# Patient Record
Sex: Male | Born: 1945 | Race: White | Hispanic: No | Marital: Married | Smoking: Former smoker
Health system: Southern US, Community
[De-identification: ages and names within clinical notes are randomized; demographics above are authoritative.]

## PROBLEM LIST (undated history)

## (undated) DIAGNOSIS — I739 Peripheral vascular disease, unspecified: Secondary | ICD-10-CM

## (undated) DIAGNOSIS — J449 Chronic obstructive pulmonary disease, unspecified: Secondary | ICD-10-CM

## (undated) DIAGNOSIS — I2510 Atherosclerotic heart disease of native coronary artery without angina pectoris: Secondary | ICD-10-CM

## (undated) DIAGNOSIS — L409 Psoriasis, unspecified: Secondary | ICD-10-CM

## (undated) DIAGNOSIS — E785 Hyperlipidemia, unspecified: Secondary | ICD-10-CM

## (undated) DIAGNOSIS — Z87891 Personal history of nicotine dependence: Secondary | ICD-10-CM

## (undated) HISTORY — DX: Personal history of nicotine dependence: Z87.891

## (undated) HISTORY — PX: CATARACT EXTRACTION, BILATERAL: SHX1313

## (undated) HISTORY — DX: Hyperlipidemia, unspecified: E78.5

## (undated) HISTORY — DX: Chronic obstructive pulmonary disease, unspecified: J44.9

## (undated) HISTORY — DX: Peripheral vascular disease, unspecified: I73.9

## (undated) HISTORY — DX: Atherosclerotic heart disease of native coronary artery without angina pectoris: I25.10

## (undated) HISTORY — PX: SPINE SURGERY: SHX786

## (undated) HISTORY — DX: Morbid (severe) obesity due to excess calories: E66.01

---

## 2004-12-07 ENCOUNTER — Ambulatory Visit (HOSPITAL_COMMUNITY): Admission: RE | Admit: 2004-12-07 | Discharge: 2004-12-07 | Payer: Self-pay | Admitting: Neurosurgery

## 2005-01-28 ENCOUNTER — Encounter: Admission: RE | Admit: 2005-01-28 | Discharge: 2005-01-28 | Payer: Self-pay | Admitting: Neurosurgery

## 2013-05-19 ENCOUNTER — Ambulatory Visit (INDEPENDENT_AMBULATORY_CARE_PROVIDER_SITE_OTHER): Payer: Medicare HMO | Admitting: Family Medicine

## 2013-05-19 VITALS — BP 129/79 | HR 79 | Temp 99.3°F | Ht 69.0 in | Wt 204.6 lb

## 2013-05-19 DIAGNOSIS — J329 Chronic sinusitis, unspecified: Secondary | ICD-10-CM

## 2013-05-19 MED ORDER — FLUTICASONE PROPIONATE 50 MCG/ACT NA SUSP: 2.0000 | Freq: Every day | NASAL | Status: DC

## 2013-05-19 MED ORDER — AZITHROMYCIN 250 MG PO TABS: ORAL_TABLET | ORAL | Status: DC

## 2013-05-19 NOTE — Progress Notes (Signed)
Patient ID: Joseph Shields, male   DOB: 1945/11/01, 68 y.o.   MRN: [id number] SUBJECTIVE: CC: Chief Complaint  Patient presents with  . Sinusitis    been going on for a week    HPI: 1 week of head congestion and pain in the paranasal sinuses.  History reviewed. No pertinent past medical history. Past Surgical History  Procedure Laterality Date  . Spine surgery     History   Social History  . Marital Status: Married    Spouse Name: N/A    Number of Children: N/A  . Years of Education: N/A   Occupational History  . Not on file.   Social History Main Topics  . Smoking status: Former Research scientist (life sciences)  . Smokeless tobacco: Not on file  . Alcohol Use: No  . Drug Use: No  . Sexual Activity: Not on file   Other Topics Concern  . Not on file   Social History Narrative  . No narrative on file   No family history on file. No current outpatient prescriptions on file prior to visit.   No current facility-administered medications on file prior to visit.   Allergies  Allergen Reactions  . Penicillins     There is no immunization history on file for this patient. Prior to Admission medications   Medication Sig Start Date End Date Taking? Authorizing Provider  aspirin 81 MG tablet Take 81 mg by mouth daily.   Yes Historical Provider, MD     ROS: As above in the HPI. All other systems are stable or negative.  OBJECTIVE: APPEARANCE:  Patient in no acute distress.The patient appeared well nourished and normally developed. Acyanotic. Waist: VITAL SIGNS:BP 129/79  Pulse 79  Temp(Src) 99.3 F (37.4 C) (Oral)  Ht 5\' 9"  (1.753 m)  Wt 204 lb 9.6 oz (92.806 kg)  BMI 30.20 kg/m2  WM SKIN: warm and  Dry without overt rashes, tattoos and scars  HEAD and Neck: without JVD, Head and scalp: normal Eyes:No scleral icterus. Fundi normal, eye movements normal. Ears: Auricle normal, canal normal, Tympanic membranes normal, insufflation normal. Nose: nasal congestion, paranasal  tenderness. Throat: normal Neck & thyroid: normal  CHEST & LUNGS: Chest wall: normal Lungs: Clear  CVS: Reveals the PMI to be normally located. Regular rhythm, First and Second Heart sounds are normal,  absence of murmurs, rubs or gallops. Peripheral vasculature: Radial pulses: normal Dorsal pedis pulses: normal Posterior pulses: normal  ABDOMEN:  Appearance: normal Benign, no organomegaly, no masses, no Abdominal Aortic enlargement. No Guarding , no rebound. No Bruits. Bowel sounds: normal  RECTAL: N/A GU: N/A  EXTREMETIES: nonedematous.  MUSCULOSKELETAL:  Spine: normal Joints: intact  NEUROLOGIC: oriented to time,place and person; nonfocal. Strength is normal Sensory is normal Reflexes are normal Cranial Nerves are normal.  ASSESSMENT: Sinusitis nasal  PLAN:  No orders of the defined types were placed in this encounter.   Meds ordered this encounter  Medications  . aspirin 81 MG tablet    Sig: Take 81 mg by mouth daily.  [name] azithromycin (ZITHROMAX) 250 MG tablet    Sig: 2 tabs on day 1 and 1 tab on days 2 to 5    Dispense:  6 each    Refill:  0  . fluticasone (FLONASE) 50 MCG/ACT nasal spray    Sig: Place 2 sprays into both nostrils daily.    Dispense:  16 g    Refill:  1   There are no discontinued medications. Return if symptoms worsen or fail  to improve.  Jamyla Ard P. Jacelyn Grip, M.D.

## 2013-05-25 ENCOUNTER — Ambulatory Visit: Payer: Self-pay | Admitting: Physician Assistant

## 2013-06-01 ENCOUNTER — Encounter: Payer: Self-pay | Admitting: Physician Assistant

## 2013-06-01 ENCOUNTER — Ambulatory Visit (INDEPENDENT_AMBULATORY_CARE_PROVIDER_SITE_OTHER): Payer: Medicare HMO | Admitting: Physician Assistant

## 2013-06-01 VITALS — BP 155/80 | HR 65 | Temp 98.2°F | Ht 69.0 in

## 2013-06-01 DIAGNOSIS — L989 Disorder of the skin and subcutaneous tissue, unspecified: Secondary | ICD-10-CM

## 2013-06-01 NOTE — Progress Notes (Signed)
   Subjective:    Patient ID: Joseph Shields, male    DOB: [birth year], 68 y.o.   MRN: [id number]  HPI 68 y/o male presents for removal of lesion on scalp   Review of Systems Negative for rash, ttp, bleeding and irritation of lesion.     Objective:   Physical Exam Wart like hyperpigmented rough lesion on scalp resembling seborrheic keratosis. Smaller seb k's noted on face. Clothed exam.       Assessment & Plan:  1. Shave biopsy of lesion to confirm dx . R/o BCC/SCC or underlying dysplasia. Referral to dermatology if needed. Instructed patient to apply vaseline for healing. Avoid neosporin or TAO. Clean dialy with soap/water.

## 2013-06-05 LAB — PATHOLOGY

## 2013-07-24 ENCOUNTER — Telehealth: Payer: Self-pay | Admitting: Physician Assistant

## 2013-07-25 ENCOUNTER — Encounter: Payer: Self-pay | Admitting: General Practice

## 2013-07-25 ENCOUNTER — Ambulatory Visit (INDEPENDENT_AMBULATORY_CARE_PROVIDER_SITE_OTHER): Payer: Medicare HMO | Admitting: General Practice

## 2013-07-25 VITALS — BP 150/85 | HR 64 | Temp 98.0°F | Ht 69.0 in | Wt 207.0 lb

## 2013-07-25 DIAGNOSIS — L409 Psoriasis, unspecified: Secondary | ICD-10-CM

## 2013-07-25 DIAGNOSIS — L259 Unspecified contact dermatitis, unspecified cause: Secondary | ICD-10-CM

## 2013-07-25 DIAGNOSIS — L309 Dermatitis, unspecified: Secondary | ICD-10-CM

## 2013-07-25 DIAGNOSIS — L408 Other psoriasis: Secondary | ICD-10-CM

## 2013-07-25 MED ORDER — DESOXIMETASONE 0.05 % EX CREA: TOPICAL_CREAM | Freq: Two times a day (BID) | CUTANEOUS | Status: DC

## 2013-07-25 MED ORDER — DESOXIMETASONE 0.05 % EX CREA: TOPICAL_CREAM | Freq: Two times a day (BID) | CUTANEOUS | Status: AC

## 2013-07-25 NOTE — Progress Notes (Signed)
   Subjective:    Patient ID: Joseph Shields, male    DOB: 1946-02-01, 68 y.o.   MRN: [id number]  HPI Patient presents today with complaints of worsening rash. History of psoriasis. Reports psoriasis has flared up and needs referral to another dermatologist. Due to change of insurance.     Review of Systems  Constitutional: Negative for fever and chills.  Respiratory: Negative for chest tightness and shortness of breath.   Cardiovascular: Negative for chest pain and palpitations.  Skin:       psoriasis       Objective:   Physical Exam  Constitutional: He is oriented to person, place, and time. He appears well-developed and well-nourished.  Cardiovascular: Normal rate, regular rhythm and normal heart sounds.   Pulmonary/Chest: Effort normal and breath sounds normal. No respiratory distress. He exhibits no tenderness.  Neurological: He is alert and oriented to person, place, and time.  Skin: Skin is warm and dry.  Silvery, white scales with erythematous base to abdomen and back. Negative drainage.   Psychiatric: He has a normal mood and affect.          Assessment & Plan:  1. Dermatitis   2. Psoriasis  - Ambulatory referral to Dermatology - desoximetasone (TOPICORT) 0.05 % cream; Apply topically 2 (two) times daily. Do not use on face, axilla, or groin  Dispense: 30 g; Refill: 1 -patient education discussed and provided -RTO prn Patient verbalized understanding Erby Pian, FNP-C

## 2013-07-25 NOTE — Patient Instructions (Signed)
Psoriasis Psoriasis is a common, long-lasting (chronic) inflammation of the skin. It affects both men and women equally, of all ages and all races. Psoriasis cannot be passed from person to person (not contagious). Psoriasis varies from mild to very severe. When severe, it can greatly affect your quality of life. Psoriasis is an inflammatory disorder affecting the skin as well as other organs including the joints (causing an arthritis). With psoriasis, the skin sheds its top layer of cells more rapidly than it does in someone without psoriasis. CAUSES  The cause of psoriasis is largely unknown. Genetics, your immune system, and the environment seem to play a role in causing psoriasis. Factors that can make psoriasis worse include:  Damage or trauma to the skin, such as cuts, scrapes, and sunburn. This damage often causes new areas of psoriasis (lesions).  Winter dryness and lack of sunlight.  Medicines such as lithium, beta-blockers, antimalarial drugs, ACE inhibitors, nonsteroidal anti-inflammatory drugs (ibuprofen, aspirin), and terbinafine. Let your caregiver know if you are taking any of these drugs.  Alcohol. Excessive alcohol use should be avoided if you have psoriasis. Drinking large amounts of alcohol can affect:  How well your psoriasis treatment works.  How safe your psoriasis treatment is.  Smoking. If you smoke, ask your caregiver for help to quit.  Stress.  Bacterial or viral infections.  Arthritis. Arthritis associated with psoriasis (psoriatic arthritis) affects less than 10% of patients with psoriasis. The arthritic intensity does not always match the skin psoriasis intensity. It is important to let your caregiver know if your joints hurt or if they are stiff. SYMPTOMS  The most common form of psoriasis begins with little red bumps that gradually become larger. The bumps begin to form scales that flake off easily. The lower layers of scales stick together. When these scales  are scratched or removed, the underlying skin is tender and bleeds easily. These areas then grow in size and may become large. Psoriasis often creates a rash that looks the same on both sides of the body (symmetrical). It often affects the elbows, knees, groin, genitals, arms, legs, scalp, and nails. Affected nails often have pitting, loosen, thicken, crumble, and are difficult to treat.  "Inverse psoriasis"occurs in the armpits, under breasts, in skin folds, and around the groin, buttocks, and genitals.  "Guttate psoriasis" generally occurs in children and young adults following a recent sore throat (strep throat). It begins with many small, red, scaly spots on the skin. It clears spontaneously in weeks or a few months without treatment. DIAGNOSIS  Psoriasis is diagnosed by physical exam. A tissue sample (biopsy) may also be taken. TREATMENT The treatment of psoriasis depends on your age, health, and living conditions.  Steroid (cortisone) creams, lotions, and ointments may be used. These treatments are associated with thinning of the skin, blood vessels that get larger (dilated), loss of skin pigmentation, and easy bruising. It is important to use these steroids as directed by your caregiver. Only treat the affected areas and not the normal, unaffected skin. People on long-term steroid treatment should wear a medical alert bracelet. Injections may be used in areas that are difficult to treat.  Scalp treatments are available as shampoos, solutions, sprays, foams, and oils. Avoid scratching the scalp and picking at the scales.  Anthralin medicine works well on areas that are difficult to treat. However, it stains clothes and skin and may cause temporary irritation.  Synthetic vitamin D (calcipotriene)can be used on small areas. It is available by prescription. The forms   of synthetic vitamin D available in health food stores do not help with psoriasis.  Coal tarsare available in various strengths  for psoriasis that is difficult to treat. They are one of the longest used treatments for difficult to treat psoriasis. However, they are messy to use.  Light therapy (UV therapy) can be carefully and professionally monitored in a dermatologist's office. Careful sunbathing is helpful for many people as directed by your caregiver. The exposure should be just long enough to cause a mild redness (erythema) of your skin. Avoid sunburn as this may make the condition worse. Sunscreen (SPF of 30 or higher) should be used to protect against sunburn. Cataracts, wrinkles, and skin aging are some of the harmful side effects of light therapy.  If creams (topical medicines) fail, there are several other options for systemic or oral medicines your caregiver can suggest. Psoriasis can sometimes be very difficult to treat. It can come and go. It is necessary to follow up with your caregiver regularly if your psoriasis is difficult to treat. Usually, with persistence you can get a good amount of relief. Maintaining consistent care is important. Do not change caregivers just because you do not see immediate results. It may take several trials to find the right combination of treatment for you. PREVENTING FLARE-UPS  Wear gloves while you wash dishes, while cleaning, and when you are outside in the cold.  If you have radiators, place a bowl of water or damp towel on the radiator. This will help put water back in the air. You can also use a humidifier to keep the air moist. Try to keep the humidity at about 60% in your home.  Apply moisturizer while your skin is still damp from bathing or showering. This traps water in the skin.  Avoid long, hot baths or showers. Keep soap use to a minimum. Soaps dry out the skin and wash away the protective oils. Use a fragrance free, dye free soap.  Drink enough water and fluids to keep your urine clear or pale yellow. Not drinking enough water depletes your skin's water  supply.  Turn off the heat at night and keep it low during the day. Cool air is less drying. SEEK MEDICAL CARE IF:  You have increasing pain in the affected areas.  You have uncontrolled bleeding in the affected areas.  You have increasing redness or warmth in the affected areas.  You start to have pain or stiffness in your joints.  You start feeling depressed about your condition.  You have a fever. Document Released: 04/09/2000 Document Revised: 07/05/2011 Document Reviewed: 10/05/2010 [employer] Patient Information 2014 [employer].  

## 2013-07-25 NOTE — Telephone Encounter (Signed)
APPT MADE

## 2014-02-19 ENCOUNTER — Ambulatory Visit (INDEPENDENT_AMBULATORY_CARE_PROVIDER_SITE_OTHER): Payer: Medicare HMO | Admitting: Family Medicine

## 2014-02-19 ENCOUNTER — Encounter: Payer: Self-pay | Admitting: Family Medicine

## 2014-02-19 ENCOUNTER — Ambulatory Visit (INDEPENDENT_AMBULATORY_CARE_PROVIDER_SITE_OTHER): Payer: Medicare HMO

## 2014-02-19 VITALS — BP 126/74 | HR 74 | Temp 97.3°F | Ht 69.0 in | Wt 200.0 lb

## 2014-02-19 DIAGNOSIS — R05 Cough: Secondary | ICD-10-CM

## 2014-02-19 DIAGNOSIS — J209 Acute bronchitis, unspecified: Secondary | ICD-10-CM

## 2014-02-19 DIAGNOSIS — J301 Allergic rhinitis due to pollen: Secondary | ICD-10-CM

## 2014-02-19 DIAGNOSIS — R059 Cough, unspecified: Secondary | ICD-10-CM

## 2014-02-19 DIAGNOSIS — L409 Psoriasis, unspecified: Secondary | ICD-10-CM

## 2014-02-19 DIAGNOSIS — J069 Acute upper respiratory infection, unspecified: Secondary | ICD-10-CM

## 2014-02-19 MED ORDER — AZITHROMYCIN 250 MG PO TABS: ORAL_TABLET | ORAL | Status: DC

## 2014-02-19 NOTE — Progress Notes (Signed)
Subjective:    Patient ID: Joseph Shields, male    DOB: 17-Jul-1945, 68 y.o.   MRN: [id number]  HPI Patient here today for cough, congestion and runny nose that started about 1 week ago. The patient is pleasant and cooperative and has a history of psoriasis and is taking methotrexate. The patient indicates that the congestion was yellow at first and more predominant in his head. The drainage has become more clear and seems to be down in his chest more with his coughing. When he has used Flonase in the past this  causes his nose to bleed.         There are no active problems to display for this patient.  Outpatient Encounter Prescriptions as of 02/19/2014  Medication Sig  . aspirin 81 MG tablet Take 81 mg by mouth daily.  . cetirizine (ZYRTEC) 10 MG tablet Take 10 mg by mouth daily.  . folic acid (FOLVITE) 1 MG tablet Take 1 mg by mouth daily.  . methotrexate (RHEUMATREX) 2.5 MG tablet Take 7.5 mg by mouth once a week.    Review of Systems  Constitutional: Negative.  Negative for fever.  HENT: Positive for congestion and postnasal drip. Negative for sore throat.   Eyes: Negative.   Respiratory: Positive for cough.   Cardiovascular: Negative.   Gastrointestinal: Negative.   Endocrine: Negative.   Genitourinary: Negative.   Musculoskeletal: Negative.  Negative for myalgias.  Skin: Negative.   Allergic/Immunologic: Negative.   Neurological: Negative.  Negative for headaches.  Hematological: Negative.   Psychiatric/Behavioral: Negative.        Objective:   Physical Exam  Nursing note and vitals reviewed. Constitutional: He is oriented to person, place, and time. He appears well-developed and well-nourished.  HENT:  Head: Normocephalic and atraumatic.  Right Ear: External ear normal.  Left Ear: External ear normal.  Mouth/Throat: Oropharynx is clear and moist. No oropharyngeal exudate.  Minimal nasal congestion.  Eyes: Conjunctivae and EOM are normal. Pupils are equal,  round, and reactive to light. Right eye exhibits no discharge. Left eye exhibits no discharge. No scleral icterus.  Neck: Normal range of motion. Neck supple. No thyromegaly present.  No anterior cervical adenopathy or carotid bruits.  Cardiovascular: Normal rate, regular rhythm and normal heart sounds.  Exam reveals no gallop and no friction rub.   No murmur heard. Pulmonary/Chest: Effort normal. No respiratory distress. He has wheezes. He has no rales.  A few wheezes bilaterally more expiratory in nature and worse on the right. Some chest congestion on the right with coughing but no rales or rhonchi.  Abdominal: Soft. Bowel sounds are normal. He exhibits no mass. There is no tenderness. There is no rebound and no guarding.  Musculoskeletal: Normal range of motion. He exhibits no edema.  Lymphadenopathy:    He has no cervical adenopathy.  Neurological: He is alert and oriented to person, place, and time.  Skin: Skin is warm and dry. Rash noted. No erythema. No pallor.  The patient does have some small patches of psoriasis evident on his back.  Psychiatric: He has a normal mood and affect. His behavior is normal. Judgment and thought content normal.   BP 126/74  Pulse 74  Temp(Src) 97.3 F (36.3 C) (Oral)  Ht 5\' 9"  (1.753 m)  Wt 200 lb (90.719 kg)  BMI 29.52 kg/m2  [facility] reading (PRIMARY) by  Dr. Brunilda Payor x-ray--  no active disease noted, degenerative changes in spine.  Assessment & Plan:  1. Cough - DG Chest 2 View; Future - azithromycin (ZITHROMAX) 250 MG tablet; 2 pills the first day then 1 daily until completed  Dispense: 6 tablet; Refill: 0  2. URI (upper respiratory infection) - azithromycin (ZITHROMAX) 250 MG tablet; 2 pills the first day then 1 daily until completed  Dispense: 6 tablet; Refill: 0  3. Allergic rhinitis due to pollen  4. Psoriasis  5. Acute bronchitis, unspecified organism - azithromycin (ZITHROMAX) 250 MG tablet;  2 pills the first day then 1 daily until completed  Dispense: 6 tablet; Refill: 0  Patient Instructions  Drink plenty of fluids Use saline nose spray 3 or 4 times daily to each nostril Take Mucinex maximum strength, Lou and white in color, over-the-counter, 1 twice daily with a large glass of water Keep the home cooler in temperature Take antibiotic as directed   Arrie Senate MD

## 2014-02-19 NOTE — Patient Instructions (Addendum)
Drink plenty of fluids Use saline nose spray 3 or 4 times daily to each nostril Take Mucinex maximum strength, Lou and white in color, over-the-counter, 1 twice daily with a large glass of water Keep the home cooler in temperature Take antibiotic as directed  Call Roselyn Reef 1st of next week - to see how you're doing- if no better- we may start prednisone.

## 2014-02-20 ENCOUNTER — Telehealth: Payer: Self-pay

## 2014-02-20 LAB — CBC WITH DIFFERENTIAL
Basophils Absolute: 0 10*3/uL (ref 0.0–0.2)
Basos: 1 %
Eos: 4 %
Eosinophils Absolute: 0.3 10*3/uL (ref 0.0–0.4)
HCT: 41.4 % (ref 37.5–51.0)
Hemoglobin: 13.8 g/dL (ref 12.6–17.7)
Immature Grans (Abs): 0 10*3/uL (ref 0.0–0.1)
Immature Granulocytes: 0 %
Lymphocytes Absolute: 1.9 10*3/uL (ref 0.7–3.1)
Lymphs: 23 %
MCH: 31.8 pg (ref 26.6–33.0)
MCHC: 33.3 g/dL (ref 31.5–35.7)
MCV: 95 fL (ref 79–97)
Monocytes Absolute: 1.2 10*3/uL — ABNORMAL HIGH (ref 0.1–0.9)
Monocytes: 15 %
Neutrophils Absolute: 4.6 10*3/uL (ref 1.4–7.0)
Neutrophils Relative %: 57 %
Platelets: 236 10*3/uL (ref 150–379)
RBC: 4.34 x10E6/uL (ref 4.14–5.80)
RDW: 14.3 % (ref 12.3–15.4)
WBC: 8 10*3/uL (ref 3.4–10.8)

## 2014-02-20 NOTE — Telephone Encounter (Signed)
Message copied by Koren Bound on Wed Feb 20, 2014  8:14 AM ------      Message from: Chipper Herb      Created: Tue Feb 19, 2014  2:34 PM       As per radiology report ------

## 2014-02-20 NOTE — Telephone Encounter (Signed)
Pt aware of CXR results.

## 2014-04-09 ENCOUNTER — Other Ambulatory Visit: Payer: Self-pay | Admitting: Family Medicine

## 2014-04-09 NOTE — Telephone Encounter (Signed)
Last seen 02/19/14  DWM

## 2014-04-11 ENCOUNTER — Encounter: Payer: Self-pay | Admitting: Nurse Practitioner

## 2014-04-11 ENCOUNTER — Ambulatory Visit (INDEPENDENT_AMBULATORY_CARE_PROVIDER_SITE_OTHER): Payer: Medicare HMO | Admitting: Nurse Practitioner

## 2014-04-11 ENCOUNTER — Telehealth: Payer: Self-pay | Admitting: Family Medicine

## 2014-04-11 VITALS — BP 127/76 | HR 95 | Temp 100.8°F | Ht 66.0 in | Wt 197.0 lb

## 2014-04-11 DIAGNOSIS — J189 Pneumonia, unspecified organism: Secondary | ICD-10-CM

## 2014-04-11 DIAGNOSIS — J069 Acute upper respiratory infection, unspecified: Secondary | ICD-10-CM

## 2014-04-11 MED ORDER — AZITHROMYCIN 250 MG PO TABS: ORAL_TABLET | ORAL | Status: DC

## 2014-04-11 MED ORDER — HYDROCODONE-HOMATROPINE 5-1.5 MG/5ML PO SYRP: 5.0000 mL | ORAL_SOLUTION | Freq: Three times a day (TID) | ORAL | Status: DC | PRN

## 2014-04-11 NOTE — Progress Notes (Signed)
Subjective:    Patient ID: Joseph Shields, male    DOB: June 22, 1945, 68 y.o.   MRN: [id number]  HPI patient in c/o cough, chest congestion and fever that started 3 days ago. He has tried oTC meds with  No relief.    Review of Systems  Constitutional: Positive for fever and chills. Negative for appetite change.  HENT: Positive for congestion, rhinorrhea and sinus pressure. Negative for ear pain.   Respiratory: Positive for cough (productive).   Cardiovascular: Negative.   Gastrointestinal: Negative.   Genitourinary: Negative.   Neurological: Negative.   Psychiatric/Behavioral: Negative.   All other systems reviewed and are negative.      Objective:   Physical Exam  Constitutional: He is oriented to person, place, and time. He appears well-developed and well-nourished. No distress.  HENT:  Right Ear: Hearing, tympanic membrane, external ear and ear canal normal.  Left Ear: Hearing, tympanic membrane, external ear and ear canal normal.  Nose: Mucosal edema and rhinorrhea present. Right sinus exhibits no maxillary sinus tenderness and no frontal sinus tenderness. Left sinus exhibits no maxillary sinus tenderness and no frontal sinus tenderness.  Mouth/Throat: Uvula is midline, oropharynx is clear and moist and mucous membranes are normal.  Eyes: Pupils are equal, round, and reactive to light.  Neck: Normal range of motion. Neck supple.  Cardiovascular: Normal rate and normal heart sounds.   Pulmonary/Chest: Effort normal. He has rales (right lower lobe).  Diminished breath sounds right lower lobe  Abdominal: Soft. Bowel sounds are normal.  Lymphadenopathy:    He has no cervical adenopathy.  Neurological: He is alert and oriented to person, place, and time.  Skin: Skin is warm.  Psychiatric: He has a normal mood and affect. His behavior is normal. Judgment and thought content normal.    BP 127/76 mmHg  Pulse 95  Temp(Src) 100.8 F (38.2 C) (Oral)  Ht 5\' 6"  (1.676 m)  Wt 197  lb (89.359 kg)  BMI 31.81 kg/m2       Assessment & Plan:   1. Acute upper respiratory infection   2. Pneumonia, organism unspecified    Meds ordered this encounter  Medications  . azithromycin (ZITHROMAX Z-PAK) 250 MG tablet    Sig: As directed    Dispense:  6 each    Refill:  0    Order Specific Question:  Supervising Provider    Answer:  Chipper Herb [[id number]]  . HYDROcodone-homatropine (HYCODAN) 5-1.5 MG/5ML syrup    Sig: Take 5 mLs by mouth every 8 (eight) hours as needed for cough.    Dispense:  120 mL    Refill:  0    Order Specific Question:  Supervising Provider    Answer:  Chipper Herb [[id number]]   1. Take meds as prescribed 2. Use a cool mist humidifier especially during the winter months and when heat has been humid. 3. Use saline nose sprays frequently 4. Saline irrigations of the nose can be very helpful if done frequently.  * 4X daily for 1 week*  * Use of a nettie pot can be helpful with this. Follow directions with this* 5. Drink plenty of fluids 6. Keep thermostat turn down low 7.For any cough or congestion  Use plain Mucinex- regular strength or max strength is fine   * Children- consult with Pharmacist for dosing 8. For fever or aces or pains- take tylenol or ibuprofen appropriate for age and weight.  * for fevers greater than 101 orally you may alternate  ibuprofen and tylenol every  3 hours.   Mary-Margaret Hassell Done, FNP

## 2014-04-11 NOTE — Telephone Encounter (Signed)
Appointment given for tonight at 6:15 with Ronnald Collum, FNP

## 2014-04-11 NOTE — Patient Instructions (Signed)

## 2014-04-13 ENCOUNTER — Emergency Department (HOSPITAL_COMMUNITY): Payer: Medicare HMO

## 2014-04-13 ENCOUNTER — Encounter (HOSPITAL_COMMUNITY): Payer: Self-pay | Admitting: *Deleted

## 2014-04-13 ENCOUNTER — Inpatient Hospital Stay (HOSPITAL_COMMUNITY)
Admission: EM | Admit: 2014-04-13 | Discharge: 2014-04-15 | DRG: 871 | Disposition: A | Discharge status: Home / Self Care | Payer: Medicare HMO | Attending: Internal Medicine | Admitting: Internal Medicine

## 2014-04-13 DIAGNOSIS — J440 Chronic obstructive pulmonary disease with acute lower respiratory infection: Secondary | ICD-10-CM | POA: Diagnosis present

## 2014-04-13 DIAGNOSIS — J189 Pneumonia, unspecified organism: Secondary | ICD-10-CM | POA: Diagnosis present

## 2014-04-13 DIAGNOSIS — A419 Sepsis, unspecified organism: Principal | ICD-10-CM | POA: Diagnosis present

## 2014-04-13 DIAGNOSIS — Z792 Long term (current) use of antibiotics: Secondary | ICD-10-CM

## 2014-04-13 DIAGNOSIS — R059 Cough, unspecified: Secondary | ICD-10-CM

## 2014-04-13 DIAGNOSIS — J129 Viral pneumonia, unspecified: Secondary | ICD-10-CM | POA: Diagnosis present

## 2014-04-13 DIAGNOSIS — L409 Psoriasis, unspecified: Secondary | ICD-10-CM | POA: Diagnosis present

## 2014-04-13 DIAGNOSIS — R7989 Other specified abnormal findings of blood chemistry: Secondary | ICD-10-CM

## 2014-04-13 DIAGNOSIS — R0602 Shortness of breath: Secondary | ICD-10-CM

## 2014-04-13 DIAGNOSIS — I214 Non-ST elevation (NSTEMI) myocardial infarction: Secondary | ICD-10-CM | POA: Diagnosis present

## 2014-04-13 DIAGNOSIS — F17210 Nicotine dependence, cigarettes, uncomplicated: Secondary | ICD-10-CM | POA: Diagnosis present

## 2014-04-13 DIAGNOSIS — E872 Acidosis: Secondary | ICD-10-CM | POA: Diagnosis present

## 2014-04-13 DIAGNOSIS — I10 Essential (primary) hypertension: Secondary | ICD-10-CM | POA: Diagnosis present

## 2014-04-13 DIAGNOSIS — J209 Acute bronchitis, unspecified: Secondary | ICD-10-CM | POA: Diagnosis present

## 2014-04-13 DIAGNOSIS — R0902 Hypoxemia: Secondary | ICD-10-CM | POA: Diagnosis present

## 2014-04-13 DIAGNOSIS — R6510 Systemic inflammatory response syndrome (SIRS) of non-infectious origin without acute organ dysfunction: Secondary | ICD-10-CM | POA: Insufficient documentation

## 2014-04-13 DIAGNOSIS — J9601 Acute respiratory failure with hypoxia: Secondary | ICD-10-CM | POA: Diagnosis present

## 2014-04-13 DIAGNOSIS — Z7982 Long term (current) use of aspirin: Secondary | ICD-10-CM

## 2014-04-13 DIAGNOSIS — R05 Cough: Secondary | ICD-10-CM | POA: Insufficient documentation

## 2014-04-13 DIAGNOSIS — R778 Other specified abnormalities of plasma proteins: Secondary | ICD-10-CM | POA: Diagnosis present

## 2014-04-13 DIAGNOSIS — Z880 Allergy status to penicillin: Secondary | ICD-10-CM

## 2014-04-13 DIAGNOSIS — J441 Chronic obstructive pulmonary disease with (acute) exacerbation: Secondary | ICD-10-CM | POA: Diagnosis present

## 2014-04-13 HISTORY — DX: Psoriasis, unspecified: L40.9

## 2014-04-13 LAB — CK TOTAL AND CKMB (NOT AT ARMC)
CK TOTAL: 428 U/L — AB (ref 7–232)
CK, MB: 29 ng/mL — AB (ref 0.3–4.0)
Relative Index: 6.8 — ABNORMAL HIGH (ref 0.0–2.5)

## 2014-04-13 LAB — CBC WITH DIFFERENTIAL/PLATELET
Basophils Absolute: 0 10*3/uL (ref 0.0–0.1)
Basophils Relative: 0 % (ref 0–1)
Eosinophils Absolute: 0.1 10*3/uL (ref 0.0–0.7)
Eosinophils Relative: 1 % (ref 0–5)
HCT: 39.6 % (ref 39.0–52.0)
Hemoglobin: 13.1 g/dL (ref 13.0–17.0)
Lymphocytes Relative: 6 % — ABNORMAL LOW (ref 12–46)
Lymphs Abs: 1.2 10*3/uL (ref 0.7–4.0)
MCH: 31.3 pg (ref 26.0–34.0)
MCHC: 33.1 g/dL (ref 30.0–36.0)
MCV: 94.7 fL (ref 78.0–100.0)
Monocytes Absolute: 4.1 10*3/uL — ABNORMAL HIGH (ref 0.1–1.0)
Monocytes Relative: 20 % — ABNORMAL HIGH (ref 3–12)
Neutro Abs: 14.9 10*3/uL — ABNORMAL HIGH (ref 1.7–7.7)
Neutrophils Relative %: 73 % (ref 43–77)
Platelets: 254 10*3/uL (ref 150–400)
RBC: 4.18 MIL/uL — ABNORMAL LOW (ref 4.22–5.81)
RDW: 13.4 % (ref 11.5–15.5)
WBC: 20.3 10*3/uL — ABNORMAL HIGH (ref 4.0–10.5)

## 2014-04-13 LAB — COMPREHENSIVE METABOLIC PANEL
ALT: 23 U/L (ref 0–53)
AST: 28 U/L (ref 0–37)
Albumin: 3.3 g/dL — ABNORMAL LOW (ref 3.5–5.2)
Alkaline Phosphatase: 90 U/L (ref 39–117)
Anion gap: 14 (ref 5–15)
BUN: 17 mg/dL (ref 6–23)
CO2: 25 mEq/L (ref 19–32)
Calcium: 9.4 mg/dL (ref 8.4–10.5)
Chloride: 99 mEq/L (ref 96–112)
Creatinine, Ser: 0.87 mg/dL (ref 0.50–1.35)
GFR calc Af Amer: >90 mL/min (ref >90)
GFR calc non Af Amer: 87 mL/min — ABNORMAL LOW (ref >90)
Glucose, Bld: 113 mg/dL — ABNORMAL HIGH (ref 70–99)
Potassium: 4.2 mEq/L (ref 3.7–5.3)
Sodium: 138 mEq/L (ref 137–147)
Total Bilirubin: 0.5 mg/dL (ref 0.3–1.2)
Total Protein: 7.2 g/dL (ref 6.0–8.3)

## 2014-04-13 LAB — TROPONIN I
Troponin I: 3.79 ng/mL (ref <0.30)
Troponin I: 6.93 ng/mL (ref <0.30)

## 2014-04-13 MED ORDER — HEPARIN BOLUS VIA INFUSION
4000.0000 [IU] | Freq: Once | INTRAVENOUS | Status: AC
  Administered 2014-04-13: 4000 [IU] via INTRAVENOUS
  Filled 2014-04-13: qty 4000

## 2014-04-13 MED ORDER — ASPIRIN 325 MG PO TABS
325.0000 mg | ORAL_TABLET | ORAL | Status: AC
  Administered 2014-04-13: 325 mg via ORAL
  Filled 2014-04-13: qty 1

## 2014-04-13 MED ORDER — DEXTROSE 5 % IV SOLN
1.0000 g | Freq: Once | INTRAVENOUS | Status: AC
  Administered 2014-04-13: 1 g via INTRAVENOUS
  Filled 2014-04-13: qty 10

## 2014-04-13 MED ORDER — DEXTROSE 5 % IV SOLN
500.0000 mg | Freq: Once | INTRAVENOUS | Status: AC
  Administered 2014-04-13: 500 mg via INTRAVENOUS
  Filled 2014-04-13: qty 500

## 2014-04-13 MED ORDER — ALBUTEROL SULFATE (2.5 MG/3ML) 0.083% IN NEBU
5.0000 mg | INHALATION_SOLUTION | Freq: Once | RESPIRATORY_TRACT | Status: AC
  Administered 2014-04-13: 5 mg via RESPIRATORY_TRACT
  Filled 2014-04-13: qty 6

## 2014-04-13 MED ORDER — IOHEXOL 350 MG/ML SOLN
100.0000 mL | Freq: Once | INTRAVENOUS | Status: AC | PRN
  Administered 2014-04-13: 100 mL via INTRAVENOUS

## 2014-04-13 MED ORDER — HEPARIN (PORCINE) IN NACL 100-0.45 UNIT/ML-% IJ SOLN
1400.0000 [IU]/h | INTRAMUSCULAR | Status: DC
  Administered 2014-04-13: 1000 [IU]/h via INTRAVENOUS
  Administered 2014-04-14: 1400 [IU]/h via INTRAVENOUS
  Filled 2014-04-13 (×2): qty 250

## 2014-04-13 NOTE — ED Provider Notes (Signed)
CSN: [id number]     Arrival date & time 04/13/14  1709 History   First MD Initiated Contact with Patient 04/13/14 1716     Chief Complaint  Patient presents with  . Shortness of Breath  . Cough     (Consider location/radiation/quality/duration/timing/severity/associated sxs/prior Treatment) Patient is a 68 y.o. male presenting with shortness of breath and cough. The history is provided by the patient.  Shortness of Breath Severity:  Mild Onset quality:  Gradual Duration:  1 week Timing:  Constant Progression:  Worsening Chronicity:  New Context: URI   Relieved by:  Nothing Worsened by:  Nothing tried Ineffective treatments:  None tried Associated symptoms: cough   Associated symptoms: no abdominal pain, no chest pain, no fever, no headaches, no neck pain and no vomiting   Cough:    Cough characteristics:  Productive   Sputum characteristics:  Yellow   Severity:  Moderate   Onset quality:  Gradual   Duration:  1 week   Timing:  Constant   Progression:  Worsening   Chronicity:  New Cough Associated symptoms: shortness of breath   Associated symptoms: no chest pain, no fever, no headaches and no rhinorrhea     History reviewed. No pertinent past medical history. Past Surgical History  Procedure Laterality Date  . Spine surgery     History reviewed. No pertinent family history. History  Substance Use Topics  . Smoking status: Former Research scientist (life sciences)  . Smokeless tobacco: Not on file  . Alcohol Use: No    Review of Systems  Constitutional: Negative for fever.  HENT: Negative for drooling and rhinorrhea.   Eyes: Negative for pain.  Respiratory: Positive for cough and shortness of breath.   Cardiovascular: Negative for chest pain and leg swelling.  Gastrointestinal: Negative for nausea, vomiting, abdominal pain and diarrhea.  Genitourinary: Negative for dysuria and hematuria.  Musculoskeletal: Negative for gait problem and neck pain.  Skin: Negative for color change.   Neurological: Negative for numbness and headaches.  Hematological: Negative for adenopathy.  Psychiatric/Behavioral: Negative for behavioral problems.  All other systems reviewed and are negative.     Allergies  Flonase and Penicillins  Home Medications   Prior to Admission medications   Medication Sig Start Date End Date Taking? Authorizing Provider  aspirin 81 MG tablet Take 81 mg by mouth daily.    Historical Provider, MD  azithromycin (ZITHROMAX Z-PAK) 250 MG tablet As directed 04/11/14   Mary-Margaret Hassell Done, FNP  azithromycin (ZITHROMAX) 250 MG tablet 2 pills the first day then 1 daily until completed Patient not taking: Reported on 04/11/2014 02/19/14   Chipper Herb, MD  cetirizine (ZYRTEC) 10 MG tablet Take 10 mg by mouth daily.    Historical Provider, MD  folic acid (FOLVITE) 1 MG tablet Take 1 mg by mouth daily.    Historical Provider, MD  HYDROcodone-homatropine (HYCODAN) 5-1.5 MG/5ML syrup Take 5 mLs by mouth every 8 (eight) hours as needed for cough. 04/11/14   Mary-Margaret Hassell Done, FNP  methotrexate (RHEUMATREX) 2.5 MG tablet Take 7.5 mg by mouth once a week.    Historical Provider, MD  methotrexate (RHEUMATREX) 2.5 MG tablet  04/01/14   Historical Provider, MD   BP 131/69 mmHg  Pulse 103  Temp(Src) 98.2 F (36.8 C) (Oral)  Resp 22  SpO2 94% Physical Exam  Constitutional: He is oriented to person, place, and time. He appears well-developed and well-nourished.  HENT:  Head: Normocephalic and atraumatic.  Right Ear: External ear normal.  Left Ear: External  ear normal.  Nose: Nose normal.  Mouth/Throat: Oropharynx is clear and moist. No oropharyngeal exudate.  Eyes: Conjunctivae and EOM are normal. Pupils are equal, round, and reactive to light.  Neck: Normal range of motion. Neck supple.  Cardiovascular: Normal rate, regular rhythm, normal heart sounds and intact distal pulses.  Exam reveals no gallop and no friction rub.   No murmur heard. Pulmonary/Chest:  He has no wheezes.  Mild tachypnea. Diminished breath sounds bilaterally. No obvious wheezing heard.  Abdominal: Soft. Bowel sounds are normal. He exhibits no distension. There is no tenderness. There is no rebound and no guarding.  Musculoskeletal: Normal range of motion. He exhibits no edema or tenderness.  Neurological: He is alert and oriented to person, place, and time.  Skin: Skin is warm and dry.  Psychiatric: He has a normal mood and affect. His behavior is normal.  Nursing note and vitals reviewed.   ED Course  Procedures (including critical care time) Labs Review Labs Reviewed  CBC WITH DIFFERENTIAL - Abnormal; Notable for the following:    WBC 20.3 (*)    RBC 4.18 (*)    Neutro Abs 14.9 (*)    Lymphocytes Relative 6 (*)    Monocytes Relative 20 (*)    Monocytes Absolute 4.1 (*)    All other components within normal limits  COMPREHENSIVE METABOLIC PANEL - Abnormal; Notable for the following:    Glucose, Bld 113 (*)    Albumin 3.3 (*)    GFR calc non Af Amer 87 (*)    All other components within normal limits  TROPONIN I - Abnormal; Notable for the following:    Troponin I 3.79 (*)    All other components within normal limits  HEPARIN LEVEL (UNFRACTIONATED) - Abnormal; Notable for the following:    Heparin Unfractionated <0.10 (*)    All other components within normal limits  CBC - Abnormal; Notable for the following:    WBC 18.2 (*)    RBC 4.04 (*)    All other components within normal limits  TROPONIN I - Abnormal; Notable for the following:    Troponin I 6.93 (*)    All other components within normal limits  CK TOTAL AND CKMB - Abnormal; Notable for the following:    Total CK 428 (*)    CK, MB 29.0 (*)    Relative Index 6.8 (*)    All other components within normal limits  PRO B NATRIURETIC PEPTIDE - Abnormal; Notable for the following:    Pro B Natriuretic peptide (BNP) 1376.0 (*)    All other components within normal limits  TROPONIN I - Abnormal; Notable  for the following:    Troponin I 6.16 (*)    All other components within normal limits  TROPONIN I - Abnormal; Notable for the following:    Troponin I 6.38 (*)    All other components within normal limits  BLOOD GAS, ARTERIAL - Abnormal; Notable for the following:    pH, Arterial 7.346 (*)    pCO2 arterial 46.9 (*)    Bicarbonate 25.0 (*)    All other components within normal limits  COMPREHENSIVE METABOLIC PANEL - Abnormal; Notable for the following:    Glucose, Bld 146 (*)    Albumin 2.9 (*)    AST 39 (*)    All other components within normal limits  MRSA PCR SCREENING  CULTURE, EXPECTORATED SPUTUM-ASSESSMENT  CULTURE, EXPECTORATED SPUTUM-ASSESSMENT  CULTURE, BLOOD (ROUTINE X 2)  CULTURE, BLOOD (ROUTINE X 2)  RESPIRATORY  VIRUS PANEL  RAPID STREP SCREEN  CULTURE, RESPIRATORY (NON-EXPECTORATED)  STREP PNEUMONIAE URINARY ANTIGEN  INFLUENZA PANEL BY PCR (TYPE A & B, H1N1)  HIV ANTIBODY (ROUTINE TESTING)  LEGIONELLA ANTIGEN, URINE  TROPONIN I  HEPARIN LEVEL (UNFRACTIONATED)    Imaging Review Dg Chest 2 View  04/13/2014   CLINICAL DATA:  Cough for 1 week.  Low-grade fever  EXAM: CHEST  2 VIEW  COMPARISON:  02/19/2014  FINDINGS: Normal mediastinum and cardiac silhouette. Some linear opacity at the left right lung base. No pleural fluid or focal consolidation. Lungs are hyperinflated. Anterior cervical fusion noted.  IMPRESSION: Increased linear densities at lung bases suggest bronchitis.   Electronically Signed   By: Suzy Bouchard M.D.   On: 04/13/2014 19:13   Ct Angio Chest Pe W/cm &/or Wo Cm  04/13/2014   CLINICAL DATA:  Short of breath 1 week. Cough. Concern for pulmonary embolism  EXAM: CT ANGIOGRAPHY CHEST WITH CONTRAST  TECHNIQUE: Multidetector CT imaging of the chest was performed using the standard protocol during bolus administration of intravenous contrast. Multiplanar CT image reconstructions and MIPs were obtained to evaluate the vascular anatomy.  CONTRAST:  119mL  OMNIPAQUE IOHEXOL 350 MG/ML SOLN  COMPARISON:  Radiograph 04/13/2014  FINDINGS: There are no filling defects within the pulmonary arteries to suggest acute pulmonary embolism. No acute findings of the aorta great vessels. No pericardial fluid. Coronary calcifications are noted. Esophagus is normal.  Review of the lung parenchyma demonstrates mild linear nodular branching in the upper lobes. There is more defined branching nodular pattern in the inferior right lower lobe (image 69) and lingula. Airways are normal.  No axillary or supraclavicular lymphadenopathy. No mediastinal hilar lymphadenopathy.  Limited upper abdomen demonstrates normal adrenal glands. Limited view of the skeleton demonstrates no aggressive osseous lesion.  Review of the MIP images confirms the above findings.  IMPRESSION: 1. No evidence acute pulmonary embolism. 2. Branching nodular pattern in the inferior lingula and lateral right lower lobe and to less extent upper lobes consistent with inflammatory or infectious process. 3. Coronary calcifications.   Electronically Signed   By: Suzy Bouchard M.D.   On: 04/13/2014 20:43     EKG Interpretation   Date/Time:  Saturday April 13 2014 17:13:59 EST Ventricular Rate:  101 PR Interval:  134 QRS Duration: 90 QT Interval:  306 QTC Calculation: 396 R Axis:   23 Text Interpretation:  Sinus tachycardia Nonspecific ST abnormality  Abnormal ECG Confirmed by Maximo Spratling  MD, Dayzee Trower ([id number]) on 04/13/2014  5:36:09 PM     CRITICAL CARE Performed by: Pamella Pert, S Total critical care time: 30 min Critical care time was exclusive of separately billable procedures and treating other patients. Critical care was necessary to treat or prevent imminent or life-threatening deterioration. Critical care was time spent personally by me on the following activities: development of treatment plan with patient and/or surrogate as well as nursing, discussions with consultants, evaluation of  patient's response to treatment, examination of patient, obtaining history from patient or surrogate, ordering and performing treatments and interventions, ordering and review of laboratory studies, ordering and review of radiographic studies, pulse oximetry and re-evaluation of patient's condition.  MDM   Final diagnoses:  SOB (shortness of breath)  Elevated troponin  Hypoxia  Cough  NSTEMI (non-ST elevated myocardial infarction)    6:20 PM 68 y.o. male smoker who presents with cough productive of yellow sputum and shortness of breath for one week. He has had low-grade fevers at home slightly above 100.0.  He denies any chest pain. He was seen by his PCP 2 days ago who prescribed him cough syrup and a Z-Pak. He notes no significant relief and presents now for evaluation. He appears to have an oxygen saturation of 89% on room air. He was placed on 2 L via nasal cannula. Vital signs are otherwise unremarkable. We'll get screening labs and imaging. Diminished BS, no obvious wheezing initially. Pt is a smoker, will give alb neb and re-evaluate.   7:44 PM found to have elevated troponin. The patient continues to deny any chest pain. Will get CTA to rule out PE given hypoxia.  Cardiology consulted. Dr. Radford Pax to have tech come in and perform echocardiogram. Plan for admission to hospitalist. I did cover the pt for CAP w/ rocephin/azithro. Pt was also heparinized and given ASA.   Pamella Pert, MD 04/14/14 [phone]

## 2014-04-13 NOTE — ED Notes (Addendum)
Pt's sats 87% on RA, and only 89% on 2L. Pt placed on 3L of O2, and is satting 92%.

## 2014-04-13 NOTE — ED Notes (Signed)
Critical lab reported to Dr. Aline Brochure

## 2014-04-13 NOTE — ED Notes (Signed)
Reports having sob and productive cough for several days, reports coughing up yellow sputum and has low grade fever. Airway intact at triage, ekg done.

## 2014-04-13 NOTE — Progress Notes (Signed)
ANTICOAGULATION CONSULT NOTE - Initial Consult  Pharmacy Consult for heparin Indication: chest pain/ACS  Allergies  Allergen Reactions  . Flonase [Fluticasone Propionate]     Nose bleeds.  . Penicillins     Patient Measurements: Height: 5\' 6"  (167.6 cm) Weight: 197 lb 1.5 oz (89.4 kg) IBW/kg (Calculated) : 63.8 Heparin Dosing Weight: 85kg  Vital Signs: Temp: 98.2 F (36.8 C) (12/19 1716) Temp Source: Oral (12/19 1716) BP: 112/82 mmHg (12/19 1915) Pulse Rate: 102 (12/19 1915)  Labs:  Recent Labs  04/13/14 1823  HGB 13.1  HCT 39.6  PLT 254  CREATININE 0.87  TROPONINI 3.79*    Estimated Creatinine Clearance: 86.2 mL/min (by C-G formula based on Cr of 0.87).   Medical History: History reviewed. No pertinent past medical history.  Assessment: 68 year old male presents to [facility] with shortness of breath and cough. Patient now with positive troponin of 3.79. New orders to start IV heparin, patient does not appear to be on any anticoagulants prior to admission. Baseline cbc is within normal limits.   Goal of Therapy:  Heparin level 0.3-0.7 units/ml Monitor platelets by anticoagulation protocol: Yes   Plan:  Give 4000 units bolus x 1 Start heparin infusion at 1000 units/hr Check anti-Xa level in 6 hours and daily while on heparin Continue to monitor H&H and platelets  Erin Hearing PharmD., BCPS Clinical Pharmacist Pager [phone] 04/13/2014 7:50 PM

## 2014-04-13 NOTE — Consult Note (Addendum)
Admit date: 04/13/2014 Referring Physician  Dr. Aline Brochure Primary Physician  Dr. Jacelyn Grip Primary Cardiologist  NONE Reason for Consultation  Elevated troponin  HPI: This is a 68yo WM with a history of psoriasis on methotrexate who was recently seen by his PCP on 12/17 with complaints of cough, chest congestion and fever that started about 5 days ago.  On exam at that time he had rales at the RLL along with diminished breath sounds along with mucosal edema and rhinorrhea.  He was diagnosed with a URI and started on Azithromycin and Hycodan.  He did not get any better and he presented to the ER for further evaluation.  He is afebrile but WBC elevated at 20K.  He was hypoxic in the ER with O2 sats as low as 87% and a chest CT was done which showed no evidence acute pulmonary embolism but did show a branching nodular pattern in the inferior lingula and lateral right lower lobe and to less extent upper lobes consistent with inflammatory or infectious process as well as coronary calcifications.  A troponin was checked which was elevated at 3.79 and repeat increased to 6.93.  He denies any chest pain or pressure but complains of SOB.  Cardiology is now asked to consult for further evaluation.  AST and ALT are normal.  EKG showed sinus tachyardia with nonspecific ST abnormality.     PMH:   Past Medical History  Diagnosis Date  . Psoriasis      PSH:   Past Surgical History  Procedure Laterality Date  . Spine surgery      Allergies:  Flonase and Penicillins Prior to Admit Meds:   (Not in a hospital admission) Fam HX:   History reviewed. No pertinent family history. Social HX:    History   Social History  . Marital Status: Married    Spouse Name: N/A    Number of Children: N/A  . Years of Education: N/A   Occupational History  . Not on file.   Social History Main Topics  . Smoking status: Former Research scientist (life sciences)  . Smokeless tobacco: Not on file  . Alcohol Use: No  . Drug Use: No  . Sexual  Activity: Not on file   Other Topics Concern  . Not on file   Social History Narrative     ROS:  All 11 ROS were addressed and are negative except what is stated in the HPI  Physical Exam: Blood pressure 116/69, pulse 86, temperature 98.2 F (36.8 C), temperature source Oral, resp. rate 26, height 5\' 6"  (1.676 m), weight 197 lb 1.5 oz (89.4 kg), SpO2 97 %.    General: Well developed, well nourished, in no acute distress Head: Eyes PERRLA, No xanthomas.   Normal cephalic and atramatic  Lungs:   Clear bilaterally to auscultation and percussion. Heart:   HRRR S1 S2 Pulses are 2+ & equal.            No carotid bruit. No JVD.  No abdominal bruits. No femoral bruits. Abdomen: Bowel sounds are positive, abdomen soft and non-tender without masses Extremities:   No clubbing, cyanosis or edema.  DP +1 Neuro: Alert and oriented X 3. Psych:  Good affect, responds appropriately    Labs:   Lab Results  Component Value Date   WBC 20.3* 04/13/2014   HGB 13.1 04/13/2014   HCT 39.6 04/13/2014   MCV 94.7 04/13/2014   PLT 254 04/13/2014     Recent Labs Lab 04/13/14 1823  NA  138  K 4.2  CL 99  CO2 25  BUN 17  CREATININE 0.87  CALCIUM 9.4  PROT 7.2  BILITOT 0.5  ALKPHOS 90  ALT 23  AST 28  GLUCOSE 113*   No results found for: PTT No results found for: INR, PROTIME Lab Results  Component Value Date   TROPONINI 6.93* 04/13/2014    No results found for: CHOL No results found for: HDL No results found for: LDLCALC No results found for: TRIG No results found for: CHOLHDL No results found for: LDLDIRECT    Radiology:  Dg Chest 2 View  04/13/2014   CLINICAL DATA:  Cough for 1 week.  Low-grade fever  EXAM: CHEST  2 VIEW  COMPARISON:  02/19/2014  FINDINGS: Normal mediastinum and cardiac silhouette. Some linear opacity at the left right lung base. No pleural fluid or focal consolidation. Lungs are hyperinflated. Anterior cervical fusion noted.  IMPRESSION: Increased linear  densities at lung bases suggest bronchitis.   Electronically Signed   By: Suzy Bouchard M.D.   On: 04/13/2014 19:13   Ct Angio Chest Pe W/cm &/or Wo Cm  04/13/2014   CLINICAL DATA:  Short of breath 1 week. Cough. Concern for pulmonary embolism  EXAM: CT ANGIOGRAPHY CHEST WITH CONTRAST  TECHNIQUE: Multidetector CT imaging of the chest was performed using the standard protocol during bolus administration of intravenous contrast. Multiplanar CT image reconstructions and MIPs were obtained to evaluate the vascular anatomy.  CONTRAST:  137mL OMNIPAQUE IOHEXOL 350 MG/ML SOLN  COMPARISON:  Radiograph 04/13/2014  FINDINGS: There are no filling defects within the pulmonary arteries to suggest acute pulmonary embolism. No acute findings of the aorta great vessels. No pericardial fluid. Coronary calcifications are noted. Esophagus is normal.  Review of the lung parenchyma demonstrates mild linear nodular branching in the upper lobes. There is more defined branching nodular pattern in the inferior right lower lobe (image 69) and lingula. Airways are normal.  No axillary or supraclavicular lymphadenopathy. No mediastinal hilar lymphadenopathy.  Limited upper abdomen demonstrates normal adrenal glands. Limited view of the skeleton demonstrates no aggressive osseous lesion.  Review of the MIP images confirms the above findings.  IMPRESSION: 1. No evidence acute pulmonary embolism. 2. Branching nodular pattern in the inferior lingula and lateral right lower lobe and to less extent upper lobes consistent with inflammatory or infectious process. 3. Coronary calcifications.   Electronically Signed   By: Suzy Bouchard M.D.   On: 04/13/2014 20:43    EKG:  Sinus tachycardia with nonspecific ST abnormality  ASSESSMENT:  1.  Elevated troponin  Of ? Etiology.  He has not had any chest pain but has had some SOB.  He is hypoxic with a chest CT consistent with infectious or inflammatory process.  Chest CT was negative for PE.   Elevated Troponin may be from demand ischemia from underlying URI syndrome ? Viral PNA/hypoxia.  He could also have a viral myocarditis associated with his URI.  His AST is normal which is usually elevated in acute MI.  I do not think this is an acute coronary syndrome at this time. 2.  Acute viral syndrome with abnormal chest CT showing branching nodular pattern c/w inflammatory or infectious process.  He is on methotrexate which is an immunosuppressant.  WBC is elevated at 20K.  He did not get the influenza vaccine this year. 3.  H/O psoriasis on methotrexate  PLAN:   1.  Continue to cycle cardiac enzymes 2.  I will get an echo  tonight to assess LVF and RWMA's 3.  Check stat CPK and MB 4.  Treatment of viral syndrome per Hospitalist.  Check influenza Ab 5.  Continue IV Heparin gtt for now unless echo shows pericardial effusion 6.  Check BNP  [name], MD  04/13/2014  10:49 PM

## 2014-04-13 NOTE — ED Notes (Signed)
Aline Brochure, MD notified of pt's critical troponin level.

## 2014-04-13 NOTE — ED Notes (Addendum)
Radford Pax, MD at bedside.

## 2014-04-13 NOTE — ED Notes (Signed)
Pt coughed up yellow sputum which I was able to see

## 2014-04-14 ENCOUNTER — Encounter (HOSPITAL_COMMUNITY): Payer: Self-pay | Admitting: *Deleted

## 2014-04-14 DIAGNOSIS — R6510 Systemic inflammatory response syndrome (SIRS) of non-infectious origin without acute organ dysfunction: Secondary | ICD-10-CM | POA: Insufficient documentation

## 2014-04-14 DIAGNOSIS — A419 Sepsis, unspecified organism: Principal | ICD-10-CM

## 2014-04-14 DIAGNOSIS — R059 Cough, unspecified: Secondary | ICD-10-CM | POA: Insufficient documentation

## 2014-04-14 DIAGNOSIS — I214 Non-ST elevation (NSTEMI) myocardial infarction: Secondary | ICD-10-CM

## 2014-04-14 DIAGNOSIS — Z720 Tobacco use: Secondary | ICD-10-CM

## 2014-04-14 DIAGNOSIS — R0602 Shortness of breath: Secondary | ICD-10-CM

## 2014-04-14 DIAGNOSIS — R05 Cough: Secondary | ICD-10-CM | POA: Insufficient documentation

## 2014-04-14 DIAGNOSIS — J209 Acute bronchitis, unspecified: Secondary | ICD-10-CM | POA: Diagnosis present

## 2014-04-14 DIAGNOSIS — R0902 Hypoxemia: Secondary | ICD-10-CM

## 2014-04-14 DIAGNOSIS — L409 Psoriasis, unspecified: Secondary | ICD-10-CM

## 2014-04-14 DIAGNOSIS — R778 Other specified abnormalities of plasma proteins: Secondary | ICD-10-CM | POA: Diagnosis present

## 2014-04-14 DIAGNOSIS — R7989 Other specified abnormal findings of blood chemistry: Secondary | ICD-10-CM

## 2014-04-14 LAB — CBC
HEMATOCRIT: 39.4 % (ref 39.0–52.0)
Hemoglobin: 13 g/dL (ref 13.0–17.0)
MCH: 32.2 pg (ref 26.0–34.0)
MCHC: 33 g/dL (ref 30.0–36.0)
MCV: 97.5 fL (ref 78.0–100.0)
PLATELETS: 247 10*3/uL (ref 150–400)
RBC: 4.04 MIL/uL — ABNORMAL LOW (ref 4.22–5.81)
RDW: 13.5 % (ref 11.5–15.5)
WBC: 18.2 10*3/uL — ABNORMAL HIGH (ref 4.0–10.5)

## 2014-04-14 LAB — EXPECTORATED SPUTUM ASSESSMENT W REFEX TO RESP CULTURE

## 2014-04-14 LAB — BLOOD GAS, ARTERIAL
Acid-base deficit: 0 mmol/L (ref 0.0–2.0)
Bicarbonate: 25 mEq/L — ABNORMAL HIGH (ref 20.0–24.0)
DRAWN BY: 24513
O2 Content: 4 L/min
O2 SAT: 96.7 %
Patient temperature: 98.6
TCO2: 26.4 mmol/L (ref 0–100)
pCO2 arterial: 46.9 mmHg — ABNORMAL HIGH (ref 35.0–45.0)
pH, Arterial: 7.346 — ABNORMAL LOW (ref 7.350–7.450)
pO2, Arterial: 95.8 mmHg (ref 80.0–100.0)

## 2014-04-14 LAB — COMPREHENSIVE METABOLIC PANEL
ALT: 28 U/L (ref 0–53)
AST: 39 U/L — AB (ref 0–37)
Albumin: 2.9 g/dL — ABNORMAL LOW (ref 3.5–5.2)
Alkaline Phosphatase: 88 U/L (ref 39–117)
Anion gap: 12 (ref 5–15)
BUN: 15 mg/dL (ref 6–23)
CO2: 27 mEq/L (ref 19–32)
CREATININE: 0.8 mg/dL (ref 0.50–1.35)
Calcium: 9.2 mg/dL (ref 8.4–10.5)
Chloride: 99 mEq/L (ref 96–112)
GFR calc Af Amer: >90 mL/min (ref >90)
GFR calc non Af Amer: >90 mL/min (ref >90)
Glucose, Bld: 146 mg/dL — ABNORMAL HIGH (ref 70–99)
POTASSIUM: 4.7 meq/L (ref 3.7–5.3)
Sodium: 138 mEq/L (ref 137–147)
TOTAL PROTEIN: 7.1 g/dL (ref 6.0–8.3)
Total Bilirubin: 0.4 mg/dL (ref 0.3–1.2)

## 2014-04-14 LAB — HEPARIN LEVEL (UNFRACTIONATED)
HEPARIN UNFRACTIONATED: 0.26 [IU]/mL — AB (ref 0.30–0.70)
HEPARIN UNFRACTIONATED: 0.34 [IU]/mL (ref 0.30–0.70)
Heparin Unfractionated: <0.1 IU/mL — ABNORMAL LOW (ref 0.30–0.70)

## 2014-04-14 LAB — TROPONIN I
TROPONIN I: 6.38 ng/mL — AB (ref <0.30)
Troponin I: 6.16 ng/mL (ref <0.30)
Troponin I: 4.21 ng/mL (ref <0.30)

## 2014-04-14 LAB — INFLUENZA PANEL BY PCR (TYPE A & B)
H1N1FLUPCR: NOT DETECTED
INFLBPCR: NEGATIVE
Influenza A By PCR: NEGATIVE

## 2014-04-14 LAB — EXPECTORATED SPUTUM ASSESSMENT W GRAM STAIN, RFLX TO RESP C

## 2014-04-14 LAB — HIV ANTIBODY (ROUTINE TESTING W REFLEX): HIV 1&2 Ab, 4th Generation: NONREACTIVE

## 2014-04-14 LAB — PRO B NATRIURETIC PEPTIDE: Pro B Natriuretic peptide (BNP): 1376 pg/mL — ABNORMAL HIGH (ref 0–125)

## 2014-04-14 LAB — MRSA PCR SCREENING: MRSA BY PCR: NEGATIVE

## 2014-04-14 LAB — STREP PNEUMONIAE URINARY ANTIGEN: Strep Pneumo Urinary Antigen: NEGATIVE

## 2014-04-14 MED ORDER — LEVOFLOXACIN IN D5W 750 MG/150ML IV SOLN
750.0000 mg | INTRAVENOUS | Status: DC
Start: 2014-04-14 — End: 2014-04-15
  Administered 2014-04-14 – 2014-04-15 (×2): 750 mg via INTRAVENOUS
  Filled 2014-04-14 (×2): qty 150

## 2014-04-14 MED ORDER — IPRATROPIUM-ALBUTEROL 0.5-2.5 (3) MG/3ML IN SOLN
3.0000 mL | Freq: Three times a day (TID) | RESPIRATORY_TRACT | Status: DC
  Administered 2014-04-14 – 2014-04-15 (×3): 3 mL via RESPIRATORY_TRACT
  Filled 2014-04-14 (×4): qty 3

## 2014-04-14 MED ORDER — METHYLPREDNISOLONE SODIUM SUCC 125 MG IJ SOLR
60.0000 mg | Freq: Four times a day (QID) | INTRAMUSCULAR | Status: DC
  Administered 2014-04-14 – 2014-04-15 (×6): 60 mg via INTRAVENOUS
  Filled 2014-04-14 (×4): qty 0.96
  Filled 2014-04-14: qty 2
  Filled 2014-04-14 (×3): qty 0.96
  Filled 2014-04-14: qty 2
  Filled 2014-04-14: qty 0.96

## 2014-04-14 MED ORDER — FOLIC ACID 1 MG PO TABS
1.0000 mg | ORAL_TABLET | Freq: Every day | ORAL | Status: DC
  Administered 2014-04-14 – 2014-04-15 (×2): 1 mg via ORAL
  Filled 2014-04-14 (×2): qty 1

## 2014-04-14 MED ORDER — IPRATROPIUM-ALBUTEROL 0.5-2.5 (3) MG/3ML IN SOLN
3.0000 mL | RESPIRATORY_TRACT | Status: DC
  Administered 2014-04-14 (×2): 3 mL via RESPIRATORY_TRACT
  Filled 2014-04-14 (×2): qty 3

## 2014-04-14 MED ORDER — ASPIRIN 81 MG PO CHEW
81.0000 mg | CHEWABLE_TABLET | Freq: Every day | ORAL | Status: DC
Start: 2014-04-14 — End: 2014-04-14
  Administered 2014-04-14: 81 mg via ORAL
  Filled 2014-04-14: qty 1

## 2014-04-14 MED ORDER — HEPARIN BOLUS VIA INFUSION
2000.0000 [IU] | Freq: Once | INTRAVENOUS | Status: AC
  Administered 2014-04-14: 2000 [IU] via INTRAVENOUS
  Filled 2014-04-14: qty 2000

## 2014-04-14 MED ORDER — CETYLPYRIDINIUM CHLORIDE 0.05 % MT LIQD
7.0000 mL | Freq: Two times a day (BID) | OROMUCOSAL | Status: DC
  Administered 2014-04-14 (×3): 7 mL via OROMUCOSAL

## 2014-04-14 MED ORDER — VANCOMYCIN HCL IN DEXTROSE 1-5 GM/200ML-% IV SOLN
1000.0000 mg | Freq: Three times a day (TID) | INTRAVENOUS | Status: DC
  Administered 2014-04-14 – 2014-04-15 (×5): 1000 mg via INTRAVENOUS
  Filled 2014-04-14 (×6): qty 200

## 2014-04-14 MED ORDER — GUAIFENESIN ER 600 MG PO TB12
600.0000 mg | ORAL_TABLET | Freq: Two times a day (BID) | ORAL | Status: DC
  Administered 2014-04-14 – 2014-04-15 (×4): 600 mg via ORAL
  Filled 2014-04-14 (×5): qty 1

## 2014-04-14 MED ORDER — ASPIRIN 81 MG PO CHEW
81.0000 mg | CHEWABLE_TABLET | Freq: Every day | ORAL | Status: DC
  Administered 2014-04-15: 81 mg via ORAL
  Filled 2014-04-14: qty 1

## 2014-04-14 MED ORDER — HEPARIN (PORCINE) IN NACL 100-0.45 UNIT/ML-% IJ SOLN
1900.0000 [IU]/h | INTRAMUSCULAR | Status: DC
  Administered 2014-04-15: 1600 [IU]/h via INTRAVENOUS
  Filled 2014-04-14 (×2): qty 250

## 2014-04-14 NOTE — Progress Notes (Addendum)
ANTICOAGULATION CONSULT NOTE - Initial Consult  Pharmacy Consult for heparin Indication: chest pain/ACS  Allergies  Allergen Reactions  . Flonase [Fluticasone Propionate]     Nose bleeds.  . Penicillins     Patient Measurements: Height: 5\' 6"  (167.6 cm) Weight: 195 lb 15.8 oz (88.9 kg) IBW/kg (Calculated) : 63.8 Heparin Dosing Weight: 85kg  Vital Signs: Temp: 100.2 F (37.9 C) (12/20 1137) Temp Source: Oral (12/20 1137) BP: 133/66 mmHg (12/20 1300) Pulse Rate: 82 (12/20 1300)  Labs:  Recent Labs  04/13/14 1823 04/13/14 2115  04/14/14 0104 04/14/14 0252 04/14/14 0735 04/14/14 1240  HGB 13.1  --   --   --  13.0  --   --   HCT 39.6  --   --   --  39.4  --   --   PLT 254  --   --   --  247  --   --   HEPARINUNFRC  --   --   --   --  <0.10*  --  0.26*  CREATININE 0.87  --   --   --   --  0.80  --   CKTOTAL  --  428*  --   --   --   --   --   CKMB  --  29.0*  --   --   --   --   --   TROPONINI 3.79*  --   < > 6.16*  --  6.38* 4.21*  < > = values in this interval not displayed.  Estimated Creatinine Clearance: 93.5 mL/min (by C-G formula based on Cr of 0.8).   Medical History: Past Medical History  Diagnosis Date  . Psoriasis     Assessment: 68 year old male presents to [facility] with shortness of breath and cough. Patient now with peak trop of 6.38 started on IV heparin. HL remains SUBtherapeutic but has trended up at 0.26 after rate increase this am. No interruptions in gtt per nurse. CBC stable and wnl with no reported s/s bleeding.   Goal of Therapy:  Heparin level 0.3-0.7 units/ml Monitor platelets by anticoagulation protocol: Yes   Plan:  - Increase heparin gtt to 1600 u/hr (~2 u/kg/hr increase) - HL in 6 hours - Daily HL/CBC - Monitor for any s/s bleeding - F/u plans for cath  Mor[place] K. Velva Harman, PharmD Clinical Pharmacist - Resident Pager: [phone] Pharmacy: [phone] 04/14/2014 1:35 PM   Addendum: Follow-up heparin level (0.34) is now  therapeutic after rate increase - will continue current heparin rate and follow-up AM labs.  Janina Mayo, PharmD Clinical Pharmacist [phone] 04/14/2014, 9:09 PM

## 2014-04-14 NOTE — Progress Notes (Signed)
2D echo showed low normal LVF visually at 50-55%.  Tissue doppler with strain is normal and calculated EF of 56% and average LV global strain of -18%.  There are no obvious wall motion abnormalities.  At this time I do not think there is any indication to go to the cath lab emergently.  He has no chest pain and EKG shows no acute ST elevation.  There are no RWMA's on echo.  He does have coronary artery calcifications on chest CT and most likely has CAD but no indication at present to go emergently to cath.  This was discussed with Dr. [place].  Will hold on beta blockers at present due to active wheezing.

## 2014-04-14 NOTE — Progress Notes (Signed)
Echocardiogram 2D Echocardiogram has been performed.  Joseph Shields 04/14/2014, 12:25 AM

## 2014-04-14 NOTE — Progress Notes (Signed)
DAILY PROGRESS NOTE  Subjective:  No chest pain - ever. On IV heparin. Positive troponin consistent with NSTEMI - EF 50-55% on echo. He is refusing catheterization. Wants to go home today.  Objective:  Temp:  [97.1 F (36.2 C)-98.4 F (36.9 C)] 97.1 F (36.2 C) (12/20 0809) Pulse Rate:  [68-103] 73 (12/20 0900) Resp:  [15-26] 19 (12/20 0900) BP: (112-151)/(54-123) 132/60 mmHg (12/20 0900) SpO2:  [84 %-97 %] 96 % (12/20 0900) Weight:  [195 lb 15.8 oz (88.9 kg)-197 lb 1.5 oz (89.4 kg)] 195 lb 15.8 oz (88.9 kg) (12/20 0032) Weight change:   Intake/Output from previous day: 12/19 0701 - 12/20 0700 In: 325.9 [P.O.:60; I.V.:65.9; IV Piggyback:200] Out: -   Intake/Output from this shift: Total I/O In: 42 [I.V.:42] Out: -   Medications: Current Facility-Administered Medications  Medication Dose Route Frequency Provider Last Rate Last Dose  . antiseptic oral rinse (CPC / CETYLPYRIDINIUM CHLORIDE 0.05%) solution 7 mL  7 mL Mouth Rinse BID Berle Mull, MD   7 mL at 04/14/14 1022  . [START ON 04/15/2014] aspirin chewable tablet 81 mg  81 mg Oral Daily Allie Bossier, MD      . folic acid (FOLVITE) tablet 1 mg  1 mg Oral Daily Berle Mull, MD   1 mg at 04/14/14 1021  . guaiFENesin (MUCINEX) 12 hr tablet 600 mg  600 mg Oral BID Berle Mull, MD   600 mg at 04/14/14 1021  . heparin ADULT infusion 100 units/mL (25000 units/250 mL)  1,400 Units/hr Intravenous Continuous Rogue Bussing, RPH 14 mL/hr at 04/14/14 1025 1,400 Units/hr at 04/14/14 1025  . ipratropium-albuterol (DUONEB) 0.5-2.5 (3) MG/3ML nebulizer solution 3 mL  3 mL Nebulization TID Allie Bossier, MD      . levofloxacin (LEVAQUIN) IVPB 750 mg  750 mg Intravenous Q24H Berle Mull, MD   750 mg at 04/14/14 0136  . methylPREDNISolone sodium succinate (SOLU-MEDROL) 125 mg/2 mL injection 60 mg  60 mg Intravenous Q6H Berle Mull, MD   60 mg at 04/14/14 0800  . vancomycin (VANCOCIN) IVPB 1000 mg/200 mL premix  1,000 mg  Intravenous Q8H Rogue Bussing, RPH   1,000 mg at 04/14/14 1021    Physical Exam: General appearance: alert and no distress Lungs: diminished breath sounds bilaterally and rhonchi bilaterally Heart: regular rate and rhythm Extremities: extremities normal, atraumatic, no cyanosis or edema  Lab Results: Results for orders placed or performed during the hospital encounter of 04/13/14 (from the past 48 hour(s))  CBC with Differential     Status: Abnormal   Collection Time: 04/13/14  6:23 PM  Result Value Ref Range   WBC 20.3 (H) 4.0 - 10.5 K/uL   RBC 4.18 (L) 4.22 - 5.81 MIL/uL   Hemoglobin 13.1 13.0 - 17.0 g/dL   HCT 39.6 39.0 - 52.0 %   MCV 94.7 78.0 - 100.0 fL   MCH 31.3 26.0 - 34.0 pg   MCHC 33.1 30.0 - 36.0 g/dL   RDW 13.4 11.5 - 15.5 %   Platelets 254 150 - 400 K/uL   Neutrophils Relative % 73 43 - 77 %   Neutro Abs 14.9 (H) 1.7 - 7.7 K/uL   Lymphocytes Relative 6 (L) 12 - 46 %   Lymphs Abs 1.2 0.7 - 4.0 K/uL   Monocytes Relative 20 (H) 3 - 12 %   Monocytes Absolute 4.1 (H) 0.1 - 1.0 K/uL   Eosinophils Relative 1 0 - 5 %   Eosinophils Absolute  0.1 0.0 - 0.7 K/uL   Basophils Relative 0 0 - 1 %   Basophils Absolute 0.0 0.0 - 0.1 K/uL  Comprehensive metabolic panel     Status: Abnormal   Collection Time: 04/13/14  6:23 PM  Result Value Ref Range   Sodium 138 137 - 147 mEq/L   Potassium 4.2 3.7 - 5.3 mEq/L   Chloride 99 96 - 112 mEq/L   CO2 25 19 - 32 mEq/L   Glucose, Bld 113 (H) 70 - 99 mg/dL   BUN 17 6 - 23 mg/dL   Creatinine, Ser 0.87 0.50 - 1.35 mg/dL   Calcium 9.4 8.4 - 10.5 mg/dL   Total Protein 7.2 6.0 - 8.3 g/dL   Albumin 3.3 (L) 3.5 - 5.2 g/dL   AST 28 0 - 37 U/L   ALT 23 0 - 53 U/L   Alkaline Phosphatase 90 39 - 117 U/L   Total Bilirubin 0.5 0.3 - 1.2 mg/dL   GFR calc non Af Amer 87 (L) >90 mL/min   GFR calc Af Amer >90 >90 mL/min    Comment: (NOTE) The eGFR has been calculated using the CKD EPI equation. This calculation has not been validated in  all clinical situations. eGFR's persistently <90 mL/min signify possible Chronic Kidney Disease.    Anion gap 14 5 - 15  Troponin I     Status: Abnormal   Collection Time: 04/13/14  6:23 PM  Result Value Ref Range   Troponin I 3.79 (HH) <0.30 ng/mL    Comment:        Due to the release kinetics of cTnI, a negative result within the first hours of the onset of symptoms does not rule out myocardial infarction with certainty. If myocardial infarction is still suspected, repeat the test at appropriate intervals. CRITICAL RESULT CALLED TO, READ BACK BY AND VERIFIED WITH: A.RULIS,RN 1930 04/13/14 M.CAMPBELL    CK total and CKMB (cardiac)     Status: Abnormal   Collection Time: 04/13/14  9:15 PM  Result Value Ref Range   Total CK 428 (H) 7 - 232 U/L   CK, MB 29.0 (HH) 0.3 - 4.0 ng/mL    Comment: CRITICAL RESULT CALLED TO, READ BACK BY AND VERIFIED WITH: WATTS,J RN 04/13/2014 2312 JORSDANS    Relative Index 6.8 (H) 0.0 - 2.5  Troponin I     Status: Abnormal   Collection Time: 04/13/14  9:16 PM  Result Value Ref Range   Troponin I 6.93 (HH) <0.30 ng/mL    Comment:        Due to the release kinetics of cTnI, a negative result within the first hours of the onset of symptoms does not rule out myocardial infarction with certainty. If myocardial infarction is still suspected, repeat the test at appropriate intervals. CRITICAL VALUE NOTED.  VALUE IS CONSISTENT WITH PREVIOUSLY REPORTED AND CALLED VALUE.   Pro b natriuretic peptide (BNP)     Status: Abnormal   Collection Time: 04/13/14  9:16 PM  Result Value Ref Range   Pro B Natriuretic peptide (BNP) 1376.0 (H) 0 - 125 pg/mL  MRSA PCR Screening     Status: None   Collection Time: 04/14/14 12:28 AM  Result Value Ref Range   MRSA by PCR NEGATIVE NEGATIVE    Comment:        The GeneXpert MRSA Assay (FDA approved for NASAL specimens only), is one component of a comprehensive MRSA colonization surveillance program. It is  not intended to diagnose MRSA infection  nor to guide or monitor treatment for MRSA infections.   Troponin I (q 6hr x 3)     Status: Abnormal   Collection Time: 04/14/14  1:04 AM  Result Value Ref Range   Troponin I 6.16 (HH) <0.30 ng/mL    Comment:        Due to the release kinetics of cTnI, a negative result within the first hours of the onset of symptoms does not rule out myocardial infarction with certainty. If myocardial infarction is still suspected, repeat the test at appropriate intervals. CRITICAL VALUE NOTED.  VALUE IS CONSISTENT WITH PREVIOUSLY REPORTED AND CALLED VALUE.   Strep pneumoniae urinary antigen     Status: None   Collection Time: 04/14/14  1:26 AM  Result Value Ref Range   Strep Pneumo Urinary Antigen NEGATIVE NEGATIVE    Comment:        Infection due to S. pneumoniae cannot be absolutely ruled out since the antigen present may be below the detection limit of the test.   Blood gas, arterial     Status: Abnormal   Collection Time: 04/14/14  1:48 AM  Result Value Ref Range   O2 Content 4.0 L/min   Delivery systems NASAL CANNULA    Mode NASAL CANNULA    pH, Arterial 7.346 (L) 7.350 - 7.450   pCO2 arterial 46.9 (H) 35.0 - 45.0 mmHg   pO2, Arterial 95.8 80.0 - 100.0 mmHg   Bicarbonate 25.0 (H) 20.0 - 24.0 mEq/L   TCO2 26.4 0 - 100 mmol/L   Acid-base deficit 0.0 0.0 - 2.0 mmol/L   O2 Saturation 96.7 %   Patient temperature 98.6    Collection site RIGHT RADIAL    Drawn by [phone]    Sample type ARTERIAL    Allens test (pass/fail) PASS PASS  Culture, sputum-assessment     Status: None   Collection Time: 04/14/14  2:08 AM  Result Value Ref Range   Specimen Description SPUTUM    Special Requests NONE    Sputum evaluation      MICROSCOPIC FINDINGS SUGGEST THAT THIS SPECIMEN IS NOT REPRESENTATIVE OF LOWER RESPIRATORY SECRETIONS. PLEASE RECOLLECT. CALLED TO CORO,J RN AT 0355 04/14/14 BY MITCHELL,L    Report Status 04/14/2014 FINAL   Heparin level  (unfractionated)     Status: Abnormal   Collection Time: 04/14/14  2:52 AM  Result Value Ref Range   Heparin Unfractionated <0.10 (L) 0.30 - 0.70 IU/mL    Comment:        IF HEPARIN RESULTS ARE BELOW EXPECTED VALUES, AND PATIENT DOSAGE HAS BEEN CONFIRMED, SUGGEST FOLLOW UP TESTING OF ANTITHROMBIN III LEVELS.   CBC     Status: Abnormal   Collection Time: 04/14/14  2:52 AM  Result Value Ref Range   WBC 18.2 (H) 4.0 - 10.5 K/uL   RBC 4.04 (L) 4.22 - 5.81 MIL/uL   Hemoglobin 13.0 13.0 - 17.0 g/dL   HCT 39.4 39.0 - 52.0 %   MCV 97.5 78.0 - 100.0 fL   MCH 32.2 26.0 - 34.0 pg   MCHC 33.0 30.0 - 36.0 g/dL   RDW 13.5 11.5 - 15.5 %   Platelets 247 150 - 400 K/uL  Culture, expectorated sputum-assessment     Status: None   Collection Time: 04/14/14  4:50 AM  Result Value Ref Range   Specimen Description SPUTUM    Special Requests NONE    Sputum evaluation      THIS SPECIMEN IS ACCEPTABLE. RESPIRATORY CULTURE REPORT TO FOLLOW.  Report Status 04/14/2014 FINAL   Troponin I (q 6hr x 3)     Status: Abnormal   Collection Time: 04/14/14  7:35 AM  Result Value Ref Range   Troponin I 6.38 (HH) <0.30 ng/mL    Comment:        Due to the release kinetics of cTnI, a negative result within the first hours of the onset of symptoms does not rule out myocardial infarction with certainty. If myocardial infarction is still suspected, repeat the test at appropriate intervals. CRITICAL VALUE NOTED.  VALUE IS CONSISTENT WITH PREVIOUSLY REPORTED AND CALLED VALUE.   Comprehensive metabolic panel     Status: Abnormal   Collection Time: 04/14/14  7:35 AM  Result Value Ref Range   Sodium 138 137 - 147 mEq/L   Potassium 4.7 3.7 - 5.3 mEq/L   Chloride 99 96 - 112 mEq/L   CO2 27 19 - 32 mEq/L   Glucose, Bld 146 (H) 70 - 99 mg/dL   BUN 15 6 - 23 mg/dL   Creatinine, Ser 0.80 0.50 - 1.35 mg/dL   Calcium 9.2 8.4 - 10.5 mg/dL   Total Protein 7.1 6.0 - 8.3 g/dL   Albumin 2.9 (L) 3.5 - 5.2 g/dL   AST 39 (H)  0 - 37 U/L   ALT 28 0 - 53 U/L   Alkaline Phosphatase 88 39 - 117 U/L   Total Bilirubin 0.4 0.3 - 1.2 mg/dL   GFR calc non Af Amer >90 >90 mL/min   GFR calc Af Amer >90 >90 mL/min    Comment: (NOTE) The eGFR has been calculated using the CKD EPI equation. This calculation has not been validated in all clinical situations. eGFR's persistently <90 mL/min signify possible Chronic Kidney Disease.    Anion gap 12 5 - 15    Imaging: Dg Chest 2 View  04/13/2014   CLINICAL DATA:  Cough for 1 week.  Low-grade fever  EXAM: CHEST  2 VIEW  COMPARISON:  02/19/2014  FINDINGS: Normal mediastinum and cardiac silhouette. Some linear opacity at the left right lung base. No pleural fluid or focal consolidation. Lungs are hyperinflated. Anterior cervical fusion noted.  IMPRESSION: Increased linear densities at lung bases suggest bronchitis.   Electronically Signed   By: Suzy Bouchard M.D.   On: 04/13/2014 19:13   Ct Angio Chest Pe W/cm &/or Wo Cm  04/13/2014   CLINICAL DATA:  Short of breath 1 week. Cough. Concern for pulmonary embolism  EXAM: CT ANGIOGRAPHY CHEST WITH CONTRAST  TECHNIQUE: Multidetector CT imaging of the chest was performed using the standard protocol during bolus administration of intravenous contrast. Multiplanar CT image reconstructions and MIPs were obtained to evaluate the vascular anatomy.  CONTRAST:  184m OMNIPAQUE IOHEXOL 350 MG/ML SOLN  COMPARISON:  Radiograph 04/13/2014  FINDINGS: There are no filling defects within the pulmonary arteries to suggest acute pulmonary embolism. No acute findings of the aorta great vessels. No pericardial fluid. Coronary calcifications are noted. Esophagus is normal.  Review of the lung parenchyma demonstrates mild linear nodular branching in the upper lobes. There is more defined branching nodular pattern in the inferior right lower lobe (image 69) and lingula. Airways are normal.  No axillary or supraclavicular lymphadenopathy. No mediastinal hilar  lymphadenopathy.  Limited upper abdomen demonstrates normal adrenal glands. Limited view of the skeleton demonstrates no aggressive osseous lesion.  Review of the MIP images confirms the above findings.  IMPRESSION: 1. No evidence acute pulmonary embolism. 2. Branching nodular pattern in the inferior  lingula and lateral right lower lobe and to less extent upper lobes consistent with inflammatory or infectious process. 3. Coronary calcifications.   Electronically Signed   By: Suzy Bouchard M.D.   On: 04/13/2014 20:43    Assessment:  1. Principal Problem: 2.   Sepsis 3. Active Problems: 4.   Psoriasis 5.   Hypoxia 6.   Elevated troponin 7.   Acute bronchitis 8.   Smoker 9.   Plan:  1. NSTEMi in the setting of pneumonia. Suspect CAD - now on IV heparin. Discussed LHC with him and he is not interested. Says he "has to get home today to take care of his wife".  I explained the risk of recurrent MI and sudden death and he understands that. He is still adamant about leaving. I do not feel that he is medically well to be discharged - it would be against my medical advice to do that.  For now continue current therapies. Keep NPO for possible cath if he changes his mind tomorrow.  Time Spent Directly with Patient:  15 minutes  Length of Stay:  LOS: 1 day   Pixie Casino, MD, [facility] Attending Cardiologist [facility]  HILTY,Kenneth C 04/14/2014, 11:15 AM

## 2014-04-14 NOTE — Progress Notes (Signed)
ANTICOAGULATION CONSULT NOTE - Follow Up Consult  Pharmacy Consult for heparin Indication: chest pain/ACS  Labs:  Recent Labs  04/13/14 1823 04/13/14 2115 04/13/14 2116 04/14/14 0104 04/14/14 0252  HGB 13.1  --   --   --  13.0  HCT 39.6  --   --   --  39.4  PLT 254  --   --   --  247  HEPARINUNFRC  --   --   --   --  <0.10*  CREATININE 0.87  --   --   --   --   CKTOTAL  --  428*  --   --   --   CKMB  --  29.0*  --   --   --   TROPONINI 3.79*  --  6.93* 6.16*  --     Assessment: 68yo male undetectable on heparin with initial dosing for elevated troponin of unknown etiology, most likely CAD.  Goal of Therapy:  Heparin level 0.3-0.7 units/ml   Plan:  Will rebolus with heparin 2000 units and increase gtt by ~4 units/kg/hr to 1400 units/hr and check level in 6hr.  Wynona Neat, PharmD, BCPS  04/14/2014,4:34 AM

## 2014-04-14 NOTE — Progress Notes (Signed)
[place] TEAM 1 - Stepdown/ICU TEAM Progress Note  Joseph Shields WPV:[id number] DOB: 10-04-1945 DOA: 04/13/2014 PCP: Chevis Pretty, FNP  Admit HPI / Brief Narrative: Joseph Shields is a 68 y.o. WM PMHx  psoriasis on methotrexate, tobacco abuse,. The patient presents with complaints of cough and fever. He mentions his symptoms started 5 days ago initially he had some cough and congestion. He saw his PCP a few days later and he was started on azithromycin as well as opioid for cough. After taking azithromycin for 3 days his symptoms were not getting better therefore he decided to come to the hospital. He denies any complaint of chest pain chest heaviness chest tightness. He denies any nausea or vomiting denies any episode of choking denies any diarrhea or constipation, denies any burning urination or leg swelling. Denies any recent travel or recent surgery of recent procedure. Denies any recent changes in his medication. He has been on methotrexate but he has not taken his dose last Wednesday due to ongoing infection. He is an active smoker and quit smoking several days ago. Denies any alcohol abuse or drug abuse. Denies any sick contacts.  The patient is coming from home. And at his baseline independent for most of his ADL.   HPI/Subjective: 12/20 states HE WILL NOT HAVE CARDIAC CATHETERIZATION, negative CP, positive SOB, negative N/V   Assessment/Plan: SIRS/bronchitis/COPD exacerbation  -Patient barely meets criteria for SIRS -Continue antibiotics -Continue Solu-Medrol 60 mg daily -Continue DuoNeb TID -Continue Mucinex -Start flutter valve Q4hr -Titrate O2 to maintain SPO2 89% -93%  -if patient refuses cardiac catheterization again in the a.m. transition to by mouth medication and discharge   Acute hypoxia. -ABG; slightly acidotic. -BiPAP as needed. -See SIRS   NSTEMI -Cardiology plans on performing cardiac catheterization on 12/21 -PATIENT STATES REFUSES  CARDIAC CATHETERIZATION -Continue heparin drip -Lipid panel -A1c  Elevated troponin. -See NSTEMI  Psoriasis. -Hold methotrexate.  Nicotine abuse -Counseled on sequela of continuing to smoke to include death   Code Status: FULL Family Communication: Daughter present at time of exam Disposition Plan: Patient refusing cardiac catheterization as recommended by cardiology discharge in a.m.    Consultants: Dr. Lyman Bishop (cardiology)    Procedure/Significant Events: 12/19 CT angiogram PE protocol;-No evidence acute pulmonary embolism. -. Branching nodular pattern in the inferior lingula and lateral RLL>upper lobes consistent with inflammatory or infectious process.    Culture 12/20 MRSA by PCR negative 12/20 blood 2 pending 12/20 sputum pending    Antibiotics: Azithromycin 12/19>> Levofloxacin 12/20>> Vancomycin 12/20>>   DVT prophylaxis: Heparin infusion   Devices    LINES / TUBES:      Continuous Infusions: . heparin 1,600 Units/hr (04/14/14 1948)    Objective: VITAL SIGNS: Temp: 98.5 F (36.9 C) (12/20 1948) Temp Source: Oral (12/20 1948) BP: 128/64 mmHg (12/20 1900) Pulse Rate: 90 (12/20 1948) SPO2; FIO2:   Intake/Output Summary (Last 24 hours) at 04/14/14 2042 Last data filed at 04/14/14 2000  Gross per 24 hour  Intake    974 ml  Output    550 ml  Net    424 ml     Exam: General: A/O 4, NAD, No acute respiratory distress Lungs: Poor movement in all lung fields, negative wheezes or crackles Cardiovascular: Regular rate and rhythm without murmur gallop or rub normal S1 and S2 Abdomen: Nontender, nondistended, soft, bowel sounds positive, no rebound, no ascites, no appreciable mass Extremities: No significant cyanosis, clubbing, or edema bilateral lower extremities  Data Reviewed: Basic Metabolic  Panel:  Recent Labs Lab 04/13/14 1823 04/14/14 0735  NA 138 138  K 4.2 4.7  CL 99 99  CO2 25 27  GLUCOSE 113* 146*  BUN 17  15  CREATININE 0.87 0.80  CALCIUM 9.4 9.2   Liver Function Tests:  Recent Labs Lab 04/13/14 1823 04/14/14 0735  AST 28 39*  ALT 23 28  ALKPHOS 90 88  BILITOT 0.5 0.4  PROT 7.2 7.1  ALBUMIN 3.3* 2.9*   No results for input(s): LIPASE, AMYLASE in the last 168 hours. No results for input(s): AMMONIA in the last 168 hours. CBC:  Recent Labs Lab 04/13/14 1823 04/14/14 0252  WBC 20.3* 18.2*  NEUTROABS 14.9*  --   HGB 13.1 13.0  HCT 39.6 39.4  MCV 94.7 97.5  PLT 254 247   Cardiac Enzymes:  Recent Labs Lab 04/13/14 1823 04/13/14 2115 04/13/14 2116 04/14/14 0104 04/14/14 0735 04/14/14 1240  CKTOTAL  --  428*  --   --   --   --   CKMB  --  29.0*  --   --   --   --   TROPONINI 3.79*  --  6.93* 6.16* 6.38* 4.21*   BNP (last 3 results)  Recent Labs  04/13/14 2116  PROBNP 1376.0*   CBG: No results for input(s): GLUCAP in the last 168 hours.  Recent Results (from the past 240 hour(s))  MRSA PCR Screening     Status: None   Collection Time: 04/14/14 12:28 AM  Result Value Ref Range Status   MRSA by PCR NEGATIVE NEGATIVE Final    Comment:        The GeneXpert MRSA Assay (FDA approved for NASAL specimens only), is one component of a comprehensive MRSA colonization surveillance program. It is not intended to diagnose MRSA infection nor to guide or monitor treatment for MRSA infections.   Culture, sputum-assessment     Status: None   Collection Time: 04/14/14  2:08 AM  Result Value Ref Range Status   Specimen Description SPUTUM  Final   Special Requests NONE  Final   Sputum evaluation   Final    MICROSCOPIC FINDINGS SUGGEST THAT THIS SPECIMEN IS NOT REPRESENTATIVE OF LOWER RESPIRATORY SECRETIONS. PLEASE RECOLLECT. CALLED TO CORO,J RN AT [phone] 04/14/14 BY MITCHELL,L    Report Status 04/14/2014 FINAL  Final  Culture, expectorated sputum-assessment     Status: None   Collection Time: 04/14/14  4:50 AM  Result Value Ref Range Status   Specimen Description  SPUTUM  Final   Special Requests NONE  Final   Sputum evaluation   Final    THIS SPECIMEN IS ACCEPTABLE. RESPIRATORY CULTURE REPORT TO FOLLOW.   Report Status 04/14/2014 FINAL  Final     Studies:  Recent x-ray studies have been reviewed in detail by the Attending Physician  Scheduled Meds:  Scheduled Meds: . antiseptic oral rinse  7 mL Mouth Rinse BID  . [START ON 04/15/2014] aspirin  81 mg Oral Daily  . folic acid  1 mg Oral Daily  . guaiFENesin  600 mg Oral BID  . ipratropium-albuterol  3 mL Nebulization TID  . levofloxacin (LEVAQUIN) IV  750 mg Intravenous Q24H  . methylPREDNISolone (SOLU-MEDROL) injection  60 mg Intravenous Q6H  . vancomycin  1,000 mg Intravenous Q8H    Time spent on care of this patient: 40 mins   Allie Bossier , MD   [facility] Office  [phone] Pager [phone]  On-Call/Text Page:  [email or web address]      password [email or web address]  If 7PM-7AM, please contact night-coverage [email or web address] Password [email or web address] 04/14/2014, 8:42 PM   LOS: 1 day

## 2014-04-14 NOTE — H&P (Signed)
[facility] History and Physical  Patient: Joseph Shields  LKG:[id number]  DOB: 1945-10-18  DOS: the patient was seen and examined on 04/14/2014 PCP: Chevis Pretty, FNP  Chief Complaint: Cough  HPI: Joseph Shields is a 68 y.o. male with Past medical history of psoriasis on methotrexate. The patient presents with complaints of cough and fever. He mentions his symptoms started 5 days ago initially he had some cough and congestion. He saw his PCP a few days later and he was started on azithromycin as well as opioid for cough. After taking azithromycin for 3 days his symptoms were not getting better therefore he decided to come to the hospital. He denies any complaint of chest pain chest heaviness chest tightness. He denies any nausea or vomiting denies any episode of choking denies any diarrhea or constipation, denies any burning urination or leg swelling. Denies any recent travel or recent surgery of recent procedure. Denies any recent changes in his medication. He has been on methotrexate but he has not taken his dose last Wednesday due to ongoing infection. He is an active smoker and quit smoking several days ago. Denies any alcohol abuse or drug abuse. Denies any sick contacts.  The patient is coming from home. And at his baseline independent for most of his ADL.  Review of Systems: as mentioned in the history of present illness.  A Comprehensive review of the other systems is negative.  Past Medical History  Diagnosis Date  . Psoriasis    Past Surgical History  Procedure Laterality Date  . Spine surgery     Social History:  reports that he quit smoking 7 days ago. His smoking use included Cigarettes. He smoked 0.00 packs per day. He does not have any smokeless tobacco history on file. He reports that he does not drink alcohol or use illicit drugs.  Allergies  Allergen Reactions  . Flonase [Fluticasone Propionate]     Nose bleeds.  . Penicillins     History  reviewed. No pertinent family history.  Prior to Admission medications   Medication Sig Start Date End Date Taking? Authorizing Provider  acetaminophen (TYLENOL) 650 MG CR tablet Take 650 mg by mouth every 8 (eight) hours as needed for pain.   Yes Historical Provider, MD  aspirin 81 MG tablet Take 81 mg by mouth daily.   Yes Historical Provider, MD  azithromycin (ZITHROMAX Z-PAK) 250 MG tablet As directed Patient taking differently: Take 250-500 mg by mouth See admin instructions. Take two tablets on day 1, and then one tablet on days 2-5 04/11/14  Yes Mary-Margaret Hassell Done, FNP  folic acid (FOLVITE) 1 MG tablet Take 1 mg by mouth daily.   Yes Historical Provider, MD  methotrexate (RHEUMATREX) 2.5 MG tablet Take 7.5 mg by mouth once a week.   Yes Historical Provider, MD  azithromycin (ZITHROMAX) 250 MG tablet 2 pills the first day then 1 daily until completed Patient not taking: Reported on 04/13/2014 02/19/14   Chipper Herb, MD  cetirizine (ZYRTEC) 10 MG tablet Take 10 mg by mouth daily.    Historical Provider, MD  HYDROcodone-homatropine (HYCODAN) 5-1.5 MG/5ML syrup Take 5 mLs by mouth every 8 (eight) hours as needed for cough. Patient not taking: Reported on 04/13/2014 04/11/14   Mary-Margaret Hassell Done, FNP    Physical Exam: Filed Vitals:   04/14/14 0200 04/14/14 0300 04/14/14 0400 04/14/14 0500  BP: 129/60 116/65 114/71 120/73  Pulse: 84 92 71 77  Temp:      TempSrc:  Resp: 23 24 22 24   Height:      Weight:      SpO2: 93% 90% 94% 90%    General: Alert, Awake and Oriented to Time, Place and Person. Appear in mild distress Eyes: PERRL ENT: Oral Mucosa clear moist. Neck: no JVD Cardiovascular: S1 and S2 Present, no Murmur, Peripheral Pulses Present Respiratory: Bilateral Air entry equal and Decreased, no Crackles, bilateral wheezes Abdomen: Bowel Sound present, Soft and no tender Skin: no Rash Extremities: no Pedal edema, no calf tenderness Neurologic: Grossly no focal  neuro deficit.  Labs on Admission:  CBC:  Recent Labs Lab 04/13/14 1823 04/14/14 0252  WBC 20.3* 18.2*  NEUTROABS 14.9*  --   HGB 13.1 13.0  HCT 39.6 39.4  MCV 94.7 97.5  PLT 254 247    CMP     Component Value Date/Time   NA 138 04/13/2014 1823   K 4.2 04/13/2014 1823   CL 99 04/13/2014 1823   CO2 25 04/13/2014 1823   GLUCOSE 113* 04/13/2014 1823   BUN 17 04/13/2014 1823   CREATININE 0.87 04/13/2014 1823   CALCIUM 9.4 04/13/2014 1823   PROT 7.2 04/13/2014 1823   ALBUMIN 3.3* 04/13/2014 1823   AST 28 04/13/2014 1823   ALT 23 04/13/2014 1823   ALKPHOS 90 04/13/2014 1823   BILITOT 0.5 04/13/2014 1823   GFRNONAA 87* 04/13/2014 1823   GFRAA >90 04/13/2014 1823    No results for input(s): LIPASE, AMYLASE in the last 168 hours. No results for input(s): AMMONIA in the last 168 hours.   Recent Labs Lab 04/13/14 1823 04/13/14 2115 04/13/14 2116 04/14/14 0104  CKTOTAL  --  428*  --   --   CKMB  --  29.0*  --   --   TROPONINI 3.79*  --  6.93* 6.16*   BNP (last 3 results)  Recent Labs  04/13/14 2116  PROBNP 1376.0*    Radiological Exams on Admission: Dg Chest 2 View  04/13/2014   CLINICAL DATA:  Cough for 1 week.  Low-grade fever  EXAM: CHEST  2 VIEW  COMPARISON:  02/19/2014  FINDINGS: Normal mediastinum and cardiac silhouette. Some linear opacity at the left right lung base. No pleural fluid or focal consolidation. Lungs are hyperinflated. Anterior cervical fusion noted.  IMPRESSION: Increased linear densities at lung bases suggest bronchitis.   Electronically Signed   By: Suzy Bouchard M.D.   On: 04/13/2014 19:13   Ct Angio Chest Pe W/cm &/or Wo Cm  04/13/2014   CLINICAL DATA:  Short of breath 1 week. Cough. Concern for pulmonary embolism  EXAM: CT ANGIOGRAPHY CHEST WITH CONTRAST  TECHNIQUE: Multidetector CT imaging of the chest was performed using the standard protocol during bolus administration of intravenous contrast. Multiplanar CT image reconstructions  and MIPs were obtained to evaluate the vascular anatomy.  CONTRAST:  125mL OMNIPAQUE IOHEXOL 350 MG/ML SOLN  COMPARISON:  Radiograph 04/13/2014  FINDINGS: There are no filling defects within the pulmonary arteries to suggest acute pulmonary embolism. No acute findings of the aorta great vessels. No pericardial fluid. Coronary calcifications are noted. Esophagus is normal.  Review of the lung parenchyma demonstrates mild linear nodular branching in the upper lobes. There is more defined branching nodular pattern in the inferior right lower lobe (image 69) and lingula. Airways are normal.  No axillary or supraclavicular lymphadenopathy. No mediastinal hilar lymphadenopathy.  Limited upper abdomen demonstrates normal adrenal glands. Limited view of the skeleton demonstrates no aggressive osseous lesion.  Review of the  MIP images confirms the above findings.  IMPRESSION: 1. No evidence acute pulmonary embolism. 2. Branching nodular pattern in the inferior lingula and lateral right lower lobe and to less extent upper lobes consistent with inflammatory or infectious process. 3. Coronary calcifications.   Electronically Signed   By: Suzy Bouchard M.D.   On: 04/13/2014 20:43   EKG: Independently reviewed. nonspecific ST and T waves changes, sinus tachycardia.  Assessment/Plan Principal Problem:   Sepsis Active Problems:   Psoriasis   Hypoxia   Elevated troponin   Acute bronchitis   Smoker   1. Sepsis The patient is presenting with complaints of shortness of breath and cough. He has significant leukocytosis. He was hypoxic initially and therefore after obtaining a chest x-ray which was unremarkable patient underwent a CT chest PE protocol which was also not showing any blood clot. ED obtained a troponin which was significantly elevated and repeat troponin was also significantly elevated and going in upward direction and therefore cardiology was consulted. Cardiology evaluated the patient and does not  think that the patient has an acute coronary syndrome and things that his troponin is elevated secondary to ongoing infection as process. An echocardiogram does not show any regional wall motion abnormality. With this the patient is currently being admitted in the step down unit. I would continue treating him with broad-spectrum antibiotics vancomycin as well as Levaquin. I would get influenza PCR and respiratory pathogenesis sputum culture and blood culture as well as urine antigens and urine culture. Oxygen as needed.  2. Elevated troponin. Cardiology will be following the continue to follow their recommendation Continue with IV heparin and aspirin.  3. Acute hypoxia. Check ABG. BiPAP as needed. I would treat him with DuoNeb's as well as episodic Medrol.  4. Psoriasis. Hold methotrexate.  Advance goals of care discussion: Full code   Consults: Cardiology, appreciate input  DVT Prophylaxis: Therapeutic anticoagulation Nutrition: Nothing by mouth  Family Communication: Daughter was present at bedside, opportunity was given to ask question and all questions were answered satisfactorily at the time of interview. Disposition: Admitted to inpatient in step-down unit.  Author: Berle Mull, MD [facility] Pager: [phone] 04/14/2014, 5:51 AM    If 7PM-7AM, please contact night-coverage [email or web address] Password [email or web address]

## 2014-04-14 NOTE — Progress Notes (Signed)
Utilization Review Completed.Joseph Shields T12/20/2015  

## 2014-04-14 NOTE — Progress Notes (Signed)
ANTIBIOTIC CONSULT NOTE - INITIAL  Pharmacy Consult for vancomycin Indication: rule out pneumonia  Allergies  Allergen Reactions  . Flonase [Fluticasone Propionate]     Nose bleeds.  . Penicillins     Patient Measurements: Height: 5\' 6"  (167.6 cm) Weight: 195 lb 15.8 oz (88.9 kg) IBW/kg (Calculated) : 63.8  Vital Signs: Temp: 98.2 F (36.8 C) (12/19 1716) Temp Source: Oral (12/19 1716) BP: 142/71 mmHg (12/20 0045) Pulse Rate: 91 (12/20 0045)  Labs:  Recent Labs  04/13/14 1823  WBC 20.3*  HGB 13.1  PLT 254  CREATININE 0.87   Estimated Creatinine Clearance: 86 mL/min (by C-G formula based on Cr of 0.87).  Medical History: Past Medical History  Diagnosis Date  . Psoriasis     Medications:  Prescriptions prior to admission  Medication Sig Dispense Refill Last Dose  . acetaminophen (TYLENOL) 650 MG CR tablet Take 650 mg by mouth every 8 (eight) hours as needed for pain.   04/13/2014 at Unknown time  . aspirin 81 MG tablet Take 81 mg by mouth daily.   Past Week at Unknown time  . azithromycin (ZITHROMAX Z-PAK) 250 MG tablet As directed (Patient taking differently: Take 250-500 mg by mouth See admin instructions. Take two tablets on day 1, and then one tablet on days 2-5) 6 each 0 04/13/2014 at Unknown time  . folic acid (FOLVITE) 1 MG tablet Take 1 mg by mouth daily.   04/13/2014 at Unknown time  . methotrexate (RHEUMATREX) 2.5 MG tablet Take 7.5 mg by mouth once a week.   04/03/14 at Unknown time  . azithromycin (ZITHROMAX) 250 MG tablet 2 pills the first day then 1 daily until completed (Patient not taking: Reported on 04/13/2014) 6 tablet 0 Not Taking  . cetirizine (ZYRTEC) 10 MG tablet Take 10 mg by mouth daily.   unknown  . HYDROcodone-homatropine (HYCODAN) 5-1.5 MG/5ML syrup Take 5 mLs by mouth every 8 (eight) hours as needed for cough. (Patient not taking: Reported on 04/13/2014) 120 mL 0    Scheduled:  . antiseptic oral rinse  7 mL Mouth Rinse BID  . aspirin   81 mg Oral Daily  . folic acid  1 mg Oral Daily  . guaiFENesin  600 mg Oral BID  . ipratropium-albuterol  3 mL Nebulization Q4H  . levofloxacin (LEVAQUIN) IV  750 mg Intravenous Q24H  . methylPREDNISolone (SOLU-MEDROL) injection  60 mg Intravenous Q6H   Infusions:  . heparin 1,000 Units/hr (04/14/14 0100)    Assessment: 68yo male c/o SOB and productive cough over the past few days w/ yellow sputum and low-grade fever, CT c/w inflammatory vs infectious process, actively wheezing, to begin IV ABX.  Goal of Therapy:  Vancomycin trough level 15-20 mcg/ml  Plan:  Rec'd Rocephin 1g in ED; will begin vancomycin 1000mg  IV Q8H and monitor CBC, Cx, levels prn.  Wynona Neat, PharmD, BCPS  04/14/2014,1:06 AM

## 2014-04-15 LAB — COMPREHENSIVE METABOLIC PANEL
ALT: 30 U/L (ref 0–53)
ANION GAP: 14 (ref 5–15)
AST: 32 U/L (ref 0–37)
Albumin: 2.6 g/dL — ABNORMAL LOW (ref 3.5–5.2)
Alkaline Phosphatase: 95 U/L (ref 39–117)
BILIRUBIN TOTAL: 0.2 mg/dL — AB (ref 0.3–1.2)
BUN: 21 mg/dL (ref 6–23)
CHLORIDE: 96 meq/L (ref 96–112)
CO2: 23 meq/L (ref 19–32)
CREATININE: 0.85 mg/dL (ref 0.50–1.35)
Calcium: 9.6 mg/dL (ref 8.4–10.5)
GFR, EST NON AFRICAN AMERICAN: 88 mL/min — AB (ref >90)
GLUCOSE: 156 mg/dL — AB (ref 70–99)
Potassium: 4.3 mEq/L (ref 3.7–5.3)
Sodium: 133 mEq/L — ABNORMAL LOW (ref 137–147)
Total Protein: 6.8 g/dL (ref 6.0–8.3)

## 2014-04-15 LAB — CBC
HEMATOCRIT: 36.3 % — AB (ref 39.0–52.0)
HEMOGLOBIN: 12.1 g/dL — AB (ref 13.0–17.0)
MCH: 31.3 pg (ref 26.0–34.0)
MCHC: 33.3 g/dL (ref 30.0–36.0)
MCV: 94 fL (ref 78.0–100.0)
Platelets: 261 10*3/uL (ref 150–400)
RBC: 3.86 MIL/uL — ABNORMAL LOW (ref 4.22–5.81)
RDW: 13.3 % (ref 11.5–15.5)
WBC: 18.9 10*3/uL — ABNORMAL HIGH (ref 4.0–10.5)

## 2014-04-15 LAB — LIPID PANEL
Cholesterol: 140 mg/dL (ref 0–200)
HDL: 28 mg/dL — ABNORMAL LOW (ref >39)
LDL Cholesterol: 96 mg/dL (ref 0–99)
TRIGLYCERIDES: 82 mg/dL (ref <150)
Total CHOL/HDL Ratio: 5 RATIO
VLDL: 16 mg/dL (ref 0–40)

## 2014-04-15 LAB — HEPARIN LEVEL (UNFRACTIONATED): HEPARIN UNFRACTIONATED: 0.13 [IU]/mL — AB (ref 0.30–0.70)

## 2014-04-15 LAB — HEMOGLOBIN A1C
Hgb A1c MFr Bld: 6 % — ABNORMAL HIGH (ref <5.7)
Mean Plasma Glucose: 126 mg/dL — ABNORMAL HIGH (ref <117)

## 2014-04-15 LAB — MAGNESIUM: MAGNESIUM: 2.3 mg/dL (ref 1.5–2.5)

## 2014-04-15 LAB — APTT: aPTT: 46 seconds — ABNORMAL HIGH (ref 24–37)

## 2014-04-15 MED ORDER — LEVOFLOXACIN 750 MG PO TABS: 750.0000 mg | ORAL_TABLET | Freq: Every day | ORAL | Status: DC

## 2014-04-15 MED ORDER — GUAIFENESIN ER 600 MG PO TB12: 600.0000 mg | ORAL_TABLET | Freq: Two times a day (BID) | ORAL | Status: DC

## 2014-04-15 MED ORDER — CARVEDILOL 3.125 MG PO TABS
3.1250 mg | ORAL_TABLET | Freq: Two times a day (BID) | ORAL | Status: DC
  Filled 2014-04-15 (×2): qty 1

## 2014-04-15 MED ORDER — ATORVASTATIN CALCIUM 10 MG PO TABS
10.0000 mg | ORAL_TABLET | Freq: Every day | ORAL | Status: DC
Start: 2014-04-15 — End: 2014-04-15
  Administered 2014-04-15: 10 mg via ORAL
  Filled 2014-04-15: qty 1

## 2014-04-15 MED ORDER — CARVEDILOL 3.125 MG PO TABS: 3.1250 mg | ORAL_TABLET | Freq: Two times a day (BID) | ORAL | Status: DC

## 2014-04-15 MED ORDER — ATORVASTATIN CALCIUM 10 MG PO TABS: 10.0000 mg | ORAL_TABLET | Freq: Every day | ORAL | Status: DC

## 2014-04-15 MED ORDER — HEPARIN BOLUS VIA INFUSION
2000.0000 [IU] | Freq: Once | INTRAVENOUS | Status: AC
  Administered 2014-04-15: 2000 [IU] via INTRAVENOUS
  Filled 2014-04-15: qty 2000

## 2014-04-15 MED ORDER — LEVOFLOXACIN 750 MG PO TABS
750.0000 mg | ORAL_TABLET | Freq: Every day | ORAL | Status: DC
  Administered 2014-04-15: 750 mg via ORAL
  Filled 2014-04-15: qty 1

## 2014-04-15 MED ORDER — CARVEDILOL 3.125 MG PO TABS
3.1250 mg | ORAL_TABLET | Freq: Two times a day (BID) | ORAL | Status: DC
  Administered 2014-04-15: 3.125 mg via ORAL
  Filled 2014-04-15 (×3): qty 1

## 2014-04-15 NOTE — Progress Notes (Addendum)
SATURATION QUALIFICATIONS: (This note is used to comply with regulatory documentation for home oxygen)  Patient Saturations on Room Air at Rest = 88-95%  Patient Saturations on Room Air while Ambulating = 83-88%  Patient Saturations on 2 Liters of oxygen while Ambulating = 90-94%  Please briefly explain why patient needs home oxygen: at rest in room sats 88-95%, ambulated RA sats dropped as low as 83%, tried O2 at 1L/[place] with sat only up to 88-89%, with 2L/[place] on sats came up to 90-94%. Tolerated walk, tired and legs weak pt stated, denied SOB, denied CP. Steady gait.

## 2014-04-15 NOTE — Progress Notes (Signed)
D/c instructions reviewed with pt and friend at bedside. Copy of instructions and scripts given to pt. Pt home O2 tank delivered and pt d/c'd via wheelchair with O2 2L/[place] on, d/c'd with his belongings, escorted by unit NT.

## 2014-04-15 NOTE — Progress Notes (Signed)
Patient still refuses cath. Plan for d/c home today. Will arrange for cardiology follow-up. Sign-off, call with questions.  Pixie Casino, MD, [facility] Attending Cardiologist [place]

## 2014-04-15 NOTE — Care Management Note (Signed)
    Page 1 of 1   04/15/2014     11:44:12 AM CARE MANAGEMENT NOTE 04/15/2014  Patient:  JAIDIN, RICHISON   Account Number:  [id number]  Date Initiated:  04/15/2014  Documentation initiated by:  Elissa Hefty  Subjective/Objective Assessment:   adm w resp failure and mi     Action/Plan:   lives w fam, pcp dr Rockne Coons   Anticipated DC Date:  04/15/2014   Anticipated DC Plan:  [place]  CM consult      PAC Choice  DURABLE MEDICAL EQUIPMENT   Choice offered to / List presented to:  C-1 Patient   DME arranged  [facility]      DME agency  [place]        Status of service:   Medicare Important Message given?  NA - LOS <3 / Initial given by admissions (If response is "NO", the following Medicare IM given date fields will be blank) Date Medicare IM given:   Medicare IM given by:   Date Additional Medicare IM given:   Additional Medicare IM given by:    Discharge Disposition:  HOME/SELF CARE  Per UR Regulation:  Reviewed for med. necessity/level of care/duration of stay  If discussed at [place] of Stay Meetings, dates discussed:    Comments:  12/21 1142 a debbie Lekita Kerekes rn,bsn spoke w pt and no pref to dme. faxed order for home o2 to [facility] as they have [employer]. port o2 to be del to room.

## 2014-04-15 NOTE — Progress Notes (Signed)
sats on RA at rest with good wave form---- 88-95%

## 2014-04-15 NOTE — Progress Notes (Signed)
ANTICOAGULATION CONSULT NOTE - Follow Up Consult  Pharmacy Consult for heparin Indication: NSTEMI   Labs:  Recent Labs  04/13/14 1823 04/13/14 2115  04/14/14 0104  04/14/14 0252 04/14/14 0735 04/14/14 1240 04/14/14 1951 04/15/14 0252  HGB 13.1  --   --   --   --  13.0  --   --   --  12.1*  HCT 39.6  --   --   --   --  39.4  --   --   --  36.3*  PLT 254  --   --   --   --  247  --   --   --  261  HEPARINUNFRC  --   --   --   --   < > <0.10*  --  0.26* 0.34 0.13*  CREATININE 0.87  --   --   --   --   --  0.80  --   --   --   CKTOTAL  --  428*  --   --   --   --   --   --   --   --   CKMB  --  29.0*  --   --   --   --   --   --   --   --   TROPONINI 3.79*  --   < > 6.16*  --   --  6.38* 4.21*  --   --   < > = values in this interval not displayed.   Assessment: 68yo male now subtherapeutic on heparin after one level at low end of goal, no issue w/ gtt overnight.  Goal of Therapy:  Heparin level 0.3-0.7 units/ml   Plan:  Will give heparin 2000 units IV bolus x1 and increase gtt by 3 units/kg/hr to 1900 units/hr and check level in 6hr.  Wynona Neat, PharmD, BCPS  04/15/2014,4:20 AM

## 2014-04-15 NOTE — Discharge Summary (Signed)
Physician Discharge Summary  Joseph Shields OIZ:[id number] DOB: 06/28/1945 DOA: 04/13/2014  PCP: Chevis Pretty, FNP  Admit date: 04/13/2014 Discharge date: 04/15/2014  Time spent: >30 minutes  Recommendations for Outpatient Follow-up:  1. Please follow up with cardiology to discuss elective cardiac catheterization - was evaluated by Dr. Fransico Him this admission. Spoke with the card master Trish and states office will give patient a call regarding follow-up appointment. 2. RA oxygen sats to 83% so will dc on home oxygen - reassess once acute bronchitis cleared - suspect will only be a temporary need  3. Schedule follow up appointment with your PCP within 1 week after discharge  Discharge Diagnoses:    SIRS   Acute respiratory failure with hypoxia due to acute bronchitis/failed OP therapy   NSTEMI (non-ST elevated myocardial infarction)/refused IP cath   Psoriasis   Smoker   Cough due to acute bronchitis   Discharge Condition: stable  Diet recommendation: Heart Healthy  Filed Weights   04/13/14 1942 04/14/14 0032 04/15/14 0111  Weight: 197 lb 1.5 oz (89.4 kg) 195 lb 15.8 oz (88.9 kg) 196 lb 6.9 oz (89.1 kg)    History of present illness:  68 year old male patient with history of psoriasis on methotrexate. Presented with complaints of cough and fever. Had initially been evaluated by his primary care physician and was started on azithromycin and cough suppressant therapy but after 3 days of treatment symptoms were worsening with dyspnea on exertion and extreme fatigue.  Evaluation in the ER revealed a mildly hypertensive patient with tachycardia.  He was afebrile with room air sats dipping to 83-84%. He had a white count of 20,300. Chest x-ray was suggestive of bronchitis. He also had elevated troponin at 3.79, total CK of 428, and a CKMB of 29. Patient had a prolonged stay in the ER and a second troponin was elevated at 6.16. EKG was nonischemic and only demonstrated  sinus tachycardia with nonspecific ST abnormality. Cardiology was consulted. A stat echo was completed in the ER which revealed normal LVEF without any obvious wall motion abnormalities. CT of the chest had also been accomplished and demonstrated coronary artery calcifications. No indications for emergent catheterization per Cardiology. Because of active wheezing at time of presentation beta blockers were not initiated.   Hospital Course:   NSTEMI Evaluated by cardiology this admission. Initially no beta blockers due to active wheezing at presentation. Heparin drip was initiated. Lipid panel revealed suboptimal HDL at 28. Statin therapy initiated. Cardiology offered cardiac catheterization to the patient but he refused. He is amenable to proceeding with outpatient cardiac catheterization. Patient was ambulated on room air to determine if oxygen therapy would be indicated at discharge. He did not have any chest pain or shortness of breath with ambulation even when O2 sat 83%. Cardiac enzymes trended downward last troponin was 4.21 on 12/20. Our recommendation is that the patient follow-up with cardiology as an outpatient to pursue cardiac catheterization once current respiratory symptoms have resolved. Since no wheezing on date of discharge we have ordered a selective beta blocker carvedilol to begin at 3.125 mg twice a day.  Acute hypoxemic respiratory failure secondary to bronchitis Primary reason for presentation. Was hypoxemic with sats in the mid 80s in the ER. Had previously been treated with 3 days of Zithromax without improvement in symptoms. Started on Levaquin this admission which we'll continue for total of 7 days after discharge. Sputum culture pending at discharge. Gram stain revealed abundant gram negative rods and moderate gram  positive cocci in pairs/clusters. No further wheezing since steroids have been tapered and weaned. No indication at this time to continue steroid taper using  prednisone. On room air patient had an ambulatory sats down to 83% so home oxygen has been ordered. No wheezing with ambulation. We'll continue guaifenesin and cough suppression therapy as prior to admission.  Sats w/ ambulation will need to be reassessed once the pt has recovered to his baseline, as it is anticipated that he will NOT need long term O2 support.    Psoriasis on methotrexate Patient had previously held last dosage of methotrexate because he felt himself getting sick and will not resume this medication until current symptoms have resolved.  Nicotine abuse Patient counseled on sequelae of continuing to smoke including death by Dr. Sherral Hammers, as well as Dr. Thereasa Solo  Procedures:  2-D echocardiogram:   - Left ventricle: The cavity size was normal. Wall thickness was normal. Systolic function was normal. The estimated ejection fraction was in the range of 55% to 60%. GLPSS is mildly reduced at -18%, there is focal inferoseptal strain abnormality. Left ventricular diastolic function parameters were normal. - Aortic valve: Sclerosis without stenosis. There was no significant regurgitation. - Mitral valve: Calcified annulus. There was trivial regurgitation. - Left atrium: The atrium was normal in size. - Right atrium: The atrium was mildly dilated.  Consultations:  Cardiology/Dr. Radford Pax  Discharge Exam: Filed Vitals:   04/15/14 1119  BP: 129/65  Pulse: 70  Temp: 97.9 F (36.6 C)  Resp: 18   General: No acute respiratory distress Lungs: Slightly decreased air movement especially in the bases, negative wheezes or crackles-2 L oxygen; room air sats at rest anywhere between 88 and 93%-ambulatory sats on room air with decreases to 83-84% with fatigue endorsed-sats improved to 92-94% once 2 L oxygen reapplied Cardiovascular: Regular rate and rhythm without murmur gallop or rub normal S1 and S2 Abdomen: Nontender, nondistended, soft, bowel sounds positive, no rebound, no ascites, no  appreciable mass Extremities: No significant cyanosis, clubbing, or edema bilateral lower extremities   Current Discharge Medication List    START taking these medications   Details  atorvastatin (LIPITOR) 10 MG tablet Take 1 tablet (10 mg total) by mouth daily at 6 PM. Qty: 30 tablet, Refills: 0    carvedilol (COREG) 3.125 MG tablet Take 1 tablet (3.125 mg total) by mouth 2 (two) times daily with a meal. Qty: 60 tablet, Refills: 0    guaiFENesin (MUCINEX) 600 MG 12 hr tablet Take 1 tablet (600 mg total) by mouth 2 (two) times daily. Qty: 60 tablet, Refills: 0    levofloxacin (LEVAQUIN) 750 MG tablet Take 1 tablet (750 mg total) by mouth daily. Qty: 7 tablet, Refills: 0      CONTINUE these medications which have NOT CHANGED   Details  acetaminophen (TYLENOL) 650 MG CR tablet Take 650 mg by mouth every 8 (eight) hours as needed for pain.    aspirin 81 MG tablet Take 81 mg by mouth daily.    folic acid (FOLVITE) 1 MG tablet Take 1 mg by mouth daily.    methotrexate (RHEUMATREX) 2.5 MG tablet Take 7.5 mg by mouth once a week.    cetirizine (ZYRTEC) 10 MG tablet Take 10 mg by mouth daily.    HYDROcodone-homatropine (HYCODAN) 5-1.5 MG/5ML syrup Take 5 mLs by mouth every 8 (eight) hours as needed for cough. Qty: 120 mL, Refills: 0   Associated Diagnoses: Acute upper respiratory infection      STOP taking  these medications     azithromycin (ZITHROMAX Z-PAK) 250 MG tablet      azithromycin (ZITHROMAX) 250 MG tablet        Allergies  Allergen Reactions  . Flonase [Fluticasone Propionate]     Nose bleeds.  . Penicillins     Significant Diagnostic Studies: Dg Chest 2 View  04/13/2014   CLINICAL DATA:  Cough for 1 week.  Low-grade fever  EXAM: CHEST  2 VIEW  COMPARISON:  02/19/2014  FINDINGS: Normal mediastinum and cardiac silhouette. Some linear opacity at the left right lung base. No pleural fluid or focal consolidation. Lungs are hyperinflated. Anterior cervical fusion  noted.  IMPRESSION: Increased linear densities at lung bases suggest bronchitis.   Electronically Signed   By: Suzy Bouchard M.D.   On: 04/13/2014 19:13   Ct Angio Chest Pe W/cm &/or Wo Cm  04/13/2014   CLINICAL DATA:  Short of breath 1 week. Cough. Concern for pulmonary embolism  EXAM: CT ANGIOGRAPHY CHEST WITH CONTRAST  TECHNIQUE: Multidetector CT imaging of the chest was performed using the standard protocol during bolus administration of intravenous contrast. Multiplanar CT image reconstructions and MIPs were obtained to evaluate the vascular anatomy.  CONTRAST:  146mL OMNIPAQUE IOHEXOL 350 MG/ML SOLN  COMPARISON:  Radiograph 04/13/2014  FINDINGS: There are no filling defects within the pulmonary arteries to suggest acute pulmonary embolism. No acute findings of the aorta great vessels. No pericardial fluid. Coronary calcifications are noted. Esophagus is normal.  Review of the lung parenchyma demonstrates mild linear nodular branching in the upper lobes. There is more defined branching nodular pattern in the inferior right lower lobe (image 69) and lingula. Airways are normal.  No axillary or supraclavicular lymphadenopathy. No mediastinal hilar lymphadenopathy.  Limited upper abdomen demonstrates normal adrenal glands. Limited view of the skeleton demonstrates no aggressive osseous lesion.  Review of the MIP images confirms the above findings.  IMPRESSION: 1. No evidence acute pulmonary embolism. 2. Branching nodular pattern in the inferior lingula and lateral right lower lobe and to less extent upper lobes consistent with inflammatory or infectious process. 3. Coronary calcifications.   Electronically Signed   By: Suzy Bouchard M.D.   On: 04/13/2014 20:43    Microbiology: Recent Results (from the past 240 hour(s))  MRSA PCR Screening     Status: None   Collection Time: 04/14/14 12:28 AM  Result Value Ref Range Status   MRSA by PCR NEGATIVE NEGATIVE Final    Comment:        The GeneXpert  MRSA Assay (FDA approved for NASAL specimens only), is one component of a comprehensive MRSA colonization surveillance program. It is not intended to diagnose MRSA infection nor to guide or monitor treatment for MRSA infections.   Culture, blood (routine x 2) Call MD if unable to obtain prior to antibiotics being given     Status: None (Preliminary result)   Collection Time: 04/14/14  1:36 AM  Result Value Ref Range Status   Specimen Description BLOOD RIGHT HAND  Final   Special Requests BOTTLES DRAWN AEROBIC ONLY 2.5CC  Final   Culture  Setup Time   Final    04/14/2014 04:13 Performed at [facility]    Culture   Final           BLOOD CULTURE RECEIVED NO GROWTH TO DATE CULTURE WILL BE HELD FOR 5 DAYS BEFORE ISSUING A FINAL NEGATIVE REPORT Performed at [facility]    Report Status PENDING  Incomplete  Culture,  blood (routine x 2) Call MD if unable to obtain prior to antibiotics being given     Status: None (Preliminary result)   Collection Time: 04/14/14  1:37 AM  Result Value Ref Range Status   Specimen Description BLOOD RIGHT HAND  Final   Special Requests BOTTLES DRAWN AEROBIC ONLY 2.5CC  Final   Culture  Setup Time   Final    04/14/2014 04:13 Performed at [facility]    Culture   Final           BLOOD CULTURE RECEIVED NO GROWTH TO DATE CULTURE WILL BE HELD FOR 5 DAYS BEFORE ISSUING A FINAL NEGATIVE REPORT Performed at [facility]    Report Status PENDING  Incomplete  Culture, sputum-assessment     Status: None   Collection Time: 04/14/14  2:08 AM  Result Value Ref Range Status   Specimen Description SPUTUM  Final   Special Requests NONE  Final   Sputum evaluation   Final    MICROSCOPIC FINDINGS SUGGEST THAT THIS SPECIMEN IS NOT REPRESENTATIVE OF LOWER RESPIRATORY SECRETIONS. PLEASE RECOLLECT. CALLED TO CORO,J RN AT [phone] 04/14/14 BY MITCHELL,L    Report Status 04/14/2014 FINAL  Final  Culture, expectorated sputum-assessment      Status: None   Collection Time: 04/14/14  4:50 AM  Result Value Ref Range Status   Specimen Description SPUTUM  Final   Special Requests NONE  Final   Sputum evaluation   Final    THIS SPECIMEN IS ACCEPTABLE. RESPIRATORY CULTURE REPORT TO FOLLOW.   Report Status 04/14/2014 FINAL  Final  Culture, respiratory (NON-Expectorated)     Status: None (Preliminary result)   Collection Time: 04/14/14  4:50 AM  Result Value Ref Range Status   Specimen Description SPUTUM  Final   Special Requests ADDED [phone]  Final   Gram Stain   Final    MODERATE WBC PRESENT, PREDOMINANTLY PMN FEW SQUAMOUS EPITHELIAL CELLS PRESENT ABUNDANT GRAM NEGATIVE RODS MODERATE GRAM POSITIVE COCCI IN PAIRS IN CLUSTERS    Culture   Final    Culture reincubated for better growth Performed at [facility]    Report Status PENDING  Incomplete     Labs: Basic Metabolic Panel:  Recent Labs Lab 04/13/14 1823 04/14/14 0735 04/15/14 0252  NA 138 138 133*  K 4.2 4.7 4.3  CL 99 99 96  CO2 25 27 23   GLUCOSE 113* 146* 156*  BUN 17 15 21   CREATININE 0.87 0.80 0.85  CALCIUM 9.4 9.2 9.6  MG  --   --  2.3   Liver Function Tests:  Recent Labs Lab 04/13/14 1823 04/14/14 0735 04/15/14 0252  AST 28 39* 32  ALT 23 28 30   ALKPHOS 90 88 95  BILITOT 0.5 0.4 0.2*  PROT 7.2 7.1 6.8  ALBUMIN 3.3* 2.9* 2.6*   CBC:  Recent Labs Lab 04/13/14 1823 04/14/14 0252 04/15/14 0252  WBC 20.3* 18.2* 18.9*  NEUTROABS 14.9*  --   --   HGB 13.1 13.0 12.1*  HCT 39.6 39.4 36.3*  MCV 94.7 97.5 94.0  PLT 254 247 261   Cardiac Enzymes:  Recent Labs Lab 04/13/14 1823 04/13/14 2115 04/13/14 2116 04/14/14 0104 04/14/14 0735 04/14/14 1240  CKTOTAL  --  428*  --   --   --   --   CKMB  --  29.0*  --   --   --   --   TROPONINI 3.79*  --  6.93* 6.16* 6.38* 4.21*  BNP: BNP (last 3 results)  Recent Labs  04/13/14 2116  PROBNP 1376.0*   Signed:  ELLIS,ALLISON L. ANP [facility] 04/15/2014, 11:32  AM  I have personally examined this patient and reviewed the entire database. I have reviewed the above note, made any necessary editorial changes, and agree with its content.  Cherene Altes, MD [facility]

## 2014-04-15 NOTE — Discharge Instructions (Signed)
Acute Coronary Syndrome Acute coronary syndrome (ACS) is an urgent problem in which the blood and oxygen supply to the heart is critically deficient. ACS requires hospitalization because one or more coronary arteries may be blocked. ACS represents a range of conditions including:  Previous angina that is now unstable, lasts longer, happens at rest, or is more intense.  A heart attack, with heart muscle cell injury and death. There are three vital coronary arteries that supply the heart muscle with blood and oxygen so that it can pump blood effectively. If blockages to these arteries develop, blood flow to the heart muscle is reduced. If the heart does not get enough blood, angina may occur as the first warning sign. SYMPTOMS   The most common signs of angina include:  Tightness or squeezing in the chest.  Feeling of heaviness on the chest.  Discomfort in the arms, neck, back, or jaw.  Shortness of breath and nausea.  Cold, wet skin.  Angina is usually brought on by physical effort or excitement which increase the oxygen needs of the heart. These states increase the blood flow needs of the heart beyond what can be delivered.  Other symptoms that are not as common include:  Fatigue  Unexplained feelings of nervousness or anxiety  Weakness  Diarrhea  Sometimes, you may not have noticed any symptoms at all but still suffered a cardiac injury. TREATMENT   Medicines to help discomfort may include nitroglycerin (nitro) in the form of tablets or a spray for rapid relief, or longer-acting forms such as cream, patches, or capsules. (Be aware that there are many side effects and possible interactions with other drugs).  Other medicines may be used to help the heart pump better.  Procedures to open blocked arteries including angioplasty or stent placement to keep the arteries open.  Open heart surgery may be needed when there are many blockages or they are in critical locations that  are best treated with surgery. HOME CARE INSTRUCTIONS   Do not use any tobacco products including cigarettes, chewing tobacco, or electronic cigarettes.  Take one baby or adult aspirin daily, if your health care provider advises. This helps reduce the risk of a heart attack.  It is very important that you follow the angina treatment prescribed by your health care provider. Make arrangements for proper follow-up care.  Eat a heart healthy diet with salt and fat restrictions as advised.  Regular exercise is good for you as long as it does not cause discomfort. Do not begin any new type of exercise until you check with your health care provider.  If you are overweight, you should lose weight.  Try to maintain normal blood lipid levels.  Keep your blood pressure under control as recommended by your health care provider.  You should tell your health care provider right away about any increase in the severity or frequency of your chest discomfort or angina attacks. When you have angina, you should stop what you are doing and sit down. This may bring relief in 3 to 5 minutes. If your health care provider has prescribed nitro, take it as directed.  If your health care provider has given you a follow-up appointment, it is very important to keep that appointment. Not keeping the appointment could result in a chronic or permanent injury, pain, and disability. If there is any problem keeping the appointment, you must call back to this facility for assistance. SEEK IMMEDIATE MEDICAL CARE IF:   You develop nausea, vomiting, or shortness  of breath.  You feel faint, lightheaded, or pass out.  Your chest discomfort gets worse.  You are sweating or experience sudden profound fatigue.  You do not get relief of your chest pain after 3 doses of nitro.  Your discomfort lasts longer than 15 minutes. MAKE SURE YOU:   Understand these instructions.  Will watch your condition.  Will get help right  away if you are not doing well or get worse.  Take all medicines as directed by your health care provider. Document Released: 04/12/2005 Document Revised: 04/17/2013 Document Reviewed: 08/14/2013 [facility] Patient Information 2015 [place], [place]. This information is not intended to replace advice given to you by your health care provider. Make sure you discuss any questions you have with your health care provider.  Acute Respiratory Failure Respiratory failure is when your lungs are not working well and your breathing (respiratory) system fails. When respiratory failure occurs, it is difficult for your lungs to get enough oxygen, get rid of carbon dioxide, or both. Respiratory failure can be life threatening.  Respiratory failure can be acute or chronic. Acute respiratory failure is sudden, severe, and requires emergency medical treatment. Chronic respiratory failure is less severe, happens over time, and requires ongoing treatment.  WHAT ARE THE CAUSES OF ACUTE RESPIRATORY FAILURE?  Any problem affecting the heart or lungs can cause acute respiratory failure. Some of these causes include the following:  Chronic bronchitis and emphysema (COPD).   Blood clot going to a lung (pulmonary embolism).   Having water in the lungs caused by heart failure, lung injury, or infection (pulmonary edema).   Collapsed lung (pneumothorax).   Pneumonia.   Pulmonary fibrosis.   Obesity.   Asthma.   Heart failure.   Any type of trauma to the chest that can make breathing difficult.   Nerve or muscle diseases making chest movements difficult. WHAT SYMPTOMS SHOULD YOU WATCH FOR?  If you have any of these signs or symptoms, you should seek immediate medical care:   You have shortness of breath (dyspnea) with or without activity.   You have rapid, fast breathing (tachypnea).   You are wheezing.  You are unable to say more than a few words without having to catch your breath.  You find  it very difficult to function normally.  You have a fast heart rate.   You have a bluish color to your finger or toe nail beds.   You have confusion or drowsiness or both.  HOW WILL MY ACUTE RESPIRATORY FAILURE BE TREATED?  Treatment of acute respiratory failure depends on the cause of the respiratory failure. Usually, you will stay in the intensive care unit so your breathing can be watched closely. Treatment can include the following:  Oxygen. Oxygen can be delivered through the following:  Nasal cannula. This is small tubing that goes in your nose to give you oxygen.  Face mask. A face mask covers your nose and mouth to give you oxygen.  Medicine. Different medicines can be given to help with breathing. These can include:  Nebulizers. Nebulizers deliver medicines to open the air passages (bronchodilators). These medicines help to open or relax the airways in the lungs so you can breathe better. They can also help loosen mucus from your lungs.  Diuretics. Diuretic medicines can help you breathe better by getting rid of extra water in your body.  Steroids. Steroid medicines can help decrease swelling (inflammation) in your lungs.  Antibiotics.  Chest tube. If you have a collapsed lung (pneumothorax),  a chest tube is placed to help reinflate the lung.  Non-invasive positive pressure ventilation (NPPV). This is a tight-fitting mask that goes over your nose and mouth. The mask has tubing that is attached to a machine. The machine blows air into the tubing, which helps to keep the tiny air sacs (alveoli) in your lungs open. This machine allows you to breathe on your own.  Ventilator. A ventilator is a breathing machine. When on a ventilator, a breathing tube is put into the lungs. A ventilator is used when you can no longer breathe well enough on your own. You may have low oxygen levels or high carbon dioxide (CO2) levels in your blood. When you are on a ventilator, sedation and pain  medicines are given to make you sleep so your lungs can heal. Document Released: 04/17/2013 Document Revised: 08/27/2013 Document Reviewed: 04/17/2013 [facility] Patient Information 2015 [place], [place]. This information is not intended to replace advice given to you by your health care provider. Make sure you discuss any questions you have with your health care provider. Coronary Angiogram A coronary angiogram, also called coronary angiography, is an X-ray procedure used to look at the arteries in the heart. In this procedure, a dye (contrast dye) is injected through a long, hollow tube (catheter). The catheter is about the size of a piece of cooked spaghetti and is inserted through your groin, wrist, or arm. The dye is injected into each artery, and X-rays are then taken to show if there is a blockage in the arteries of your heart. LET [facility] CARE PROVIDER KNOW ABOUT:  Any allergies you have, including allergies to shellfish or contrast dye.   All medicines you are taking, including vitamins, herbs, eye drops, creams, and over-the-counter medicines.   Previous problems you or members of your family have had with the use of anesthetics.   Any blood disorders you have.   Previous surgeries you have had.  History of kidney problems or failure.   Other medical conditions you have. RISKS AND COMPLICATIONS  Generally, a coronary angiogram is a safe procedure. However, problems can occur and include:  Allergic reaction to the dye.  Bleeding from the access site or other locations.  Kidney injury, especially in people with impaired kidney function.  Stroke (rare).  Heart attack (rare). BEFORE THE PROCEDURE   Do not eat or drink anything after midnight the night before the procedure or as directed by your health care provider.   Ask your health care provider about changing or stopping your regular medicines. This is especially important if you are taking diabetes medicines or  blood thinners. PROCEDURE  You may be given a medicine to help you relax (sedative) before the procedure. This medicine is given through an intravenous (IV) access tube that is inserted into one of your veins.   The area where the catheter will be inserted will be washed and shaved. This is usually done in the groin but may be done in the fold of your arm (near your elbow) or in the wrist.   A medicine will be given to numb the area where the catheter will be inserted (local anesthetic).   The health care provider will insert the catheter into an artery. The catheter will be guided by using a special type of X-ray (fluoroscopy) of the blood vessel being examined.   A special dye will then be injected into the catheter, and X-rays will be taken. The dye will help to show where any narrowing  or blockages are located in the heart arteries.  AFTER THE PROCEDURE   If the procedure is done through the leg, you will be kept in bed lying flat for several hours. You will be instructed to not bend or cross your legs.  The insertion site will be checked frequently.   The pulse in your feet or wrist will be checked frequently.   Additional blood tests, X-rays, and an electrocardiogram may be done.  Document Released: 10/17/2002 Document Revised: 08/27/2013 Document Reviewed: 09/04/2012 [facility] Patient Information 2015 [place], [place]. This information is not intended to replace advice given to you by your health care provider. Make sure you discuss any questions you have with your health care provider. Acute Bronchitis Bronchitis is inflammation of the airways that extend from the windpipe into the lungs (bronchi). The inflammation often causes mucus to develop. This leads to a cough, which is the most common symptom of bronchitis.  In acute bronchitis, the condition usually develops suddenly and goes away over time, usually in a couple weeks. Smoking, allergies, and asthma can make bronchitis  worse. Repeated episodes of bronchitis may cause further lung problems.  CAUSES Acute bronchitis is most often caused by the same virus that causes a cold. The virus can spread from person to person (contagious) through coughing, sneezing, and touching contaminated objects. SIGNS AND SYMPTOMS   Cough.   Fever.   Coughing up mucus.   Body aches.   Chest congestion.   Chills.   Shortness of breath.   Sore throat.  DIAGNOSIS  Acute bronchitis is usually diagnosed through a physical exam. Your health care provider will also ask you questions about your medical history. Tests, such as chest X-rays, are sometimes done to rule out other conditions.  TREATMENT  Acute bronchitis usually goes away in a couple weeks. Oftentimes, no medical treatment is necessary. Medicines are sometimes given for relief of fever or cough. Antibiotic medicines are usually not needed but may be prescribed in certain situations. In some cases, an inhaler may be recommended to help reduce shortness of breath and control the cough. A cool mist vaporizer may also be used to help thin bronchial secretions and make it easier to clear the chest.  HOME CARE INSTRUCTIONS  Get plenty of rest.   Drink enough fluids to keep your urine clear or pale yellow (unless you have a medical condition that requires fluid restriction). Increasing fluids may help thin your respiratory secretions (sputum) and reduce chest congestion, and it will prevent dehydration.   Take medicines only as directed by your health care provider.  If you were prescribed an antibiotic medicine, finish it all even if you start to feel better.  Avoid smoking and secondhand smoke. Exposure to cigarette smoke or irritating chemicals will make bronchitis worse. If you are a smoker, consider using nicotine gum or skin patches to help control withdrawal symptoms. Quitting smoking will help your lungs heal faster.   Reduce the chances of another bout  of acute bronchitis by washing your hands frequently, avoiding people with cold symptoms, and trying not to touch your hands to your mouth, nose, or eyes.   Keep all follow-up visits as directed by your health care provider.  SEEK MEDICAL CARE IF: Your symptoms do not improve after 1 week of treatment.  SEEK IMMEDIATE MEDICAL CARE IF:  You develop an increased fever or chills.   You have chest pain.   You have severe shortness of breath.  You have bloody sputum.  You develop dehydration.  You faint or repeatedly feel like you are going to pass out.  You develop repeated vomiting.  You develop a severe headache. MAKE SURE YOU:   Understand these instructions.  Will watch your condition.  Will get help right away if you are not doing well or get worse. Document Released: 05/20/2004 Document Revised: 08/27/2013 Document Reviewed: 10/03/2012 [facility] Patient Information 2015 [place], [place]. This information is not intended to replace advice given to you by your health care provider. Make sure you discuss any questions you have with your health care provider. Carvedilol tablets What is this medicine? CARVEDILOL (KAR ve dil ol) is a beta-blocker. Beta-blockers reduce the workload on the heart and help it to beat more regularly. This medicine is used to treat high blood pressure and heart failure. This medicine may be used for other purposes; ask your health care provider or pharmacist if you have questions. COMMON BRAND NAME(S): Coreg What should I tell my health care provider before I take this medicine? They need to know if you have any of these conditions: -circulation problems -diabetes -history of heart attack or heart disease -liver disease -lung or breathing disease, like asthma or emphysema -pheochromocytoma -slow or irregular heartbeat -thyroid disease -an unusual or allergic reaction to carvedilol, other beta-blockers, medicines, foods, dyes, or  preservatives -pregnant or trying to get pregnant -breast-feeding How should I use this medicine? Take this medicine by mouth with a glass of water. Follow the directions on the prescription label. It is best to take the tablets with food. Take your doses at regular intervals. Do not take your medicine more often than directed. Do not stop taking except on the advice of your doctor or health care professional. Talk to your pediatrician regarding the use of this medicine in children. Special care may be needed. Overdosage: If you think you have taken too much of this medicine contact a poison control center or emergency room at once. NOTE: This medicine is only for you. Do not share this medicine with others. What if I miss a dose? If you miss a dose, take it as soon as you can. If it is almost time for your next dose, take only that dose. Do not take double or extra doses. What may interact with this medicine? This medicine may interact with the following medications: -certain medicines for blood pressure, heart disease, irregular heart beat -certain medicines for depression, like fluoxetine or paroxetine -certain medicines for diabetes, like glipizide or glyburide -cimetidine -clonidine -cyclosporine -digoxin -MAOIs like Carbex, Eldepryl, Marplan, Nardil, and Parnate -reserpine -rifampin This list may not describe all possible interactions. Give your health care provider a list of all the medicines, herbs, non-prescription drugs, or dietary supplements you use. Also tell them if you smoke, drink alcohol, or use illegal drugs. Some items may interact with your medicine. What should I watch for while using this medicine? Check your heart rate and blood pressure regularly while you are taking this medicine. Ask your doctor or health care professional what your heart rate and blood pressure should be, and when you should contact him or her. Do not stop taking this medicine suddenly. This could  lead to serious heart-related effects. Contact your doctor or health care professional if you have difficulty breathing while taking this drug. Check your weight daily. Ask your doctor or health care professional when you should notify him/her of any weight gain. You may get drowsy or dizzy. Do not drive, use machinery, or do anything  that requires mental alertness until you know how this medicine affects you. To reduce the risk of dizzy or fainting spells, do not sit or stand up quickly. Alcohol can make you more drowsy, and increase flushing and rapid heartbeats. Avoid alcoholic drinks. If you have diabetes, check your blood sugar as directed. Tell your doctor if you have changes in your blood sugar while you are taking this medicine. If you are going to have surgery, tell your doctor or health care professional that you are taking this medicine. What side effects may I notice from receiving this medicine? Side effects that you should report to your doctor or health care professional as soon as possible: -allergic reactions like skin rash, itching or hives, swelling of the face, lips, or tongue -breathing problems -dark urine -irregular heartbeat -swollen legs or ankles -vomiting -yellowing of the eyes or skin Side effects that usually do not require medical attention (report to your doctor or health care professional if they continue or are bothersome): -change in sex drive or performance -diarrhea -dry eyes (especially if wearing contact lenses) -dry, itching skin -headache -nausea -unusually tired This list may not describe all possible side effects. Call your doctor for medical advice about side effects. You may report side effects to FDA at 1-800-FDA-1088. Where should I keep my medicine? Keep out of the reach of children. Store at room temperature below 30 degrees C (86 degrees F). Protect from moisture. Keep container tightly closed. Throw away any unused medicine after the  expiration date. NOTE: This sheet is a summary. It may not cover all possible information. If you have questions about this medicine, talk to your doctor, pharmacist, or health care provider.  2015, [employer]. (2012-12-17 14:12:02) Dyslipidemia Dyslipidemia is an imbalance of the lipids in your blood. Lipids are waxy, fat-like proteins that your body needs in small amounts. Dyslipidemia often involves the lipids cholesterol or triglycerides. Common forms of dyslipidemia are:  High levels of bad cholesterol (LDL cholesterol). LDL cholesterol is the type of cholesterol that causes heart disease.  Low levels of good cholesterol (HDL cholesterol). HDL cholesterol is the type of cholesterol that helps protect against heart disease.  High levels of triglycerides. Triglycerides are a fatty substance in the blood linked to a buildup of plaque on your arteries. RISK FACTORS  Increased age.  Having a family history of high cholesterol.  Certain medicines, including birth control pills, diuretics, beta-blockers, and some medicines for depression.  Smoking.  Eating a high-fat diet.  Being overweight.  Medical conditions such as diabetes, polycystic ovary syndrome, pregnancy, kidney disease, and hypothyroidism.  Lack of regular exercise. SIGNS AND SYMPTOMS There are no signs or symptoms with dyslipidemia.  DIAGNOSIS  A simple blood test called a fasting blood test can be done to determine your level of:  Total cholesterol. This is the combined number of LDL cholesterol and HDL cholesterol. A healthy number is lower than 200.  LDL cholesterol. The goal number for LDL cholesterol is different for each person depending on risk factors. Ask your health care provider what your LDL cholesterol number should be.  HDL cholesterol. A healthy level of HDL cholesterol is 60 or higher. A number lower than 40 for men or 50 for women is a danger sign.  Triglycerides. A healthy triglyceride  number is less than 150. TREATMENT  Dyslipidemia is a treatable condition. Your health care provider will advise you on what type of treatment is best based on your age, your test results, and  current guidelines. Treatment may include:   Dietary changes. A dietitian can help you create a meal plan. You may need to:  Eat more foods that contain omega-3s, such as salmon and other fish.  Replace saturated fats and trans fats in your diet with healthy fats such as nuts, seeds, avocados, olive oil, and canola oil.  Regular exercise. This can help lower your LDL cholesterol, raise your HDL cholesterol, and help with weight management. Check with your health care provider before beginning an exercise program. Most people should participate in 30 minutes of brisk exercise 5 days a week.  Quitting smoking.  Medicines to lower LDL cholesterol and triglycerides. Your health care provider will monitor your lipid levels with regular blood tests. HOME CARE INSTRUCTIONS  Eat a healthy diet. Follow any diet instructions if they were given to you by your health care provider.  Maintain a healthy weight.  Exercise regularly based on the recommendations of your health care provider.  Do not use any tobacco products, including cigarettes, chewing tobacco, or electronic cigarettes.  Take medicines only as directed by your health care provider.  Keep all follow-up visits as directed by your health care provider. SEEK MEDICAL CARE IF: You are having possible side effects from your medicines. Document Released: 04/17/2013 Document Revised: 08/27/2013 Document Reviewed: 04/17/2013 [facility] Patient Information 2015 [place], [place]. This information is not intended to replace advice given to you by your health care provider. Make sure you discuss any questions you have with your health care provider. Atorvastatin tablets What is this medicine? ATORVASTATIN (a TORE va sta tin) is known as a HMG-CoA reductase  inhibitor or 'statin'. It lowers the level of cholesterol and triglycerides in the blood. This drug may also reduce the risk of heart attack, stroke, or other health problems in patients with risk factors for heart disease. Diet and lifestyle changes are often used with this drug. This medicine may be used for other purposes; ask your health care provider or pharmacist if you have questions. COMMON BRAND NAME(S): Lipitor What should I tell my health care provider before I take this medicine? They need to know if you have any of these conditions: -frequently drink alcoholic beverages -history of stroke, TIA -kidney disease -liver disease -muscle aches or weakness -other medical condition -an unusual or allergic reaction to atorvastatin, other medicines, foods, dyes, or preservatives -pregnant or trying to get pregnant -breast-feeding How should I use this medicine? Take this medicine by mouth with a glass of water. Follow the directions on the prescription label. You can take this medicine with or without food. Take your doses at regular intervals. Do not take your medicine more often than directed. Talk to your pediatrician regarding the use of this medicine in children. While this drug may be prescribed for children as young as 80 years old for selected conditions, precautions do apply. Overdosage: If you think you have taken too much of this medicine contact a poison control center or emergency room at once. NOTE: This medicine is only for you. Do not share this medicine with others. What if I miss a dose? If you miss a dose, take it as soon as you can. If it is almost time for your next dose, take only that dose. Do not take double or extra doses. What may interact with this medicine? Do not take this medicine with any of the following medications: -red yeast rice -telaprevir -telithromycin -voriconazole This medicine may also interact with the following  medications: -alcohol -antiviral  medicines for HIV or AIDS -boceprevir -certain antibiotics like clarithromycin, erythromycin, troleandomycin -certain medicines for cholesterol like fenofibrate or gemfibrozil -cimetidine -clarithromycin -colchicine -cyclosporine -digoxin -male hormones, like estrogens or progestins and birth control pills -grapefruit juice -medicines for fungal infections like fluconazole, itraconazole, ketoconazole -niacin -rifampin -spironolactone This list may not describe all possible interactions. Give your health care provider a list of all the medicines, herbs, non-prescription drugs, or dietary supplements you use. Also tell them if you smoke, drink alcohol, or use illegal drugs. Some items may interact with your medicine. What should I watch for while using this medicine? Visit your doctor or health care professional for regular check-ups. You may need regular tests to make sure your liver is working properly. Tell your doctor or health care professional right away if you get any unexplained muscle pain, tenderness, or weakness, especially if you also have a fever and tiredness. Your doctor or health care professional may tell you to stop taking this medicine if you develop muscle problems. If your muscle problems do not go away after stopping this medicine, contact your health care professional. This drug is only part of a total heart-health program. Your doctor or a dietician can suggest a low-cholesterol and low-fat diet to help. Avoid alcohol and smoking, and keep a proper exercise schedule. Do not use this drug if you are pregnant or breast-feeding. Serious side effects to an unborn child or to an infant are possible. Talk to your doctor or pharmacist for more information. This medicine may affect blood sugar levels. If you have diabetes, check with your doctor or health care professional before you change your diet or the dose of your diabetic medicine. If  you are going to have surgery tell your health care professional that you are taking this drug. What side effects may I notice from receiving this medicine? Side effects that you should report to your doctor or health care professional as soon as possible: -allergic reactions like skin rash, itching or hives, swelling of the face, lips, or tongue -dark urine -fever -joint pain -muscle cramps, pain -redness, blistering, peeling or loosening of the skin, including inside the mouth -trouble passing urine or change in the amount of urine -unusually weak or tired -yellowing of eyes or skin Side effects that usually do not require medical attention (report to your doctor or health care professional if they continue or are bothersome): -constipation -heartburn -stomach gas, pain, upset This list may not describe all possible side effects. Call your doctor for medical advice about side effects. You may report side effects to FDA at 1-800-FDA-1088. Where should I keep my medicine? Keep out of the reach of children. Store at room temperature between 20 to 25 degrees C (68 to 77 degrees F). Throw away any unused medicine after the expiration date. NOTE: This sheet is a summary. It may not cover all possible information. If you have questions about this medicine, talk to your doctor, pharmacist, or health care provider.  2015, [employer] Standard. (2011-03-02 85:27:78) Levofloxacin tablets What is this medicine? LEVOFLOXACIN (lee voe FLOX a sin) is a quinolone antibiotic. It is used to treat certain kinds of bacterial infections. It will not work for colds, flu, or other viral infections. This medicine may be used for other purposes; ask your health care provider or pharmacist if you have questions. COMMON BRAND NAME(S): Levaquin, Levaquin Leva-Pak What should I tell my health care provider before I take this medicine? They need to know if you have any of  these conditions: -cerebral  disease -irregular heartbeat -kidney disease -seizure disorder -an unusual or allergic reaction to levofloxacin, other antibiotics or medicines, foods, dyes, or preservatives -pregnant or trying to get pregnant -breast-feeding How should I use this medicine? Take this medicine by mouth with a full glass of water. Follow the directions on the prescription label. This medicine can be taken with or without food. Take your medicine at regular intervals. Do not take your medicine more often than directed. Do not skip doses or stop your medicine early even if you feel better. Do not stop taking except on your doctor's advice. A special MedGuide will be given to you by the pharmacist with each prescription and refill. Be sure to read this information carefully each time. Talk to your pediatrician regarding the use of this medicine in children. While this drug may be prescribed for children as young as 6 months for selected conditions, precautions do apply. Overdosage: If you think you have taken too much of this medicine contact a poison control center or emergency room at once. NOTE: This medicine is only for you. Do not share this medicine with others. What if I miss a dose? If you miss a dose, take it as soon as you remember. If it is almost time for your next dose, take only that dose. Do not take double or extra doses. What may interact with this medicine? Do not take this medicine with any of the following medications: -arsenic trioxide -chloroquine -droperidol -medicines for irregular heart rhythm like amiodarone, disopyramide, dofetilide, flecainide, quinidine, procainamide, sotalol -some medicines for depression or mental problems like phenothiazines, pimozide, and ziprasidone This medicine may also interact with the following medications: -amoxapine -antacids -birth control pills -cisapride -dairy products -didanosine (ddI) buffered tablets or  powder -haloperidol -multivitamins -NSAIDS, medicines for pain and inflammation, like ibuprofen or naproxen -retinoid products like tretinoin or isotretinoin -risperidone -some other antibiotics like clarithromycin or erythromycin -sucralfate -theophylline -warfarin This list may not describe all possible interactions. Give your health care provider a list of all the medicines, herbs, non-prescription drugs, or dietary supplements you use. Also tell them if you smoke, drink alcohol, or use illegal drugs. Some items may interact with your medicine. What should I watch for while using this medicine? Tell your doctor or health care professional if your symptoms do not improve or if they get worse. Drink several glasses of water a day and cut down on drinks that contain caffeine. You must not get dehydrated while taking this medicine. You may get drowsy or dizzy. Do not drive, use machinery, or do anything that needs mental alertness until you know how this medicine affects you. Do not sit or stand up quickly, especially if you are an older patient. This reduces the risk of dizzy or fainting spells. This medicine can make you more sensitive to the sun. Keep out of the sun. If you cannot avoid being in the sun, wear protective clothing and use a sunscreen. Do not use sun lamps or tanning beds/booths. Contact your doctor if you get a sunburn. If you are a diabetic monitor your blood glucose carefully. If you get an unusual reading stop taking this medicine and call your doctor right away. Do not treat diarrhea with over-the-counter products. Contact your doctor if you have diarrhea that lasts more than 2 days or if the diarrhea is severe and watery. Avoid antacids, calcium, iron, and zinc products for 2 hours before and 2 hours after taking a dose of this medicine.  What side effects may I notice from receiving this medicine? Side effects that you should report to your doctor or health care professional  as soon as possible: -allergic reactions like skin rash or hives, swelling of the face, lips, or tongue -changes in vision -confusion, nightmares or hallucinations -difficulty breathing -irregular heartbeat, chest pain -joint, muscle or tendon pain -pain or difficulty passing urine -persistent headache with or without blurred vision -redness, blistering, peeling or loosening of the skin, including inside the mouth -seizures -unusual pain, numbness, tingling, or weakness -vaginal irritation, discharge Side effects that usually do not require medical attention (report to your doctor or health care professional if they continue or are bothersome): -diarrhea -dry mouth -headache -stomach upset, nausea -trouble sleeping This list may not describe all possible side effects. Call your doctor for medical advice about side effects. You may report side effects to FDA at 1-800-FDA-1088. Where should I keep my medicine? Keep out of the reach of children. Store at room temperature between 15 and 30 degrees C (59 and 86 degrees F). Keep in a tightly closed container. Throw away any unused medicine after the expiration date. NOTE: This sheet is a summary. It may not cover all possible information. If you have questions about this medicine, talk to your doctor, pharmacist, or health care provider.  2015, [employer]vier/Gold Standard. (2012-11-17 07:45:07) Oxygen Use at Home Oxygen can be prescribed for home use. The prescription will show the flow rate. This is how much oxygen is to be used per minute. This will be listed in liters per minute (LPM or L/M). A liter is a metric measurement of volume. You will use oxygen therapy as directed. It can be used while exercising, sleeping, or at rest. You may need oxygen continuously. Your health care provider may order a blood oxygen test (arterial blood gas or pulse oximetry test) that will show what your oxygen level is. Your health care provider will use these  measurements to learn about your needs and follow your progress. Home oxygen therapy is commonly used on patients with various lung (pulmonary) related conditions. Some of these conditions include:  Asthma.  Lung cancer.  Pneumonia.  Emphysema.  Chronic bronchitis.  Cystic fibrosis.  Other lung diseases.  Pulmonary fibrosis.  Occupational lung disease.  Heart failure.  Chronic obstructive pulmonary disease (COPD). 3 COMMON WAYS OF PROVIDING OXYGEN THERAPY  Gas: The gas form of oxygen is put into variously sized cylinders or tanks. The cylinders or oxygen tanks contain compressed oxygen. The cylinder is equipped with a regulator that controls the flow rate. Because the flow of oxygen out of the cylinder is constant, an oxygen conserving device may be attached to the system to avoid waste. This device releases the gas only when you inhale and cuts it off when you exhale. Oxygen can be provided in a small cylinder that can be carried with you. Large tanks are heavy and are only for stationary use. After use, empty tanks must be exchanged for full tanks.  Liquid: The liquid form of oxygen is put into a container similar to a thermos. When released, the liquid converts to a gas and you breathe it in just like the compressed gas. This storage method takes up less space than the compressed gas cylinder, and you can transfer the liquid to a small, portable vessel at home. Liquid oxygen is more expensive than the compressed gas, and the vessel vents when not in use. An oxygen conserving device may be built into the vessel  to conserve the oxygen. Liquid oxygen is very cold, around 297 below zero.  Oxygen concentrator: This medical device filters oxygen from room air and gives almost 100% oxygen to the patient. Oxygen concentrators are powered by electricity. Benefits of this system are:  It does not need to be resupplied.  It is not as costly as liquid oxygen.  Extra tubing permits the  user to move around easier. There are several types of small, portable oxygen systems available which can help you remain active and mobile. You must have a cylinder of oxygen as a backup in the event of a power failure. Advise your electric power company that you are on oxygen therapy in order to get priority service when there is a power failure. OXYGEN DELIVERY DEVICES There are 3 common ways to deliver oxygen to your body.  Nasal cannula. This is a 2-pronged device inserted in the nostrils that is connected to tubing carrying the oxygen. The tubing can rest on the ears or be attached to the frame of eyeglasses.  Mask. People who need a high flow of oxygen generally use a mask.  Transtracheal catheter. Transtracheal oxygen therapy requires the insertion of a small, flexible tube (catheter) in the windpipe (trachea). This catheter is held in place by a necklace. Since transtracheal oxygen bypasses the mouth, nose, and throat, a humidifier is absolutely required at flow rates of 1 LPM or greater. OXYGEN USE SAFETY TIPS  Never smoke while using oxygen. Oxygen does not burn or explode, but flammable materials will burn faster in the presence of oxygen.  Keep a Data processing manager close by. Let your fire department know that you have oxygen in your home.  Warn visitors not to smoke near you when you are using oxygen. Put up "no smoking" signs in your home where you most often use the oxygen.  When you go to a restaurant with your portable oxygen source, ask to be seated in the nonsmoking section.  Stay at least 5 feet away from gas stoves, candles, lighted fireplaces, or other heat sources.  Do not use materials that burn easily (flammable) while using your oxygen.  If you use an oxygen cylinder, make sure it is secured to some fixed object or in a stand. If you use liquid oxygen, make sure the vessel is kept upright to keep the oxygen from pouring out. Liquid oxygen is so cold it can hurt your  skin.  If you use an oxygen concentrator, call your electric company so you will be given priority service if your power goes out. Avoid using extension cords, if possible.  Regularly test your smoke detectors at home to make sure they work. If you receive care in your home from a nurse or other health care provider, he or she may also check to make sure your smoke detectors work. GUIDELINES FOR CLEANING YOUR EQUIPMENT  Wash the nasal prongs with a liquid soap. Thoroughly rinse them once or twice a week.  Replace the prongs every 2 to 4 weeks. If you have an infection (cold, pneumonia) change them when you are well.  Your health care provider will give you instructions on how to clean your transtracheal catheter.  The humidifier bottle should be washed with soap and warm water and rinsed thoroughly between each refill. Air-dry the bottle before filling it with sterile or distilled water. The bottle and its top should be disinfected after they are cleaned.  If you use an oxygen concentrator, unplug the unit. Then wipe down  the cabinet with a damp cloth and dry it daily. The air filter should be cleaned at least twice a week.  Follow your home medical equipment and service company's directions for cleaning the compressor filter. HOME CARE INSTRUCTIONS   Do not change the flow of oxygen unless directed by your health care provider.  Do not use alcohol or other sedating drugs unless instructed. They slow your breathing rate.  Do not use materials that burn easily (flammable) while using your oxygen.  Always keep a spare tank of oxygen. Plan ahead for holidays when you may not be able to get a prescription filled.  Use water-based lubricants on your lips or nostrils. Do not use an oil-based product like petroleum jelly.  To prevent your cheeks or the skin behind your ears from becoming irritated, tuck some gauze under the tubing.  If you have persistent redness under your nose, call your  health care provider.  When you no longer need oxygen, your doctor will have the oxygen discontinued. Oxygen is not addicting or habit forming.  Use the oxygen as instructed. Too much oxygen can be harmful and too little will not give you the benefit you need.  Shortness of breath is not always from a lack of oxygen. If your oxygen level is not the cause of your shortness of breath, taking oxygen will not help. SEEK MEDICAL CARE IF:   You have frequent headaches.  You have shortness of breath or a lasting cough.  You have anxiety.  You are confused.  You are drowsy or sleepy all the time.  You develop an illness which aggravates your breathing.  You cannot exercise.  You are restless.  You have blue lips or fingernails.  You have difficult or irregular breathing and it is getting worse.  You have a fever. Document Released: 07/03/2003 Document Revised: 08/27/2013 Document Reviewed: 11/22/2012 [facility] Patient Information 2015 [place], [place]. This information is not intended to replace advice given to you by your health care provider. Make sure you discuss any questions you have with your health care provider. Aspirin and Your Heart Aspirin affects the way your blood clots and helps "thin" the blood. Aspirin has many uses in heart disease. It may be used as a primary prevention to help reduce the risk of heart related events. It also can be used as a secondary measure to prevent more heart attacks or to prevent additional damage from blood clots.  ASPIRIN MAY HELP IF YOU:  Have had a heart attack or chest pain.  Have undergone open heart surgery such as CABG (Coronary Artery Bypass Surgery).  Have had coronary angioplasty with or without stents.  Have experienced a stroke or TIA (transient ischemic attack).  Have peripheral vascular disease (PAD).  Have chronic heart rhythm problems such as atrial fibrillation.  Are at risk for heart disease. BEFORE STARTING  ASPIRIN Before you start taking aspirin, your caregiver will need to review your medical history. Many things will need to be taken into consideration, such as:  Smoking status.  Blood pressure.  Diabetes.  Gender.  Weight.  Cholesterol level. ASPIRIN DOSES  Aspirin should only be taken on the advice of your caregiver. Talk to your caregiver about how much aspirin you should take. Aspirin comes in different doses such as:  81 mg.  162 mg.  325 mg.  The aspirin dose you take may be affected by many factors, some of which include:  Your current medications, especially if your are taking blood-thinners or  anti-platelet medicine.  Liver function.  Heart disease risk.  Age.  Aspirin comes in two forms:  Non-enteric-coated. This type of aspirin does not have a coating and is absorbed faster. Non-enteric coated aspirin is recommended for patients experiencing chest pain symptoms. This type of aspirin also comes in a chewable form.  Enteric-coated. This means the aspirin has a special coating that releases the medicine very slowly. Enteric-coated aspirin causes less stomach upset. This type of aspirin should not be chewed or crushed. ASPIRIN SIDE EFFECTS Daily use of aspirin can increase your risk of serious side effects. Some of these include:  Increased bleeding. This can range from a cut that does not stop bleeding to more serious problems such as stomach bleeding or bleeding into the brain (Intracerebral bleeding).  Increased bruising.  Stomach upset.  An allergic reaction such as red, itchy skin.  Increased risk of bleeding when combined with non-steroidal anti-inflammatory medicine (NSAIDS).  Alcohol should be drank in moderation when taking aspirin. Alcohol can increase the risk of stomach bleeding when taken with aspirin.  Aspirin should not be given to children less than 57 years of age due to the association of Reye syndrome. Reye syndrome is a serious illness  that can affect the brain and liver. Studies have linked Reye syndrome with aspirin use in children.  People that have nasal polyps have an increased risk of developing an aspirin allergy. SEEK MEDICAL CARE IF:   You develop an allergic reaction such as:  Hives.  Itchy skin.  Swelling of the lips, tongue or face.  You develop stomach pain.  You have unusual bleeding or bruising.  You have ringing in your ears. SEEK IMMEDIATE MEDICAL CARE IF:   You have severe chest pain, especially if the pain is crushing or pressure-like and spreads to the arms, back, neck, or jaw. THIS IS AN EMERGENCY. Do not wait to see if the pain will go away. Get medical help at once. Call your local emergency services (911 in the [place] DO NOT drive yourself to the hospital.  You have stroke-like symptoms such as:  Loss of vision.  Difficulty talking.  Numbness or weakness on one side of your body.  Numbness or weakness in your arm or leg.  Not thinking clearly or feeling confused.  Your bowel movements are bloody, dark red or black in color.  You vomit or cough up blood.  You have blood in your urine.  You have shortness of breath, coughing or wheezing. MAKE SURE YOU:   Understand these instructions.  Will monitor your condition.  Seek immediate medical care if necessary. Document Released: 03/25/2008 Document Revised: 08/07/2012 Document Reviewed: 07/18/2013 [facility] Patient Information 2015 [place], [place]. This information is not intended to replace advice given to you by your health care provider. Make sure you discuss any questions you have with your health care provider.  Smoking Cessation Quitting smoking is important to your health and has many advantages. However, it is not always easy to quit since nicotine is a very addictive drug. Oftentimes, people try 3 times or more before being able to quit. This document explains the best ways for you to prepare to quit smoking. Quitting  takes hard work and a lot of effort, but you can do it. ADVANTAGES OF QUITTING SMOKING  You will live longer, feel better, and live better.  Your body will feel the impact of quitting smoking almost immediately.  Within 20 minutes, blood pressure decreases. Your pulse returns to its normal level.  After 8 hours, carbon monoxide levels in the blood return to normal. Your oxygen level increases.  After 24 hours, the chance of having a heart attack starts to decrease. Your breath, hair, and body stop smelling like smoke.  After 48 hours, damaged nerve endings begin to recover. Your sense of taste and smell improve.  After 72 hours, the body is virtually free of nicotine. Your bronchial tubes relax and breathing becomes easier.  After 2 to 12 weeks, lungs can hold more air. Exercise becomes easier and circulation improves.  The risk of having a heart attack, stroke, cancer, or lung disease is greatly reduced.  After 1 year, the risk of coronary heart disease is cut in half.  After 5 years, the risk of stroke falls to the same as a nonsmoker.  After 10 years, the risk of lung cancer is cut in half and the risk of other cancers decreases significantly.  After 15 years, the risk of coronary heart disease drops, usually to the level of a nonsmoker.  If you are pregnant, quitting smoking will improve your chances of having a healthy baby.  The people you live with, especially any children, will be healthier.  You will have extra money to spend on things other than cigarettes. QUESTIONS TO THINK ABOUT BEFORE ATTEMPTING TO QUIT You may want to talk about your answers with your health care provider.  Why do you want to quit?  If you tried to quit in the past, what helped and what did not?  What will be the most difficult situations for you after you quit? How will you plan to handle them?  Who can help you through the tough times? Your family? Friends? A health care provider?  What  pleasures do you get from smoking? What ways can you still get pleasure if you quit? Here are some questions to ask your health care provider:  How can you help me to be successful at quitting?  What medicine do you think would be best for me and how should I take it?  What should I do if I need more help?  What is smoking withdrawal like? How can I get information on withdrawal? GET READY  Set a quit date.  Change your environment by getting rid of all cigarettes, ashtrays, matches, and lighters in your home, car, or work. Do not let people smoke in your home.  Review your past attempts to quit. Think about what worked and what did not. GET SUPPORT AND ENCOURAGEMENT You have a better chance of being successful if you have help. You can get support in many ways.  Tell your family, friends, and coworkers that you are going to quit and need their support. Ask them not to smoke around you.  Get individual, group, or telephone counseling and support. Programs are available at [employer] and health centers. Call your local health department for information about programs in your area.  Spiritual beliefs and practices may help some smokers quit.  Download a "quit meter" on your computer to keep track of quit statistics, such as how long you have gone without smoking, cigarettes not smoked, and money saved.  Get a self-help book about quitting smoking and staying off tobacco. [place] yourself from urges to smoke. Talk to someone, go for a walk, or occupy your time with a task.  Change your normal routine. Take a different route to work. Drink tea instead of coffee. Eat breakfast in a  different place.  Reduce your stress. Take a hot bath, exercise, or read a book.  Plan something enjoyable to do every day. Reward yourself for not smoking.  Explore interactive web-based programs that specialize in helping you quit. GET MEDICINE AND USE IT  CORRECTLY Medicines can help you stop smoking and decrease the urge to smoke. Combining medicine with the above behavioral methods and support can greatly increase your chances of successfully quitting smoking.  Nicotine replacement therapy helps deliver nicotine to your body without the negative effects and risks of smoking. Nicotine replacement therapy includes nicotine gum, lozenges, inhalers, nasal sprays, and skin patches. Some may be available over-the-counter and others require a prescription.  Antidepressant medicine helps people abstain from smoking, but how this works is unknown. This medicine is available by prescription.  Nicotinic receptor partial agonist medicine simulates the effect of nicotine in your brain. This medicine is available by prescription. Ask your health care provider for advice about which medicines to use and how to use them based on your health history. Your health care provider will tell you what side effects to look out for if you choose to be on a medicine or therapy. Carefully read the information on the package. Do not use any other product containing nicotine while using a nicotine replacement product.  RELAPSE OR DIFFICULT SITUATIONS Most relapses occur within the first 3 months after quitting. Do not be discouraged if you start smoking again. Remember, most people try several times before finally quitting. You may have symptoms of withdrawal because your body is used to nicotine. You may crave cigarettes, be irritable, feel very hungry, cough often, get headaches, or have difficulty concentrating. The withdrawal symptoms are only temporary. They are strongest when you first quit, but they will go away within 10-14 days. To reduce the chances of relapse, try to:  Avoid drinking alcohol. Drinking lowers your chances of successfully quitting.  Reduce the amount of caffeine you consume. Once you quit smoking, the amount of caffeine in your body increases and can  give you symptoms, such as a rapid heartbeat, sweating, and anxiety.  Avoid smokers because they can make you want to smoke.  Do not let weight gain distract you. Many smokers will gain weight when they quit, usually less than 10 pounds. Eat a healthy diet and stay active. You can always lose the weight gained after you quit.  Find ways to improve your mood other than smoking. FOR MORE INFORMATION  www.smokefree.gov  Document Released: 04/06/2001 Document Revised: 08/27/2013 Document Reviewed: 07/22/2011 [facility] Patient Information 2015 [place], [place]. This information is not intended to replace advice given to you by your health care provider. Make sure you discuss any questions you have with your health care provider.

## 2014-04-16 ENCOUNTER — Telehealth: Payer: Self-pay | Admitting: Nurse Practitioner

## 2014-04-16 LAB — LEGIONELLA ANTIGEN, URINE

## 2014-04-16 LAB — CULTURE, RESPIRATORY W GRAM STAIN: Culture: NORMAL

## 2014-04-16 NOTE — Telephone Encounter (Signed)
Appointment scheduled for Wed 04/24/2014 with Ronnald Collum, FNP for a hospital follow up.

## 2014-04-20 LAB — CULTURE, BLOOD (ROUTINE X 2)
CULTURE: NO GROWTH
CULTURE: NO GROWTH

## 2014-04-24 ENCOUNTER — Ambulatory Visit (INDEPENDENT_AMBULATORY_CARE_PROVIDER_SITE_OTHER): Payer: Medicare HMO | Admitting: Nurse Practitioner

## 2014-04-24 ENCOUNTER — Encounter: Payer: Self-pay | Admitting: Nurse Practitioner

## 2014-04-24 VITALS — BP 135/74 | HR 67 | Temp 97.1°F | Ht 66.0 in | Wt 194.0 lb

## 2014-04-24 DIAGNOSIS — I214 Non-ST elevation (NSTEMI) myocardial infarction: Secondary | ICD-10-CM

## 2014-04-24 DIAGNOSIS — Z09 Encounter for follow-up examination after completed treatment for conditions other than malignant neoplasm: Secondary | ICD-10-CM

## 2014-04-24 DIAGNOSIS — J189 Pneumonia, unspecified organism: Secondary | ICD-10-CM

## 2014-04-24 MED ORDER — CARVEDILOL 3.125 MG PO TABS: ORAL_TABLET | ORAL | Status: DC

## 2014-04-24 NOTE — Progress Notes (Signed)
   Subjective:    Patient ID: Joseph Shields, male    DOB: [birth year], 68 y.o.   MRN: [id number]  HPI Patient in today for hospital follow up-Had bronchitis and pneumonia- was there for 2 days- on no antibiotic now- feeling much better. No fever- still has slight cough. His hospital record says that he had MI but he refused Cardiac cath. He was put on carvediol upon discharge and that has caused his ankles to swell so he will not take. They also started him back on lipitor for cholesterol and he does not want to take it.  *Dr. Radford Pax- cardiologist wants him to have cardiac cath- needs referral    Review of Systems  Constitutional: Negative.   HENT: Negative.   Respiratory: Positive for cough. Negative for shortness of breath.   Cardiovascular: Negative.   Gastrointestinal: Negative.   Genitourinary: Negative.   Neurological: Negative.   Psychiatric/Behavioral: Negative.   All other systems reviewed and are negative.      Objective:   Physical Exam  Constitutional: He is oriented to person, place, and time. He appears well-developed and well-nourished.  Cardiovascular: Normal rate, regular rhythm and normal heart sounds.   Pulmonary/Chest: Effort normal and breath sounds normal.  Neurological: He is alert and oriented to person, place, and time.  Skin: Skin is warm and dry.  Psychiatric: He has a normal mood and affect. His behavior is normal. Judgment and thought content normal.    BP 135/74 mmHg  Pulse 67  Temp(Src) 97.1 F (36.2 C) (Oral)  Ht 5\' 6"  (1.676 m)  Wt 194 lb (87.998 kg)  BMI 31.33 kg/m2        Assessment & Plan:  1. Hospital discharge follow-up Hospital records reviewed  2. Pneumonia, organism unspecified Cough an ddeep breathe every 2 hors mucinex OTC as needed  3. NSTEMI (non-ST elevated myocardial infarction) Decreased coreg to 1/2 bid to see if causes swelling - carvedilol (COREG) 3.125 MG tablet; 1/2 tablet BID  Dispense: 60 tablet; Refill: 0 -  Ambulatory referral to Cardiology  [place], FNP

## 2014-05-03 ENCOUNTER — Encounter: Payer: Self-pay | Admitting: Cardiology

## 2014-05-03 ENCOUNTER — Ambulatory Visit (INDEPENDENT_AMBULATORY_CARE_PROVIDER_SITE_OTHER): Payer: Commercial Managed Care - HMO | Admitting: Cardiology

## 2014-05-03 VITALS — BP 122/78 | HR 60 | Ht 66.0 in | Wt 195.8 lb

## 2014-05-03 DIAGNOSIS — R0602 Shortness of breath: Secondary | ICD-10-CM

## 2014-05-03 DIAGNOSIS — F17200 Nicotine dependence, unspecified, uncomplicated: Secondary | ICD-10-CM

## 2014-05-03 DIAGNOSIS — E785 Hyperlipidemia, unspecified: Secondary | ICD-10-CM | POA: Diagnosis not present

## 2014-05-03 DIAGNOSIS — I214 Non-ST elevation (NSTEMI) myocardial infarction: Secondary | ICD-10-CM | POA: Diagnosis not present

## 2014-05-03 DIAGNOSIS — R7989 Other specified abnormal findings of blood chemistry: Secondary | ICD-10-CM

## 2014-05-03 DIAGNOSIS — Z720 Tobacco use: Secondary | ICD-10-CM

## 2014-05-03 DIAGNOSIS — Z01812 Encounter for preprocedural laboratory examination: Secondary | ICD-10-CM

## 2014-05-03 DIAGNOSIS — R778 Other specified abnormalities of plasma proteins: Secondary | ICD-10-CM

## 2014-05-03 LAB — CBC WITH DIFFERENTIAL/PLATELET
Basophils Absolute: 0 10*3/uL (ref 0.0–0.1)
Basophils Relative: 0.3 % (ref 0.0–3.0)
EOS ABS: 0.3 10*3/uL (ref 0.0–0.7)
Eosinophils Relative: 5.5 % — ABNORMAL HIGH (ref 0.0–5.0)
HCT: 39.1 % (ref 39.0–52.0)
Hemoglobin: 12.8 g/dL — ABNORMAL LOW (ref 13.0–17.0)
LYMPHS PCT: 19.8 % (ref 12.0–46.0)
Lymphs Abs: 1.2 10*3/uL (ref 0.7–4.0)
MCHC: 32.8 g/dL (ref 30.0–36.0)
MCV: 93.4 fl (ref 78.0–100.0)
MONOS PCT: 11.6 % (ref 3.0–12.0)
Monocytes Absolute: 0.7 10*3/uL (ref 0.1–1.0)
Neutro Abs: 3.8 10*3/uL (ref 1.4–7.7)
Neutrophils Relative %: 62.8 % (ref 43.0–77.0)
PLATELETS: 264 10*3/uL (ref 150.0–400.0)
RBC: 4.18 Mil/uL — AB (ref 4.22–5.81)
RDW: 14.3 % (ref 11.5–15.5)
WBC: 6 10*3/uL (ref 4.0–10.5)

## 2014-05-03 LAB — PROTIME-INR
INR: 1 ratio (ref 0.8–1.0)
Prothrombin Time: 11.1 s (ref 9.6–13.1)

## 2014-05-03 LAB — BASIC METABOLIC PANEL
BUN: 12 mg/dL (ref 6–23)
CALCIUM: 9.1 mg/dL (ref 8.4–10.5)
CO2: 28 meq/L (ref 19–32)
CREATININE: 0.9 mg/dL (ref 0.4–1.5)
Chloride: 105 mEq/L (ref 96–112)
GFR: 89.32 mL/min (ref >60.00)
Glucose, Bld: 95 mg/dL (ref 70–99)
Potassium: 4.7 mEq/L (ref 3.5–5.1)
SODIUM: 138 meq/L (ref 135–145)

## 2014-05-03 MED ORDER — METOPROLOL SUCCINATE ER 25 MG PO TB24: 25.0000 mg | ORAL_TABLET | Freq: Every day | ORAL | Status: DC

## 2014-05-03 MED ORDER — ROSUVASTATIN CALCIUM 5 MG PO TABS: 5.0000 mg | ORAL_TABLET | Freq: Every day | ORAL | Status: DC

## 2014-05-03 NOTE — Progress Notes (Signed)
[place], [address], [place]  [address] Phone: [phone] Fax:  [phone]  Date:  05/03/2014   ID:  Joseph Shields, DOB 1945-08-04, MRN [id number]  PCP:  Chevis Pretty, FNP  Cardiologist:  Fransico Him, MD    History of Present Illness: Joseph Shields is a 69 y.o. male with no prior cardiac history who presents today for hospital followup.  He was initially admitted with cough and fever and SOB.  He was hypoxic on admission with WBC of 20K.  His chest xray was c/w bronchitis and he was treated with antibiotics.  He was also noted to have an elevated troponin at 6 and EKG showed nonspecific St abnormality.  Cardiology was consulted and echo showed normal LVF with no RWMA's.  Chest CT showed coronary artery calcifications.  Cardiac cath was recommended but patient refused.  He wanted to wait and be set up as an outpt.  He was subsequently discharged home and now presents for followup.  He was started on lipitor and stopped it due to muscle aches.  He also stopped the coreg because he said it made his legs swell.     Wt Readings from Last 3 Encounters:  05/03/14 195 lb 12.8 oz (88.814 kg)  04/24/14 194 lb (87.998 kg)  04/15/14 196 lb 6.9 oz (89.1 kg)     Past Medical History  Diagnosis Date  . Psoriasis     Current Outpatient Prescriptions  Medication Sig Dispense Refill  . aspirin 81 MG tablet Take 81 mg by mouth daily.    . cetirizine (ZYRTEC) 10 MG tablet Take 10 mg by mouth as needed.     . Cholecalciferol 1000 UNITS capsule Take 1,000 Units by mouth daily.    . Grape Seed 60 MG CAPS Take 60 mg by mouth at bedtime.    [name] guaiFENesin (MUCINEX) 600 MG 12 hr tablet Take 1 tablet (600 mg total) by mouth 2 (two) times daily. (Patient taking differently: Take 600 mg by mouth 2 (two) times daily as needed. ) 60 tablet 0  . atorvastatin (LIPITOR) 10 MG tablet Take 1 tablet (10 mg total) by mouth daily at 6 PM. (Patient not taking: Reported on 05/03/2014) 30 tablet 0  .  carvedilol (COREG) 3.125 MG tablet 1/2 tablet BID (Patient not taking: Reported on 05/03/2014) 60 tablet 0  . HYDROcodone-homatropine (HYCODAN) 5-1.5 MG/5ML syrup Take 5 mLs by mouth every 8 (eight) hours as needed for cough. (Patient not taking: Reported on 04/13/2014) 120 mL 0   No current facility-administered medications for this visit.    Allergies:    Allergies  Allergen Reactions  . Flonase [Fluticasone Propionate]     Nose bleeds.  . Penicillins     Social History:  The patient  reports that he quit smoking about 3 weeks ago. His smoking use included Cigarettes. He smoked 0.00 packs per day. He does not have any smokeless tobacco history on file. He reports that he does not drink alcohol or use illicit drugs.   Family History:  The patient's family history is not on file.   ROS:  Please see the history of present illness.      All other systems reviewed and negative.   PHYSICAL EXAM: VS:  BP 122/78 mmHg  Pulse 60  Ht 5\' 6"  (1.676 m)  Wt 195 lb 12.8 oz (88.814 kg)  BMI 31.62 kg/m2  SpO2 92% Well nourished, well developed, in no acute distress HEENT: normal Neck: no JVD  Cardiac:  normal S1, S2; RRR; no murmur Lungs:  clear to auscultation bilaterally, no wheezing, rhonchi or rales Abd: soft, nontender, no hepatomegaly Ext: no edema Skin: warm and dry Neuro:  CNs 2-12 intact, no focal abnormalities noted      ASSESSMENT AND PLAN:  1.  NSTEMI - recent in the setting of acute bronchitis with hypoxia.  Initially felt to be due to demand ischemia but his troponin went to 6 and Chest CT showed coronary artery calcifications.  Continue ASA.  He stopped Coreg so I will add Toprol XL 25mg  daily.  I have recommended that we proceed with cardiac cath to define coronary anatomy.  Cardiac catheterization was discussed with the patient fully including risks on myocardial infarction, death, stroke, bleeding, arrhythmia, dye allergy, renal insufficiency or bleeding.  All patient questions  and concerns were discussed and the patient understands and is willing to proceed.   2.   Tobacco abuse 3.   Dyslipidemia - he has not tolerated the lipitor due to muscle aches.  I will try Crestor 5mg  daily and recheck FLP and ALT in 6 weeks.    Followup with me after cath  Signed, Fransico Him, MD [facility] 05/03/2014 9:47 AM

## 2014-05-03 NOTE — Patient Instructions (Addendum)
Your physician has requested that you have a cardiac catheterization. Cardiac catheterization is used to diagnose and/or treat various heart conditions. Doctors may recommend this procedure for a number of different reasons. The most common reason is to evaluate chest pain. Chest pain can be a symptom of coronary artery disease (CAD), and cardiac catheterization can show whether plaque is narrowing or blocking your heart's arteries. This procedure is also used to evaluate the valves, as well as measure the blood flow and oxygen levels in different parts of your heart. For further information please visit HugeFiesta.tn. Please follow instruction sheet, as given.  Your physician has recommended you make the following change in your medication:  1) START CRESTOR 5 mg daily 2) START TOPROL 25 mg daily  Your physician recommends that you have lab work TODAY (PT, INR, BMET, CBC)  Your physician recommends that you schedule a follow-up appointment PENDING YOUR CATH.

## 2014-05-06 ENCOUNTER — Ambulatory Visit (HOSPITAL_COMMUNITY)
Admission: RE | Admit: 2014-05-06 | Discharge: 2014-05-06 | Disposition: A | Discharge status: Home / Self Care | Payer: Medicare HMO | Source: Ambulatory Visit | Attending: Cardiology | Admitting: Cardiology

## 2014-05-06 ENCOUNTER — Encounter (HOSPITAL_COMMUNITY): Payer: Self-pay | Admitting: Cardiology

## 2014-05-06 ENCOUNTER — Encounter (HOSPITAL_COMMUNITY)
Admission: RE | Disposition: A | Discharge status: Home / Self Care | Payer: Self-pay | Source: Ambulatory Visit | Attending: Cardiology

## 2014-05-06 DIAGNOSIS — I2584 Coronary atherosclerosis due to calcified coronary lesion: Secondary | ICD-10-CM | POA: Diagnosis not present

## 2014-05-06 DIAGNOSIS — L409 Psoriasis, unspecified: Secondary | ICD-10-CM | POA: Diagnosis not present

## 2014-05-06 DIAGNOSIS — Z880 Allergy status to penicillin: Secondary | ICD-10-CM | POA: Insufficient documentation

## 2014-05-06 DIAGNOSIS — E785 Hyperlipidemia, unspecified: Secondary | ICD-10-CM | POA: Diagnosis not present

## 2014-05-06 DIAGNOSIS — I2510 Atherosclerotic heart disease of native coronary artery without angina pectoris: Secondary | ICD-10-CM | POA: Insufficient documentation

## 2014-05-06 DIAGNOSIS — Z7982 Long term (current) use of aspirin: Secondary | ICD-10-CM | POA: Insufficient documentation

## 2014-05-06 DIAGNOSIS — Z87891 Personal history of nicotine dependence: Secondary | ICD-10-CM | POA: Diagnosis not present

## 2014-05-06 DIAGNOSIS — I214 Non-ST elevation (NSTEMI) myocardial infarction: Secondary | ICD-10-CM | POA: Insufficient documentation

## 2014-05-06 DIAGNOSIS — I2582 Chronic total occlusion of coronary artery: Secondary | ICD-10-CM | POA: Insufficient documentation

## 2014-05-06 DIAGNOSIS — Z888 Allergy status to other drugs, medicaments and biological substances status: Secondary | ICD-10-CM | POA: Diagnosis not present

## 2014-05-06 DIAGNOSIS — R0600 Dyspnea, unspecified: Secondary | ICD-10-CM | POA: Diagnosis present

## 2014-05-06 HISTORY — PX: LEFT HEART CATHETERIZATION WITH CORONARY ANGIOGRAM: SHX5451

## 2014-05-06 SURGERY — LEFT HEART CATHETERIZATION WITH CORONARY ANGIOGRAM
Anesthesia: LOCAL

## 2014-05-06 MED ORDER — ASPIRIN 81 MG PO CHEW: 81.0000 mg | CHEWABLE_TABLET | ORAL | Status: DC

## 2014-05-06 MED ORDER — LIDOCAINE HCL (PF) 1 % IJ SOLN
INTRAMUSCULAR | Status: AC
  Filled 2014-05-06: qty 30

## 2014-05-06 MED ORDER — SODIUM CHLORIDE 0.9 % IV SOLN: 1.0000 mL/kg/h | INTRAVENOUS | Status: DC

## 2014-05-06 MED ORDER — ACETAMINOPHEN 325 MG PO TABS: 650.0000 mg | ORAL_TABLET | ORAL | Status: DC | PRN

## 2014-05-06 MED ORDER — HEPARIN SODIUM (PORCINE) 1000 UNIT/ML IJ SOLN
INTRAMUSCULAR | Status: AC
  Filled 2014-05-06: qty 1

## 2014-05-06 MED ORDER — ONDANSETRON HCL 4 MG/2ML IJ SOLN: 4.0000 mg | Freq: Four times a day (QID) | INTRAMUSCULAR | Status: DC | PRN

## 2014-05-06 MED ORDER — MIDAZOLAM HCL 2 MG/2ML IJ SOLN
INTRAMUSCULAR | Status: AC
Start: 2014-05-06 — End: 2014-05-06
  Filled 2014-05-06: qty 2

## 2014-05-06 MED ORDER — NITROGLYCERIN 1 MG/10 ML FOR IR/CATH LAB
INTRA_ARTERIAL | Status: AC
  Filled 2014-05-06: qty 10

## 2014-05-06 MED ORDER — VERAPAMIL HCL 2.5 MG/ML IV SOLN
INTRAVENOUS | Status: AC
  Filled 2014-05-06: qty 2

## 2014-05-06 MED ORDER — HEPARIN (PORCINE) IN NACL 2-0.9 UNIT/ML-% IJ SOLN
INTRAMUSCULAR | Status: AC
  Filled 2014-05-06: qty 1000

## 2014-05-06 MED ORDER — FENTANYL CITRATE 0.05 MG/ML IJ SOLN
INTRAMUSCULAR | Status: AC
  Filled 2014-05-06: qty 2

## 2014-05-06 MED ORDER — SODIUM CHLORIDE 0.9 % IJ SOLN: 3.0000 mL | INTRAMUSCULAR | Status: DC | PRN

## 2014-05-06 MED ORDER — SODIUM CHLORIDE 0.9 % IJ SOLN: 3.0000 mL | Freq: Two times a day (BID) | INTRAMUSCULAR | Status: DC

## 2014-05-06 MED ORDER — SODIUM CHLORIDE 0.9 % IV SOLN: 250.0000 mL | INTRAVENOUS | Status: DC | PRN

## 2014-05-06 NOTE — Discharge Instructions (Signed)
Radial Site Care °Refer to this sheet in the next few weeks. These instructions provide you with information on caring for yourself after your procedure. Your caregiver may also give you more specific instructions. Your treatment has been planned according to current medical practices, but problems sometimes occur. Call your caregiver if you have any problems or questions after your procedure. °HOME CARE INSTRUCTIONS °· You may shower the day after the procedure. Remove the bandage (dressing) and gently wash the site with plain soap and water. Gently pat the site dry. °· Do not apply powder or lotion to the site. °· Do not submerge the affected site in water for 3 to 5 days. °· Inspect the site at least twice daily. °· Do not flex or bend the affected arm for 24 hours. °· No lifting over 5 pounds (2.3 kg) for 5 days after your procedure. °· Do not drive home if you are discharged the same day of the procedure. Have someone else drive you. °· You may drive 24 hours after the procedure unless otherwise instructed by your caregiver. °· Do not operate machinery or power tools for 24 hours. °· A responsible adult should be with you for the first 24 hours after you arrive home. °What to expect: °· Any bruising will usually fade within 1 to 2 weeks. °· Blood that collects in the tissue (hematoma) may be painful to the touch. It should usually decrease in size and tenderness within 1 to 2 weeks. °SEEK IMMEDIATE MEDICAL CARE IF: °· You have unusual pain at the radial site. °· You have redness, warmth, swelling, or pain at the radial site. °· You have drainage (other than a small amount of blood on the dressing). °· You have chills. °· You have a fever or persistent symptoms for more than 72 hours. °· You have a fever and your symptoms suddenly get worse. °· Your arm becomes pale, cool, tingly, or numb. °· You have heavy bleeding from the site. Hold pressure on the site. °Document Released: 05/15/2010 Document Revised:  07/05/2011 Document Reviewed: 05/15/2010 °Exit[employer]® Patient Information ©2015 [employer]. This information is not intended to replace advice given to you by your health care provider. Make sure you discuss any questions you have with your health care provider. ° °

## 2014-05-06 NOTE — H&P (View-Only) (Signed)
[place], [address] [place], [place]  [address] Phone: [phone] Fax:  [phone]  Date:  05/03/2014   ID:  Dewel Lotter Ege, DOB 1946-03-31, MRN [id number]  PCP:  Chevis Pretty, FNP  Cardiologist:  Fransico Him, MD    History of Present Illness: Joseph Shields is a 69 y.o. male with no prior cardiac history who presents today for hospital followup.  He was initially admitted with cough and fever and SOB.  He was hypoxic on admission with WBC of 20K.  His chest xray was c/w bronchitis and he was treated with antibiotics.  He was also noted to have an elevated troponin at 6 and EKG showed nonspecific St abnormality.  Cardiology was consulted and echo showed normal LVF with no RWMA's.  Chest CT showed coronary artery calcifications.  Cardiac cath was recommended but patient refused.  He wanted to wait and be set up as an outpt.  He was subsequently discharged home and now presents for followup.  He was started on lipitor and stopped it due to muscle aches.  He also stopped the coreg because he said it made his legs swell.     Wt Readings from Last 3 Encounters:  05/03/14 195 lb 12.8 oz (88.814 kg)  04/24/14 194 lb (87.998 kg)  04/15/14 196 lb 6.9 oz (89.1 kg)     Past Medical History  Diagnosis Date  . Psoriasis     Current Outpatient Prescriptions  Medication Sig Dispense Refill  . aspirin 81 MG tablet Take 81 mg by mouth daily.    . cetirizine (ZYRTEC) 10 MG tablet Take 10 mg by mouth as needed.     . Cholecalciferol 1000 UNITS capsule Take 1,000 Units by mouth daily.    . Grape Seed 60 MG CAPS Take 60 mg by mouth at bedtime.    [name] guaiFENesin (MUCINEX) 600 MG 12 hr tablet Take 1 tablet (600 mg total) by mouth 2 (two) times daily. (Patient taking differently: Take 600 mg by mouth 2 (two) times daily as needed. ) 60 tablet 0  . atorvastatin (LIPITOR) 10 MG tablet Take 1 tablet (10 mg total) by mouth daily at 6 PM. (Patient not taking: Reported on 05/03/2014) 30 tablet 0  .  carvedilol (COREG) 3.125 MG tablet 1/2 tablet BID (Patient not taking: Reported on 05/03/2014) 60 tablet 0  . HYDROcodone-homatropine (HYCODAN) 5-1.5 MG/5ML syrup Take 5 mLs by mouth every 8 (eight) hours as needed for cough. (Patient not taking: Reported on 04/13/2014) 120 mL 0   No current facility-administered medications for this visit.    Allergies:    Allergies  Allergen Reactions  . Flonase [Fluticasone Propionate]     Nose bleeds.  . Penicillins     Social History:  The patient  reports that he quit smoking about 3 weeks ago. His smoking use included Cigarettes. He smoked 0.00 packs per day. He does not have any smokeless tobacco history on file. He reports that he does not drink alcohol or use illicit drugs.   Family History:  The patient's family history is not on file.   ROS:  Please see the history of present illness.      All other systems reviewed and negative.   PHYSICAL EXAM: VS:  BP 122/78 mmHg  Pulse 60  Ht 5\' 6"  (1.676 m)  Wt 195 lb 12.8 oz (88.814 kg)  BMI 31.62 kg/m2  SpO2 92% Well nourished, well developed, in no acute distress HEENT: normal Neck: no JVD  Cardiac:  normal S1, S2; RRR; no murmur Lungs:  clear to auscultation bilaterally, no wheezing, rhonchi or rales Abd: soft, nontender, no hepatomegaly Ext: no edema Skin: warm and dry Neuro:  CNs 2-12 intact, no focal abnormalities noted      ASSESSMENT AND PLAN:  1.  NSTEMI - recent in the setting of acute bronchitis with hypoxia.  Initially felt to be due to demand ischemia but his troponin went to 6 and Chest CT showed coronary artery calcifications.  Continue ASA.  He stopped Coreg so I will add Toprol XL 25mg  daily.  I have recommended that we proceed with cardiac cath to define coronary anatomy.  Cardiac catheterization was discussed with the patient fully including risks on myocardial infarction, death, stroke, bleeding, arrhythmia, dye allergy, renal insufficiency or bleeding.  All patient questions  and concerns were discussed and the patient understands and is willing to proceed.   2.   Tobacco abuse 3.   Dyslipidemia - he has not tolerated the lipitor due to muscle aches.  I will try Crestor 5mg  daily and recheck FLP and ALT in 6 weeks.    Followup with me after cath  Signed, Fransico Him, MD [facility] 05/03/2014 9:47 AM

## 2014-05-06 NOTE — Interval H&P Note (Signed)
History and Physical Interval Note:  05/06/2014 12:01 PM  Joseph Shields  has presented today for surgery, with the diagnosis of recent NSTEMI with exertional dyspnea.   The various methods of treatment have been discussed with the patient and family. After consideration of risks, benefits and other options for treatment, the patient has consented to  Procedure(s): LEFT HEART CATHETERIZATION WITH CORONARY ANGIOGRAM (N/A) +/- PCI as a surgical intervention .  The patient's history has been reviewed, patient examined, no change in status, stable for surgery.  I have reviewed the patient's chart and labs.  Questions were answered to the patient's satisfaction.    Cath Lab Visit (complete for each Cath Lab visit)  Clinical Evaluation Leading to the Procedure:   ACS: Yes.    Non-ACS:    Anginal Classification: CCS II - dyspnea on exertion  Anti-ischemic medical therapy: Minimal Therapy (1 class of medications)  Non-Invasive Test Results: No non-invasive testing performed  Prior CABG: No previous CABG    HARDING, DAVID W  AUC FOR CATH: CAD Assessment (Coronary Angiography With or Without Left Heart Catheterization and/or Left Ventriculography)  Patient Information:  UA/NSTEMI, Intermediate Risk  AUC Score:   A (8)   Indication:   3  AUC FOR REVASCULARIZATION TIMI Score  Patient Information:  TIMI Score is 3   UA/NSTEMI and intermediate-risk features (e.g., TIMI score 3-4) for short-term risk of death or nonfatal MI  Revascularization of the presumed culprit artery   A (9)  Indication: 10; Score: 9   HARDING, Leonie Green, M.D., M.S. Interventional Cardiologist   Pager # [phone]

## 2014-05-06 NOTE — CV Procedure (Signed)
CARDIAC CATHETERIZATION REPORT  NAME:  Joseph Shields   MRN: [id number] DOB:  Jul 10, 1945   ADMIT DATE: 05/06/2014 Procedure Date: 05/06/2014  INTERVENTIONAL CARDIOLOGIST: Leonie Man, M.D., MS PRIMARY CARE PROVIDER: Chevis Pretty, FNP PRIMARY CARDIOLOGIST: Fransico Him, M.D.  PATIENT:  Joseph Shields is a 69 y.o. male with a history of dyslipidemia for smoker who was initially admitted to [facility] on December 19-21 2015 with acute respiratory failure with hypoxia due to acute bronchitis with failed outpatient therapy. He also was noted at that time to have positive cardiac enzymes suggestive of non-ST elevation MI. At that time he was reluctant to undergo cardiac catheterization, asked to recover from his respiratory illness prior to undergoing catheterization.  He was seen by Dr. Radford Pax on January 8, and they discussed his feelings about cardiac catheterization and he was acceptable to proceed now as he is feeling better.  He is now referred for invasive evaluation of his recent non-STEMI.  PRE-OPERATIVE DIAGNOSIS:    Recent non-STEMI  Exertional Dyspnea  PROCEDURES PERFORMED:    Left Heart Catheterization with Native Coronary Angiography  via RIGHT RADIAL Artery   Left Ventriculography  PROCEDURE: The patient was brought to the 2nd [facility] in the fasting state and prepped and draped in the usual sterile fashion for RIGHT RADIAL artery access. A modified Allen's test was performed on the right wrist demonstrating excellent collateral flow for radial access.   Sterile technique was used including antiseptics, cap, gloves, gown, hand hygiene, mask and sheet. Skin prep: Chlorhexidine.   Consent: Risks of procedure as well as the alternatives and risks of each were explained to the (patient/caregiver). Consent for procedure obtained.   Time Out: Verified patient identification, verified procedure, site/side was marked, verified  correct patient position, special equipment/implants available, medications/allergies/relevent history reviewed, required imaging and test results available. Performed.   Access: RIGHT RADIAL Artery: 6 Fr Sheath -  Seldinger Technique (Angiocath Micropuncture Kit)  Radial Cocktail - 10 mL; IV Heparin 4500  Units   Left Heart Catheterization: 5Fr advanced and exchanged over a long exchange safety - under fluoroscopy; TIG 4.0  catheter advanced first.  Left Coronary Artery Cineangiography: TIG 4.0ight Coronary Artery Cineangiography: TIG 4.0 Catheter   LV Hemodynamics (LV Gram): Angled pigtail  Sheath removed in Cardiac catheterization lab with TR band placement for hemostasis.  TR Band 1245 Hours; 12 mL air  FINDINGS:  Hemodynamics:   Central Aortic Pressure / Mean: 108/58/77  Left Ventricular Pressure / LVEDP:109/1/15g  Left Ventriculography:  EF: 55-60 %  Wall Motion: Normal  Coronary Anatomy:  Dominance: Right  Left Main:  normal caliber vessel that bifurcates into the LAD and circumflex. There is relatively long segment prior to the bifurcation. Moderate calcification noted, with no significant stenoses. LAD: Normal caliber vessel that reaches down around the apex perfusing the inferoapex. There are 2 diagonal branches there is diffuse calcification of the proximal segment with a roughly 20-30% stenoses. Most notable 40% at the takeoff of a major D2 branch.  D1: Small-moderate caliber vessel, tortuous with diffuse 30-40% stenosis.  D2:  Small-moderate caliber vessel with proximal 60-70% stenosis (roughly 1.5 to 2.0 mm vessel)  Left Circumflex: Normal caliber vessel with a sharp angle takeoff from the left main. There is a small first marginal branch followed by a moderate caliber maintained OM1. The AV groove the vessel gives off a small AV groove circumflex which terminates as a small posterior lateral before terminating as  a large inferolateral OM 2 there is tortuous and  reaches down to the inferolateral apex.  The AV groove circumflex provides collaterals to the right posterolateral system.   RCA:  Large-caliber, dominant vessel with a bird's beak occlusion in the early proximal segment. There is faint in-line flow seen with prolonged injection to the distal vessel.   There is brisk collaterals from septal perforators to the PDA filling the entire PDA back to the posterolateral branch and all with native RCA almost to the proximal occlusion. Are actually atrial branches from the circumflex that provides collaterals to the RCA atrial branch back to an RV marginal.   After reviewing the initial angiography, the culprit lesion was thought to bethe 100% occluded RCA that probably is chronic. Likely etiology for positive troponins was hypoxia in the setting of existing disease suggestive of demand ischemia.   MEDICATIONS:  Anesthesia:  Local Lidocaine 2 mL  Sedation:  1  mg IV Versed, We 5  mcg IV fentanyl ;   Omnipaque Contrast: 75 ml  Anticoagulation:  IV Heparin 4500 Units ; Angiomax Bolus & drip Radial Cocktail: 5 mg Verapamil, 400 mcg NTG, 2 ml 2% Lidocaine in 10 ml NS  PATIENT DISPOSITION:    The patient was transferred to the PACU holding area in a hemodynamicaly stable, chest pain free condition.  The patient tolerated the procedure well, and there were no complications.  EBL:   < 10 ml  The patient was stable before, during, and after the procedure.  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  Moderate disease in a small to moderate caliber diagonal branch.  Well-preserved LVEF with mild elevated LVEDP  PLAN OF CARE:  Standard post radial cath care in short stay. Despite discharge this afternoon  Optimize medical therapy for chronic total occlusion of the RCA.   If symptoms persist on optimize medical therapy, could consider CTO PCI of  RCA.  Note forted to PCP and Dr. Leda Quail, Remona Boom Viona Gilmore, M.D., M.S. Interventional Cardiologist   Pager # [phone]

## 2014-05-08 ENCOUNTER — Telehealth: Payer: Self-pay | Admitting: Cardiology

## 2014-05-08 NOTE — Telephone Encounter (Signed)
New Msg         Pt calling concerned about being contacted since he's had a procedure completed on Monday.   Pt states he is feeling fine but was informed that he would be called.

## 2014-05-08 NOTE — Telephone Encounter (Signed)
Informed patient he would be contacted tomorrow.

## 2014-05-09 ENCOUNTER — Encounter: Payer: Self-pay | Admitting: Family

## 2014-05-09 ENCOUNTER — Ambulatory Visit (INDEPENDENT_AMBULATORY_CARE_PROVIDER_SITE_OTHER): Payer: Medicare HMO | Admitting: Family

## 2014-05-09 DIAGNOSIS — J209 Acute bronchitis, unspecified: Secondary | ICD-10-CM | POA: Diagnosis not present

## 2014-05-09 MED ORDER — LEVOFLOXACIN 500 MG PO TABS: 500.0000 mg | ORAL_TABLET | Freq: Every day | ORAL | Status: DC

## 2014-05-09 MED ORDER — BENZONATATE 200 MG PO CAPS: 200.0000 mg | ORAL_CAPSULE | Freq: Three times a day (TID) | ORAL | Status: DC | PRN

## 2014-05-09 MED ORDER — IPRATROPIUM-ALBUTEROL 0.5-2.5 (3) MG/3ML IN SOLN
3.0000 mL | Freq: Once | RESPIRATORY_TRACT | Status: AC
  Administered 2014-05-09: 3 mL via RESPIRATORY_TRACT

## 2014-05-09 MED ORDER — ALBUTEROL SULFATE HFA 108 (90 BASE) MCG/ACT IN AERS: 2.0000 | INHALATION_SPRAY | Freq: Four times a day (QID) | RESPIRATORY_TRACT | Status: DC | PRN

## 2014-05-09 MED ORDER — IPRATROPIUM-ALBUTEROL 0.5-2.5 (3) MG/3ML IN SOLN: 3.0000 mL | RESPIRATORY_TRACT | Status: DC

## 2014-05-09 NOTE — Patient Instructions (Signed)

## 2014-05-09 NOTE — Telephone Encounter (Signed)
Has an appointment to see Angelica Ran PA Friday 05/10/2014.

## 2014-05-09 NOTE — Progress Notes (Signed)
Subjective:    Patient ID: Joseph Shields, male    DOB: June 22, 1945, 69 y.o.   MRN: [id number]  Cough This is a recurrent problem. The current episode started 1 to 4 weeks ago. The problem has been waxing and waning. The problem occurs every few minutes. The cough is non-productive. Associated symptoms include nasal congestion, postnasal drip, rhinorrhea, shortness of breath and wheezing. Pertinent negatives include no ear congestion, ear pain, fever, headaches, hemoptysis or sore throat. The symptoms are aggravated by exercise and lying down. He has tried rest (Mucinex) for the symptoms. The treatment provided mild relief.      Review of Systems  Constitutional: Negative.  Negative for fever.  HENT: Positive for postnasal drip and rhinorrhea. Negative for ear pain and sore throat.   Respiratory: Positive for cough, shortness of breath and wheezing. Negative for hemoptysis.   Cardiovascular: Negative.   Gastrointestinal: Negative.   Endocrine: Negative.   Genitourinary: Negative.   Musculoskeletal: Negative.   Neurological: Negative.  Negative for headaches.  Hematological: Negative.   Psychiatric/Behavioral: Negative.   All other systems reviewed and are negative.      Objective:   Physical Exam  Constitutional: He is oriented to person, place, and time. He appears well-developed and well-nourished. No distress.  HENT:  Head: Normocephalic.  Right Ear: External ear normal.  Left Ear: External ear normal.  Nasal passage erythemas with mild swelling. Small amt of blood  Oropharynx erythemas     Eyes: Pupils are equal, round, and reactive to light. Right eye exhibits no discharge. Left eye exhibits no discharge.  Neck: Normal range of motion. Neck supple. No thyromegaly present.  Cardiovascular: Normal rate, regular rhythm, normal heart sounds and intact distal pulses.   No murmur heard. Pulmonary/Chest: Effort normal. No respiratory distress. He has wheezes.  Lungs sound  tight   Abdominal: Soft. Bowel sounds are normal. He exhibits no distension. There is no tenderness.  Musculoskeletal: Normal range of motion. He exhibits no edema or tenderness.  Neurological: He is alert and oriented to person, place, and time. He has normal reflexes. No cranial nerve deficit.  Skin: Skin is warm and dry. No rash noted. No erythema.  Psychiatric: He has a normal mood and affect. His behavior is normal. Judgment and thought content normal.  Vitals reviewed.     BP 123/77 mmHg  Pulse 61  Temp(Src) 97.5 F (36.4 C) (Oral)  Ht 5\' 7"  (1.702 m)  Wt 192 lb (87.091 kg)  BMI 30.06 kg/m2     Assessment & Plan:  1. Acute bronchitis, unspecified organism - Take meds as prescribed - Use a cool mist humidifier  -Use saline nose sprays frequently -Saline irrigations of the nose can be very helpful if done frequently.  * 4X daily for 1 week*  * Use of a nettie pot can be helpful with this. Follow directions with this* -Force fluids -For any cough or congestion  Use plain Mucinex- regular strength or max strength is fine   * Children- consult with Pharmacist for dosing -For fever or aces or pains- take tylenol or ibuprofen appropriate for age and weight.  * for fevers greater than 101 orally you may alternate ibuprofen and tylenol every  3 hours - levofloxacin (LEVAQUIN) 500 MG tablet; Take 1 tablet (500 mg total) by mouth daily.  Dispense: 7 tablet; Refill: 0 - albuterol (PROVENTIL HFA;VENTOLIN HFA) 108 (90 BASE) MCG/ACT inhaler; Inhale 2 puffs into the lungs every 6 (six) hours as needed for wheezing  or shortness of breath.  Dispense: 1 Inhaler; Refill: 2 - benzonatate (TESSALON) 200 MG capsule; Take 1 capsule (200 mg total) by mouth 3 (three) times daily as needed.  Dispense: 30 capsule; Refill: [place], FNP

## 2014-05-10 ENCOUNTER — Ambulatory Visit (INDEPENDENT_AMBULATORY_CARE_PROVIDER_SITE_OTHER): Payer: Commercial Managed Care - HMO | Admitting: Nurse Practitioner

## 2014-05-10 ENCOUNTER — Encounter: Payer: Self-pay | Admitting: Nurse Practitioner

## 2014-05-10 VITALS — BP 122/66 | HR 66 | Ht 67.0 in | Wt 192.8 lb

## 2014-05-10 DIAGNOSIS — I2510 Atherosclerotic heart disease of native coronary artery without angina pectoris: Secondary | ICD-10-CM | POA: Diagnosis not present

## 2014-05-10 DIAGNOSIS — Z87891 Personal history of nicotine dependence: Secondary | ICD-10-CM

## 2014-05-10 DIAGNOSIS — I25118 Atherosclerotic heart disease of native coronary artery with other forms of angina pectoris: Secondary | ICD-10-CM | POA: Insufficient documentation

## 2014-05-10 DIAGNOSIS — E785 Hyperlipidemia, unspecified: Secondary | ICD-10-CM | POA: Diagnosis not present

## 2014-05-10 DIAGNOSIS — J449 Chronic obstructive pulmonary disease, unspecified: Secondary | ICD-10-CM | POA: Diagnosis not present

## 2014-05-10 DIAGNOSIS — E782 Mixed hyperlipidemia: Secondary | ICD-10-CM | POA: Insufficient documentation

## 2014-05-10 DIAGNOSIS — J441 Chronic obstructive pulmonary disease with (acute) exacerbation: Secondary | ICD-10-CM | POA: Insufficient documentation

## 2014-05-10 NOTE — Patient Instructions (Signed)
Your physician wants you to follow-up in: 3 months with Dr. Radford Pax. You will receive a reminder letter in the mail two months in advance. If you don't receive a letter, please call our office to schedule the follow-up appointment.  You have been referred to :Pulmonary for evaluation of COPD.  Your physician recommends that you continue on your current medications as directed. Please refer to the Current Medication list given to you today.

## 2014-05-10 NOTE — Progress Notes (Signed)
Patient Name: Joseph Shields Date of Encounter: 05/10/2014  Primary Care Provider:  Chevis Pretty, [place] Primary Cardiologist:  T. Turner, MD   Chief Complaint  69 year old male status post recent type II non-ST elevation MI and subsequent catheterization revealing chronic total occlusion of the right coronary artery, who presents for follow-up today.  Past Medical History   Past Medical History  Diagnosis Date  . CAD (coronary artery disease)     a. 03/2014 NSTEMI - initially refused cath;  b. 03/2014 Echo: EF 55-60%;  c. 04/2014 Cath: LM nl, LAD 20-30p, 14m, D1 30-40, D2 small, 60-70, LCX nl, RCA 100p (CTO), L->R collaterals, EF 55-60%-->Med Rx.  [name] COPD (chronic obstructive pulmonary disease)     a. On home O2.  [name] History of tobacco abuse     a. 40+ pack years, quit 03/2014.  [name] Hyperlipidemia   . Morbid obesity   . Psoriasis    Past Surgical History  Procedure Laterality Date  . Spine surgery    . Left heart catheterization with coronary angiogram N/A 05/06/2014    Procedure: LEFT HEART CATHETERIZATION WITH CORONARY ANGIOGRAM;  Surgeon: Leonie Man, MD;  Location: [facility] CATH LAB;  Service: Cardiovascular;  Laterality: N/A;    Allergies  Allergies  Allergen Reactions  . Flonase [Fluticasone Propionate] Other (See Comments)    Nose bleeds.  . Penicillins Other (See Comments)    REACTION: unknown    HPI  69 year old male with the above complex problem list. He was admitted to [facility] in December 2015 with acute respiratory failure in the setting of COPD exacerbation and pneumonia. He was treated with antibiotics and was found to have an elevated troponin up to a peak nearly 7. Echocardiogram showed normal LV function. CT of the chest showed coronary calcifications and augmentation was made for diagnostic catheterization. Patient refused and wished to reconsider following full recovery as an outpatient. He was seen last week by Dr. Radford Pax and was doing well. He was  arranged for diagnostic catheterization which took place this past Monday, January 11, revealing a chronic total occlusion of the right coronary artery with left-to-right collaterals and otherwise nonobstructive disease and normal LV function. Continue medical therapy was recommended. Since his catheterization, he hasn't had any chest discomfort. He does have dyspnea on exertion and has been using his oxygen at home when he expresses dyspnea. He does not use it 24 hours a day however. He denies PND, orthopnea, dizziness, syncope, edema, or early satiety. His right wrist is healing well.  Home Medications  Prior to Admission medications   Medication Sig Start Date End Date Taking? Authorizing Provider  albuterol (PROVENTIL HFA;VENTOLIN HFA) 108 (90 BASE) MCG/ACT inhaler Inhale 2 puffs into the lungs every 6 (six) hours as needed for wheezing or shortness of breath. 05/09/14  Yes Sharion Balloon, FNP  aspirin EC 81 MG tablet Take 81 mg by mouth daily.   Yes Historical Provider, MD  benzonatate (TESSALON) 200 MG capsule Take 1 capsule (200 mg total) by mouth 3 (three) times daily as needed. Patient taking differently: Take 200 mg by mouth 3 (three) times daily as needed for cough.  05/09/14  Yes Sharion Balloon, FNP  cetirizine (ZYRTEC) 10 MG tablet Take 10 mg by mouth as needed for allergies.    Yes Historical Provider, MD  Cholecalciferol 1000 UNITS capsule Take 1,000 Units by mouth daily.   Yes Historical Provider, MD  folic acid (FOLVITE) 1 MG tablet Take 1 mg by  mouth daily.   Yes Historical Provider, MD  Grape Seed 60 MG CAPS Take 60 mg by mouth at bedtime.   Yes Historical Provider, MD  guaiFENesin (MUCINEX) 600 MG 12 hr tablet Take 1 tablet (600 mg total) by mouth 2 (two) times daily. Patient taking differently: Take 600 mg by mouth 2 (two) times daily as needed for cough.  04/15/14  Yes Samella Parr, NP  levofloxacin (LEVAQUIN) 500 MG tablet Take 1 tablet (500 mg total) by mouth daily. 05/09/14   Yes Sharion Balloon, FNP  metoprolol succinate (TOPROL XL) 25 MG 24 hr tablet Take 1 tablet (25 mg total) by mouth daily. 05/03/14  Yes [name], MD  rosuvastatin (CRESTOR) 5 MG tablet Take 1 tablet (5 mg total) by mouth daily. 05/03/14  Yes [name], MD    Review of Systems  Dyspnea on exertion for which she is wearing O2.  All other systems reviewed and are otherwise negative except as noted above.  Physical Exam  Blood pressure 122/66, pulse 66, height 5\' 7"  (1.702 m), weight 192 lb 12.8 oz (87.454 kg).  General: Pleasant, NAD Psych: Normal affect. Neuro: Alert and oriented X 3. Moves all extremities spontaneously. HEENT: Normal  Neck: Supple without bruits or JVD. Lungs:  Resp regular and unlabored, diminished breath sounds bilaterally with scattered rhonchi and diffuse inspiratory and expiratory wheezing. Heart: RRR no s3, s4, or murmurs. Abdomen: Soft, non-tender, non-distended, BS + x 4.  Extremities: No clubbing, cyanosis or edema. DP/PT/Radials 2+ and equal bilaterally. Right wrist without bleeding, bruit, or hematoma.  Accessory Clinical Findings  ECG - regular sinus rhythm, 66, no acute ST or T changes.  Assessment & Plan  1.  Coronary artery disease/non-ST segment elevation myocardial infarction, subsequent episode of care: Patient presents following recent diagnostic catheterization which was performed secondary to non-ST elevation MI in December. Cath revealed total occlusion of the right coronary artery with left-to-right collaterals. This is felt to represent a chronic total occlusion and he is being medically managed. He is not having any chest pain. He does have chronic dyspnea on exertion in the setting of COPD requiring home O2. He remains on aspirin, statin, and beta blocker therapy.  2. Chronic obstructive pulmonary disease: Status post hospitalization for acute exacerbation along with pneumonia in December. He was discharged home on O2 secondary to  desaturations to 84% on room air. He has been wearing his oxygen sporadically at home and rarely uses an albuterol inhaler. He has requested pulmonology referral which we have happily made for him.  3. Hyperlipidemia: He is now on Crestor therapy. He will require follow-up lipids and LFTs in 6-8 weeks and he would like to have this carried out in [place] with his primary care provider.  4. History of tobacco abuse: Patient quit last month. Continued cessation encouraged.  5. Morbid obesity: Patient would benefit from an exercise plan and is even a candidate for cardiac rehabilitation.  6. Disposition: Follow-up with Dr. Radford Pax in 2-3 months or sooner if necessary. Pulmonology referral made at patient's request.    Murray Hodgkins, NP 05/10/2014, 1:16 PM

## 2014-05-16 DIAGNOSIS — J209 Acute bronchitis, unspecified: Secondary | ICD-10-CM | POA: Diagnosis not present

## 2014-05-16 DIAGNOSIS — A419 Sepsis, unspecified organism: Secondary | ICD-10-CM | POA: Diagnosis not present

## 2014-05-16 DIAGNOSIS — R0902 Hypoxemia: Secondary | ICD-10-CM | POA: Diagnosis not present

## 2014-05-16 DIAGNOSIS — J9601 Acute respiratory failure with hypoxia: Secondary | ICD-10-CM | POA: Diagnosis not present

## 2014-05-21 ENCOUNTER — Ambulatory Visit (INDEPENDENT_AMBULATORY_CARE_PROVIDER_SITE_OTHER): Payer: Commercial Managed Care - HMO | Admitting: Internal Medicine

## 2014-05-21 ENCOUNTER — Encounter: Payer: Self-pay | Admitting: Internal Medicine

## 2014-05-21 VITALS — BP 120/68 | HR 58 | Ht 66.0 in | Wt 194.4 lb

## 2014-05-21 DIAGNOSIS — J449 Chronic obstructive pulmonary disease, unspecified: Secondary | ICD-10-CM | POA: Diagnosis not present

## 2014-05-21 DIAGNOSIS — R0902 Hypoxemia: Secondary | ICD-10-CM | POA: Diagnosis not present

## 2014-05-21 DIAGNOSIS — R0602 Shortness of breath: Secondary | ICD-10-CM

## 2014-05-21 NOTE — Progress Notes (Signed)
Subjective:    Patient ID: Joseph Shields, male    DOB: [birth year],   MRN: [id number]  HPI  69 yowm  Quit 04/07/14 when admit with dx of pna and better since discharge to point where can now get up the hill to feed the dog but at one point couldn't make it up the hill and self referred to pulmonary 05/21/2014 for eval ? Copd  Admit date: 04/13/2014 Discharge date: 04/15/2014  Recommendations for Outpatient Follow-up:  1. Please follow up with cardiology to discuss elective cardiac catheterization - was evaluated by Dr. Fransico Him this admission. Spoke with the card master Trish and states office will give patient a call regarding follow-up appointment. 2. RA oxygen sats to 83% so will dc on home oxygen - reassess once acute bronchitis cleared - suspect will only be a temporary need  3. Schedule follow up appointment with your PCP within 1 week after discharge  Discharge Diagnoses:   SIRS  Acute respiratory failure with hypoxia due to acute bronchitis/failed OP therapy  NSTEMI (non-ST elevated myocardial infarction)/refused IP cath  Psoriasis  Smoker  Cough due to acute bronchitis      History of present illness:  69 year old male patient with history of psoriasis on methotrexate. Presented with complaints of cough and fever. Had initially been evaluated by his primary care physician and was started on azithromycin and cough suppressant therapy but after 3 days of treatment symptoms were worsening with dyspnea on exertion and extreme fatigue.  Evaluation in the ER revealed a mildly hypertensive patient with tachycardia. He was afebrile with room air sats dipping to 83-84%. He had a white count of 20,300. Chest x-ray was suggestive of bronchitis. He also had elevated troponin at 3.79, total CK of 428, and a CKMB of 29. Patient had a prolonged stay in the ER and a second troponin was elevated at 6.16. EKG was nonischemic and only demonstrated sinus tachycardia with  nonspecific ST abnormality. Cardiology was consulted. A stat echo was completed in the ER which revealed normal LVEF without any obvious wall motion abnormalities. CT of the chest had also been accomplished and demonstrated coronary artery calcifications. No indications for emergent catheterization per Cardiology. Because of active wheezing at time of presentation beta blockers were not initiated.   Hospital Course:   NSTEMI Evaluated by cardiology this admission. Initially no beta blockers due to active wheezing at presentation. Heparin drip was initiated. Lipid panel revealed suboptimal HDL at 28. Statin therapy initiated. Cardiology offered cardiac catheterization to the patient but he refused. He is amenable to proceeding with outpatient cardiac catheterization. Patient was ambulated on room air to determine if oxygen therapy would be indicated at discharge. He did not have any chest pain or shortness of breath with ambulation even when O2 sat 83%. Cardiac enzymes trended downward last troponin was 4.21 on 12/20. Our recommendation is that the patient follow-up with cardiology as an outpatient to pursue cardiac catheterization once current respiratory symptoms have resolved. Since no wheezing on date of discharge we have ordered a selective beta blocker carvedilol to begin at 3.125 mg twice a day.  Acute hypoxemic respiratory failure secondary to bronchitis Primary reason for presentation. Was hypoxemic with sats in the mid 80s in the ER. Had previously been treated with 3 days of Zithromax without improvement in symptoms. Started on Levaquin this admission which we'll continue for total of 7 days after discharge. Sputum culture pending at discharge. Gram stain revealed abundant gram negative rods and moderate  gram positive cocci in pairs/clusters. No further wheezing since steroids have been tapered and weaned. No indication at this time to continue steroid taper using prednisone. On room air patient  had an ambulatory sats down to 83% so home oxygen has been ordered. No wheezing with ambulation. We'll continue guaifenesin and cough suppression therapy as prior to admission. Sats w/ ambulation will need to be reassessed once the pt has recovered to his baseline, as it is anticipated that he will NOT need long term O2 support.   Psoriasis on methotrexate Patient had previously held last dosage of methotrexate because he felt himself getting sick and will not resume this medication until current symptoms have resolved.  Nicotine abuse Patient counseled on sequelae of continuing to smoke including death by Dr. Sherral Hammers, as well as Dr. Thereasa Solo  Procedures:  2-D echocardiogram:   - Left ventricle: The cavity size was normal. Wall thickness was normal. Systolic function was normal. The estimated ejection fraction was in the range of 55% to 60%. GLPSS is mildly reduced at -18%, there is focal inferoseptal strain abnormality. Left ventricular diastolic function parameters were normal. - Aortic valve: Sclerosis without stenosis. There was no significant regurgitation. - Mitral valve: Calcified annulus. There was trivial regurgitation. - Left atrium: The atrium was normal in size. - Right atrium: The atrium was mildly dilated.  Subsequent LHC 05/06/14  occ RCA,  LVEDP 15 with nl EF    05/21/2014 [facility] office visit/ Tamalyn Wadsworth   Chief Complaint  Patient presents with  . Pulmonary Consult    Referred by Dr. Estil Daft. Pt c/o SOB for the past 2 months "with anything strenuous".  He also c/o prod cough with white sputum. He states that he was dxed with PNA and bronchitis 04/13/14. He is using albuterol about 1 x per day on average.   breathing better since admit and wants to know does he have copd and does he need 02, maintaining off cigs  Not limited by breathing from desired activities but not aerobic     No obvious day to day or daytime variabilty or assoc excess or purulent  sputum  or cp or chest tightness, subjective wheeze overt sinus or hb symptoms. No unusual exp hx or h/o childhood pna/ asthma or knowledge of premature birth.  Sleeping ok without nocturnal  or early am exacerbation  of respiratory  c/o's or need for noct saba. Also denies any obvious fluctuation of symptoms with weather or environmental changes or other aggravating or alleviating factors except as outlined above   Current Medications, Allergies, Complete Past Medical History, Past Surgical History, Family History, and Social History were reviewed in [employer] record.  ROS  The following are not active complaints unless bolded sore throat, dysphagia, dental problems, itching, sneezing,  nasal congestion or excess/ purulent secretions, ear ache,   fever, chills, sweats, unintended wt loss, pleuritic or exertional cp, hemoptysis,  orthopnea pnd or leg swelling, presyncope, palpitations, heartburn, abdominal pain, anorexia, nausea, vomiting, diarrhea  or change in bowel or urinary habits, change in stools or urine, dysuria,hematuria,  rash, arthralgias, visual complaints, headache, numbness weakness or ataxia or problems with walking or coordination,  change in mood/affect or memory.       Review of Systems  Constitutional: Negative for fever, chills, activity change, appetite change and unexpected weight change.  HENT: Negative for congestion, dental problem, postnasal drip, rhinorrhea, sneezing, sore throat, trouble swallowing and voice change.   Eyes: Negative for visual disturbance.  Respiratory: Positive for cough and shortness of breath. Negative for choking.   Cardiovascular: Negative for chest pain and leg swelling.  Gastrointestinal: Negative for nausea, vomiting and abdominal pain.  Genitourinary: Negative for difficulty urinating.  Musculoskeletal: Negative for arthralgias.  Skin: Negative for rash.  Psychiatric/Behavioral: Negative for behavioral problems and  confusion.       Objective:   Physical Exam  amb wm nad  Wt Readings from Last 3 Encounters:  05/21/14 194 lb 6.4 oz (88.179 kg)  05/10/14 192 lb 12.8 oz (87.454 kg)  05/09/14 192 lb (87.091 kg)    Vital signs reviewed  HEENT: nl dentition, turbinates, and orophanx. Nl external ear canals without cough reflex   NECK :  without JVD/Nodes/TM/ nl carotid upstrokes bilaterally   LUNGS: no acc muscle use, clear to A and P bilaterally without cough on insp or exp maneuvers   CV:  RRR  no s3 or murmur or increase in P2, no edema   ABD:  soft and nontender with nl excursion in the supine position. No bruits or organomegaly, bowel sounds nl  MS:  warm without deformities, calf tenderness, cyanosis or clubbing  SKIN: warm and dry without lesions    NEURO:  alert, approp, no deficits          Assessment & Plan:

## 2014-05-21 NOTE — Assessment & Plan Note (Signed)
Resolved, no indication for 02 at this point

## 2014-05-21 NOTE — Assessment & Plan Note (Signed)
mutifactorial but clearly better after rx for pna and stopped smoking. Note LVEDP 15 at [facility] 05/06/14 and also at risk of abd wt gain s/p smoking cessation/ advised.  rec increase walking as tolerates, f/u pfts pending    Each maintenance medication was reviewed in detail including most importantly the difference between maintenance and as needed and under what circumstances the prns are to be used.  Please see instructions for details which were reviewed in writing and the patient given a copy.

## 2014-05-21 NOTE — Patient Instructions (Addendum)
Only use your albuterol as a rescue medication to be used if you can't catch your breath by resting or doing a relaxed purse lip breathing pattern.  - The less you use it, the better it will work when you need it. - Ok to use up to 2 puffs  every 4 hours if you must but call for immediate appointment if use goes up over your usual need - Don't leave home without it !!  (think of it like the spare tire for your car)   Ok to stop 02  Congratulations on not smoking, it's the most important aspect of your care   Please schedule a follow up office visit in 4 weeks, sooner if needed with pfts and cxr  on return

## 2014-05-21 NOTE — Assessment & Plan Note (Addendum)
-   quit smoking 03/2014  - 05/21/2014  Walked RA x 3 laps @ 185 ft each stopped due to  End of study/ no sob or desat   Much better clinically and probably not 02 or saba dep at this point one m p quit smoking     I reviewed the Flethcher curve with patient that basically indicates  if you quit smoking when your best day FEV1 is still well preserved (as appears to be  the case here)  it is highly unlikely you will progress to severe disease and informed the patient there was no medication on the market that has proven to change the curve or the likelihood of progression.  Therefore    maintaining abstinence is the most important aspect of care, not choice of inhalers or for that matter, doctors.    Cautioned key is not smoking and also avoiding wt gain here  Will have him return in 4 weeks for baseline pfts/cxr -  In meantime emphasized saba is just prn sob

## 2014-06-18 ENCOUNTER — Other Ambulatory Visit: Payer: Self-pay | Admitting: Internal Medicine

## 2014-06-18 DIAGNOSIS — R0600 Dyspnea, unspecified: Secondary | ICD-10-CM

## 2014-06-19 ENCOUNTER — Ambulatory Visit (INDEPENDENT_AMBULATORY_CARE_PROVIDER_SITE_OTHER): Payer: Commercial Managed Care - HMO | Admitting: Internal Medicine

## 2014-06-19 ENCOUNTER — Encounter: Payer: Self-pay | Admitting: Internal Medicine

## 2014-06-19 VITALS — BP 110/72 | HR 53 | Ht 67.0 in | Wt 201.0 lb

## 2014-06-19 DIAGNOSIS — J449 Chronic obstructive pulmonary disease, unspecified: Secondary | ICD-10-CM

## 2014-06-19 DIAGNOSIS — R0600 Dyspnea, unspecified: Secondary | ICD-10-CM | POA: Diagnosis not present

## 2014-06-19 LAB — PULMONARY FUNCTION TEST
DL/VA % pred: 68 %
DL/VA: 3.04 ml/min/mmHg/L
DLCO unc % pred: 55 %
DLCO unc: 15.69 ml/min/mmHg
FEF 25-75 POST: 0.67 L/s
FEF 25-75 Pre: 0.43 L/sec
FEF2575-%CHANGE-POST: 55 %
FEF2575-%PRED-POST: 28 %
FEF2575-%Pred-Pre: 18 %
FEV1-%Change-Post: 17 %
FEV1-%PRED-POST: 41 %
FEV1-%Pred-Pre: 35 %
FEV1-POST: 1.24 L
FEV1-Pre: 1.06 L
FEV1FVC-%CHANGE-POST: 2 %
FEV1FVC-%Pred-Pre: 60 %
FEV6-%Change-Post: 16 %
FEV6-%Pred-Post: 69 %
FEV6-%Pred-Pre: 59 %
FEV6-Post: 2.64 L
FEV6-Pre: 2.26 L
FEV6FVC-%Change-Post: 2 %
FEV6FVC-%Pred-Post: 103 %
FEV6FVC-%Pred-Pre: 101 %
FVC-%Change-Post: 14 %
FVC-%Pred-Post: 67 %
FVC-%Pred-Pre: 58 %
FVC-Post: 2.71 L
FVC-Pre: 2.37 L
PRE FEV1/FVC RATIO: 45 %
PRE FEV6/FVC RATIO: 96 %
Post FEV1/FVC ratio: 46 %
Post FEV6/FVC ratio: 98 %
RV % pred: 206 %
RV: 4.63 L
TLC % pred: 118 %
TLC: 7.61 L

## 2014-06-19 MED ORDER — BUDESONIDE-FORMOTEROL FUMARATE 160-4.5 MCG/ACT IN AERO: INHALATION_SPRAY | RESPIRATORY_TRACT | Status: DC

## 2014-06-19 NOTE — Progress Notes (Signed)
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 Please follow up with cardiology to discuss elective cardiac catheterization - was evaluated by Dr. Fransico Him this admission. Spoke with the card master Trish and states office will give patient a call regarding follow-up appointment. 2. RA oxygen sats to 83% so will dc on home oxygen - reassess once acute bronchitis cleared - suspect will only be a temporary need  3. Schedule follow up appointment with your PCP within 1 week after discharge  Discharge Diagnoses:   SIRS  Acute respiratory failure with hypoxia due to acute bronchitis/failed OP therapy  NSTEMI (non-ST elevated myocardial infarction)/refused IP cath  Psoriasis  Smoker   Cough due to acute bronchitis      History of present illness:  69 year old male patient with history of psoriasis on methotrexate. Presented with complaints of cough and fever. Had initially been evaluated by his primary care physician and was started on azithromycin and cough suppressant therapy but after 3 days of treatment symptoms were worsening with dyspnea on exertion and extreme fatigue.  Evaluation in the ER revealed a mildly hypertensive patient with tachycardia. He was afebrile with room air sats dipping to 83-84%. He had a white count of 20,300. Chest x-ray was suggestive of bronchitis. He also had elevated troponin at 3.79, total CK of 428, and a CKMB of 29. Patient had a prolonged stay in the ER and a second troponin was elevated at 6.16. EKG was  nonischemic and only demonstrated sinus tachycardia with nonspecific ST abnormality. Cardiology was consulted. A stat echo was completed in the ER which revealed normal LVEF without any obvious wall motion abnormalities. CT of the chest had also been accomplished and demonstrated coronary artery calcifications. No indications for emergent catheterization per Cardiology. Because of active wheezing at time of presentation beta blockers were not initiated.   Hospital Course:   NSTEMI Evaluated by cardiology this admission. Initially no beta blockers due to active wheezing at presentation. Heparin drip was initiated. Lipid panel revealed suboptimal HDL at 28. Statin therapy initiated. Cardiology offered cardiac catheterization to the patient but he refused. He is amenable to proceeding with outpatient cardiac catheterization. Patient was ambulated on room air to determine if oxygen therapy would be indicated at discharge. He did not have any chest pain or shortness of breath with ambulation even when O2 sat 83%. Cardiac enzymes trended downward last troponin was 4.21 on 12/20. Our recommendation is that the patient follow-up with cardiology as an outpatient to pursue cardiac catheterization once current respiratory symptoms have resolved. Since no wheezing on date of discharge we have ordered a selective beta blocker carvedilol to begin at 3.125 mg twice a day.  Acute hypoxemic respiratory failure secondary to bronchitis Primary reason for presentation. Was hypoxemic with sats in the mid 80s in the ER. Had previously been treated with 3 days of Zithromax without improvement in symptoms. Started on Levaquin this admission which we'll continue for total of 7 days after discharge. Sputum culture  pending at discharge. Gram stain revealed abundant gram negative rods and moderate gram positive cocci in pairs/clusters. No further wheezing since steroids have been tapered and weaned. No indication at this time to  continue steroid taper using prednisone. On room air patient had an ambulatory sats down to 83% so home oxygen has been ordered. No wheezing with ambulation. We'll continue guaifenesin and cough suppression therapy as prior to admission. Sats w/ ambulation will need to be reassessed once the pt has recovered to his baseline, as it is anticipated that he will NOT need long term O2 support.   Psoriasis on methotrexate Patient had previously held last dosage of methotrexate because he felt himself getting sick and will not resume this medication until current symptoms have resolved.  Nicotine abuse Patient counseled on sequelae of continuing to smoke including death by Dr. Sherral Hammers, as well as Dr. Thereasa Solo  Procedures:  2-D echocardiogram:   - Left ventricle: The cavity size was normal. Wall thickness was normal. Systolic function was normal. The estimated ejection fraction was in the range of 55% to 60%. GLPSS is mildly reduced at -18%, there is focal inferoseptal strain abnormality. Left ventricular diastolic function parameters were normal. - Aortic valve: Sclerosis without stenosis. There was no significant regurgitation. - Mitral valve: Calcified annulus. There was trivial regurgitation. - Left atrium: The atrium was normal in size. - Right atrium: The atrium was mildly dilated.  Subsequent LHC 05/06/14  occ RCA,  LVEDP 15 with nl EF    05/21/2014 1st [facility] office visit/ [facility]   Chief Complaint  Patient presents with  . Pulmonary Consult    Referred by Dr. Estil Daft. Pt c/o SOB for the past 2 months "with anything strenuous".  He also c/o prod cough with white sputum. He states that he was dxed with PNA and bronchitis 04/13/14. He is using albuterol about 1 x per day on average.   breathing better since admit and wants to know does he have copd and does he need 02, maintaining off cigs Not limited by breathing from desired activities but not aerobic    06/19/2014 f/u  ov/[facility] re: GOLD III copd  Chief Complaint  Patient presents with  . Follow-up    Pt states that his breathing has improved some, but not back to his normal baseline. He states that he has used rescue inhaler only 3 x in the past month.   goal is to be able to walk fast - does fine slow pace, does fine sleeping/ sitting. Not using saba much at all at this point/ no noct or early am symptoms or coughing    No obvious day to day or daytime variabilty or assoc cp or chest tightness, subjective wheeze overt sinus or hb symptoms. No unusual exp hx or h/o childhood pna/ asthma or knowledge of premature birth.  Sleeping ok without nocturnal  or early am exacerbation  of respiratory  c/o's or need for noct saba. Also denies any obvious fluctuation of symptoms with weather or environmental changes or other aggravating or alleviating factors except as outlined above   Current Medications, Allergies, Complete Past Medical History, Past Surgical History, Family History, and Social History were reviewed in [employer] record.  ROS  The following are not active complaints unless bolded sore throat, dysphagia, dental problems, itching, sneezing,  nasal congestion or excess/ purulent secretions, ear ache,   fever, chills, sweats, unintended wt loss, pleuritic or exertional cp, hemoptysis,  orthopnea pnd or leg swelling, presyncope,  palpitations, heartburn, abdominal pain, anorexia, nausea, vomiting, diarrhea  or change in bowel or urinary habits, change in stools or urine, dysuria,hematuria,  rash, arthralgias, visual complaints, headache, numbness weakness or ataxia or problems with walking or coordination,  change in mood/affect or memory.           Objective:   Physical Exam  amb wm nad   .06/19/2014       201  Wt Readings from Last 3 Encounters:  05/21/14 194 lb 6.4 oz (88.179 kg)  05/10/14 192 lb 12.8 oz (87.454 kg)  05/09/14 192 lb (87.091 kg)    Vital signs  reviewed  HEENT: nl dentition, turbinates, and orophanx. Nl external ear canals without cough reflex   NECK :  without JVD/Nodes/TM/ nl carotid upstrokes bilaterally   LUNGS: no acc muscle use, clear to A and P bilaterally without cough on insp or exp maneuvers   CV:  RRR  no s3 or murmur or increase in P2, no edema   ABD:  soft and nontender with nl excursion in the supine position. No bruits or organomegaly, bowel sounds nl  MS:  warm without deformities, calf tenderness, cyanosis or clubbing  SKIN: warm and dry without lesions    NEURO:  alert, approp, no deficits          Assessment & Plan:

## 2014-06-19 NOTE — Patient Instructions (Signed)
Start symbicort 160 Take 2 puffs first thing in am and then another 2 puffs about 12 hours later.   Only use your albuterol as a rescue medication to be used if you can't catch your breath by resting or doing a relaxed purse lip breathing pattern.  - The less you use it, the better it will work when you need it. - Ok to use up to 2 puffs  every 4 hours if you must but call for immediate appointment if use goes up over your usual need - Don't leave home without it !!  (think of it like the spare tire for your car)   Please schedule a follow up visit in 3 months but call sooner if needed

## 2014-06-19 NOTE — Progress Notes (Signed)
PFT done today. 

## 2014-06-19 NOTE — Assessment & Plan Note (Signed)
-   quit smoking 03/2014  - 05/21/2014  Walked RA x 3 laps @ 185 ft each stopped due to  End of study/ no sob or desat  - 06/19/2014 pfts  FEV1  1.24 (41%) ratio 46 p 17% improvement from saba, dlco 55 corrects to 68     The proper method of use, as well as anticipated side effects, of a metered-dose inhaler are discussed and demonstrated to the patient. Improved effectiveness after extensive coaching during this visit to a level of approximately  90%  I had an extended discussion with the patient reviewing all relevant studies completed to date and  lasting 15 to 20 minutes of a 25 minute visit on the following ongoing concerns:  1) GOLD III mod severe but only thing he can't do now is get in a hurry/ key is to maintain off cigs   2) 17% better p saba is a min/ not a max resp he can expect from symbicort 160 > 2 weeks trial reasonable  3) Each maintenance medication was reviewed in detail including most importantly the difference between maintenance and as needed and under what circumstances the prns are to be used.  Please see instructions for details which were reviewed in writing and the patient given a copy.

## 2014-07-16 DIAGNOSIS — L409 Psoriasis, unspecified: Secondary | ICD-10-CM | POA: Diagnosis not present

## 2014-08-06 ENCOUNTER — Ambulatory Visit (INDEPENDENT_AMBULATORY_CARE_PROVIDER_SITE_OTHER): Payer: Commercial Managed Care - HMO | Admitting: Cardiology

## 2014-08-06 ENCOUNTER — Encounter: Payer: Self-pay | Admitting: Cardiology

## 2014-08-06 VITALS — BP 140/82 | HR 71 | Ht 66.0 in | Wt 207.8 lb

## 2014-08-06 DIAGNOSIS — E785 Hyperlipidemia, unspecified: Secondary | ICD-10-CM

## 2014-08-06 DIAGNOSIS — Z720 Tobacco use: Secondary | ICD-10-CM | POA: Diagnosis not present

## 2014-08-06 DIAGNOSIS — I2510 Atherosclerotic heart disease of native coronary artery without angina pectoris: Secondary | ICD-10-CM

## 2014-08-06 DIAGNOSIS — I2583 Coronary atherosclerosis due to lipid rich plaque: Principal | ICD-10-CM

## 2014-08-06 DIAGNOSIS — F17200 Nicotine dependence, unspecified, uncomplicated: Secondary | ICD-10-CM

## 2014-08-06 NOTE — Patient Instructions (Addendum)
Medication Instructions:  Your physician recommends that you continue on your current medications as directed. Please refer to the Current Medication list given to you today.   Labwork: Your physician recommends that you return for FASTING lab work on Monday, April 18. You may come ANYTIME between 7:30 AM and 5:00 PM.  Testing/Procedures: None  Follow-Up: Your physician wants you to follow-up in: 6 months with Dr. Radford Pax. You will receive a reminder letter in the mail two months in advance. If you don't receive a letter, please call our office to schedule the follow-up appointment.   Any Other Special Instructions Will Be Listed Below (If Applicable).

## 2014-08-06 NOTE — Progress Notes (Signed)
Cardiology Office Note   Date:  08/06/2014   ID:  Joseph Shields, DOB 1945/09/17, MRN [id number]  PCP:  Sharion Balloon, FNP    No chief complaint on file.     History of Present Illness: Joseph Shields is a 69 y.o. male with no prior cardiac history who presents today for hospital followup. He was initially admitted with cough and fever and SOB. He was hypoxic on admission with WBC of 20K. His chest xray was c/w bronchitis and he was treated with antibiotics. He was also noted to have an elevated troponin at 6 and EKG showed nonspecific St abnormality. Cardiology was consulted and echo showed normal LVF with no RWMA's. Chest CT showed coronary artery calcifications. Cardiac cath was recommended but patient refused. He wanted to wait and be set up as an outpt. 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  It was felt that medical management was best option unless he became symptomatic and then could consider CTO PCI of RCA. He was started on lipitor and stopped it due to muscle aches. He also stopped the coreg because he said it made his legs swell. He presents back today for followup.   He has not seen any further chest pain. He has chronic SOB from COPD which is stable.  He has quit smoking.  He denies any LE edema, dizziness, palpitations or syncope.     Past Medical History  Diagnosis Date  . CAD (coronary artery disease)     a. 03/2014 NSTEMI - initially refused cath;  b. 03/2014 Echo: EF 55-60%;  c. 04/2014 Cath: LM nl, LAD 20-30p, 49m, D1 30-40, D2 small, 60-70, LCX nl, RCA 100p (CTO), L->R collaterals, EF 55-60%-->Med Rx.  [name] COPD (chronic obstructive pulmonary disease)     a. On home O2.  [name] History of tobacco abuse     a. 40+ pack years, quit 03/2014.  [name] Hyperlipidemia   . Morbid obesity   . Psoriasis     Past Surgical  History  Procedure Laterality Date  . Spine surgery    . Left heart catheterization with coronary angiogram N/A 05/06/2014    Procedure: LEFT HEART CATHETERIZATION WITH CORONARY ANGIOGRAM;  Surgeon: Leonie Man, MD;  Location: [facility] CATH LAB;  Service: Cardiovascular;  Laterality: N/A;     Current Outpatient Prescriptions  Medication Sig Dispense Refill  . albuterol (PROVENTIL HFA;VENTOLIN HFA) 108 (90 BASE) MCG/ACT inhaler Inhale 2 puffs into the lungs every 6 (six) hours as needed for wheezing or shortness of breath. 1 Inhaler 2  . aspirin EC 81 MG tablet Take 81 mg by mouth daily.    . budesonide-formoterol (SYMBICORT) 160-4.5 MCG/ACT inhaler Take 2 puffs first thing in am and then another 2 puffs about 12 hours later. 1 Inhaler 11  . cetirizine (ZYRTEC) 10 MG tablet Take 10 mg by mouth as needed for allergies.     . Cholecalciferol 1000 UNITS capsule Take 1,000 Units by mouth daily.    . folic acid (FOLVITE) 1 MG tablet Take 1 mg by mouth daily.    . metoprolol succinate (TOPROL XL) 25 MG 24 hr tablet Take 1 tablet (25 mg total) by mouth daily. 30 tablet 3  . rosuvastatin (CRESTOR) 5 MG tablet Take 1 tablet (5 mg total) by mouth daily. 30 tablet 3   No current facility-administered medications for this  visit.    Allergies:   Flonase and Penicillins    Social History:  The patient  reports that he quit smoking about 3 months ago. His smoking use included Cigarettes. He has a 24.5 pack-year smoking history. He has never used smokeless tobacco. He reports that he does not drink alcohol or use illicit drugs.   Family History:  The patient's family history includes Allergies in his mother; Asthma in his mother; Heart disease in his father; Stroke in his mother.    ROS:  Please see the history of present illness.   Otherwise, review of systems are positive for none.   All other systems are reviewed and negative.    PHYSICAL EXAM: VS:  BP 140/82 mmHg  Pulse 71  Ht 5\' 6"  (1.676 m)   Wt 207 lb 12.8 oz (94.257 kg)  BMI 33.56 kg/m2  SpO2 97% , BMI Body mass index is 33.56 kg/(m^2). GEN: Well nourished, well developed, in no acute distress HEENT: normal Neck: no JVD, carotid bruits, or masses Cardiac: RRR; no murmurs, rubs, or gallops,no edema  Respiratory:  clear to auscultation bilaterally, normal work of breathing GI: soft, nontender, nondistended, + BS MS: no deformity or atrophy Skin: warm and dry, no rash Neuro:  Strength and sensation are intact Psych: euthymic mood, full affect   EKG:  EKG is not ordered today.    Recent Labs: 04/13/2014: Pro B Natriuretic peptide (BNP) 1376.0* 04/15/2014: ALT 30; Magnesium 2.3 05/03/2014: BUN 12; Creatinine 0.9; Hemoglobin 12.8*; Platelets 264.0; Potassium 4.7; Sodium 138    Lipid Panel    Component Value Date/Time   CHOL 140 04/15/2014 0252   TRIG 82 04/15/2014 0252   HDL 28* 04/15/2014 0252   CHOLHDL 5.0 04/15/2014 0252   VLDL 16 04/15/2014 0252   LDLCALC 96 04/15/2014 0252      Wt Readings from Last 3 Encounters:  08/06/14 207 lb 12.8 oz (94.257 kg)  06/19/14 201 lb (91.173 kg)  05/21/14 194 lb 6.4 oz (88.179 kg)    ASSESSMENT AND PLAN:  1.ASCAD with remote NSTEMI  in the setting of acute bronchitis with hypoxia. Initially felt to be due to demand ischemia but his troponin went to 6 and Chest CT showed coronary artery calcifications. Cath showed occluded RCA with left to left collaterals and otherwise nonobstructive disease.  Continue ASA.  2. Tobacco abuse - he has quit and I congratulated him 3. Dyslipidemia - he has not tolerated the lipitor due to muscle aches. I will try Crestor 5mg  daily and recheck FLP and ALT     Current medicines are reviewed at length with the patient today.  The patient does not have concerns regarding medicines.  The following changes have been made:  no change  Labs/ tests ordered today include: see above assessment and plan  Orders Placed This Encounter    Procedures  . Lipid Profile  . Hepatic function panel     Disposition:   FU with me in 6 month   Signed, [name], MD  08/06/2014 2:11 PM    [facility] [place], [place], [place]  [address] Phone: [phone]; Fax: [phone]

## 2014-08-12 ENCOUNTER — Other Ambulatory Visit (INDEPENDENT_AMBULATORY_CARE_PROVIDER_SITE_OTHER): Payer: Commercial Managed Care - HMO | Admitting: *Deleted

## 2014-08-12 ENCOUNTER — Telehealth: Payer: Self-pay

## 2014-08-12 DIAGNOSIS — I2510 Atherosclerotic heart disease of native coronary artery without angina pectoris: Secondary | ICD-10-CM

## 2014-08-12 DIAGNOSIS — I2583 Coronary atherosclerosis due to lipid rich plaque: Principal | ICD-10-CM

## 2014-08-12 DIAGNOSIS — E785 Hyperlipidemia, unspecified: Secondary | ICD-10-CM | POA: Diagnosis not present

## 2014-08-12 LAB — LIPID PANEL
CHOLESTEROL: 145 mg/dL (ref 0–200)
HDL: 41.5 mg/dL (ref >39.00)
LDL Cholesterol: 85 mg/dL (ref 0–99)
NonHDL: 103.5
Total CHOL/HDL Ratio: 3
Triglycerides: 93 mg/dL (ref 0.0–149.0)
VLDL: 18.6 mg/dL (ref 0.0–40.0)

## 2014-08-12 LAB — HEPATIC FUNCTION PANEL
ALK PHOS: 53 U/L (ref 39–117)
ALT: 21 U/L (ref 0–53)
AST: 21 U/L (ref 0–37)
Albumin: 4.1 g/dL (ref 3.5–5.2)
BILIRUBIN DIRECT: 0.1 mg/dL (ref 0.0–0.3)
BILIRUBIN TOTAL: 0.8 mg/dL (ref 0.2–1.2)
Total Protein: 6.8 g/dL (ref 6.0–8.3)

## 2014-08-12 MED ORDER — ROSUVASTATIN CALCIUM 10 MG PO TABS: 10.0000 mg | ORAL_TABLET | Freq: Every day | ORAL | Status: DC

## 2014-08-12 NOTE — Telephone Encounter (Signed)
-----   Message from [name], MD sent at 08/12/2014  2:52 PM EDT ----- LDL not at goal - increase crestor to 10mg  daily and recheck FLP and ALT in 6 weeks

## 2014-08-12 NOTE — Telephone Encounter (Signed)
Informed patient of results and verbal understanding expressed.  Instructed patient to INCREASE CRESTOR to 10 mg daily. FLP and ALT scheduled for 6/6.   Patient agrees with treatment plan.

## 2014-08-28 ENCOUNTER — Other Ambulatory Visit: Payer: Self-pay | Admitting: Cardiology

## 2014-09-30 ENCOUNTER — Other Ambulatory Visit (INDEPENDENT_AMBULATORY_CARE_PROVIDER_SITE_OTHER): Payer: Commercial Managed Care - HMO | Admitting: *Deleted

## 2014-09-30 DIAGNOSIS — E785 Hyperlipidemia, unspecified: Secondary | ICD-10-CM | POA: Diagnosis not present

## 2014-09-30 LAB — LIPID PANEL
CHOL/HDL RATIO: 3
Cholesterol: 129 mg/dL (ref 0–200)
HDL: 41.7 mg/dL (ref >39.00)
LDL Cholesterol: 76 mg/dL (ref 0–99)
NONHDL: 87.3
TRIGLYCERIDES: 59 mg/dL (ref 0.0–149.0)
VLDL: 11.8 mg/dL (ref 0.0–40.0)

## 2014-09-30 LAB — ALT: ALT: 22 U/L (ref 0–53)

## 2014-10-04 ENCOUNTER — Telehealth: Payer: Self-pay

## 2014-10-04 DIAGNOSIS — E785 Hyperlipidemia, unspecified: Secondary | ICD-10-CM

## 2014-10-04 MED ORDER — ROSUVASTATIN CALCIUM 20 MG PO TABS: 20.0000 mg | ORAL_TABLET | Freq: Every day | ORAL | Status: DC

## 2014-10-04 NOTE — Telephone Encounter (Signed)
Instructed patient to INCREASE CRESTOR to 20 mg daily. FLP and ALT scheduled for July 25. Patient agrees with treatment plan.

## 2014-10-04 NOTE — Telephone Encounter (Signed)
-----   Message from [name], MD sent at 10/03/2014  1:49 PM EDT ----- Increase Crestor to 20mg  daily and recheck FLp and ALT in 6 weeks.  Please have patient call if he develops side effects

## 2014-10-07 ENCOUNTER — Encounter: Payer: Self-pay | Admitting: Family Medicine

## 2014-10-07 ENCOUNTER — Ambulatory Visit (INDEPENDENT_AMBULATORY_CARE_PROVIDER_SITE_OTHER): Payer: Commercial Managed Care - HMO | Admitting: Family Medicine

## 2014-10-07 VITALS — BP 132/76 | HR 60 | Temp 98.1°F | Ht 66.0 in | Wt 208.6 lb

## 2014-10-07 DIAGNOSIS — L409 Psoriasis, unspecified: Secondary | ICD-10-CM

## 2014-10-07 MED ORDER — FOLIC ACID 1 MG PO TABS: 1.0000 mg | ORAL_TABLET | Freq: Every day | ORAL | Status: DC

## 2014-10-07 MED ORDER — METHOTREXATE 2.5 MG PO TABS: 2.5000 mg | ORAL_TABLET | ORAL | Status: DC

## 2014-10-07 NOTE — Progress Notes (Signed)
Subjective:  Patient ID: Joseph Shields, male    DOB: [birth year]  Age: 69 y.o. MRN: [id number]  CC: Psoriasis   HPI Joseph Shields presents for follow-up of psoriasis. He has been taking methotrexate 4 pills weekly with 1 mg a day of folic acid until January of this year. He was post go back to his dermatologist. However, he had pneumonia which led to a heart attack and missed his appointment. Since then he has slowly been developing more and more plaque. Currently he is in misery because it's all over his body. It's on his hands and palms as well as the trunk and extremities. The treatment has worked quite well in the past and he would like to be considered just for a newly here and follow-up with the dermatologist as needed.  History Joseph Shields has a past medical history of CAD (coronary artery disease); COPD (chronic obstructive pulmonary disease); History of tobacco abuse; Hyperlipidemia; Morbid obesity; and Psoriasis.   He has past surgical history that includes Spine surgery and left heart catheterization with coronary angiogram (N/A, 05/06/2014).   His family history includes Allergies in his mother; Asthma in his mother; Heart disease in his father; Stroke in his mother.He reports that he quit smoking about 6 months ago. His smoking use included Cigarettes. He has a 24.5 pack-year smoking history. He has never used smokeless tobacco. He reports that he does not drink alcohol or use illicit drugs.  Outpatient Prescriptions Prior to Visit  Medication Sig Dispense Refill  . aspirin EC 81 MG tablet Take 81 mg by mouth daily.    . cetirizine (ZYRTEC) 10 MG tablet Take 10 mg by mouth as needed for allergies.     . metoprolol succinate (TOPROL-XL) 25 MG 24 hr tablet TAKE ONE TABLET BY MOUTH ONCE DAILY 30 tablet 5  . rosuvastatin (CRESTOR) 20 MG tablet Take 1 tablet (20 mg total) by mouth daily. 30 tablet 6  . albuterol (PROVENTIL HFA;VENTOLIN HFA) 108 (90 BASE) MCG/ACT inhaler Inhale 2 puffs  into the lungs every 6 (six) hours as needed for wheezing or shortness of breath. (Patient not taking: Reported on 10/07/2014) 1 Inhaler 2  . budesonide-formoterol (SYMBICORT) 160-4.5 MCG/ACT inhaler Take 2 puffs first thing in am and then another 2 puffs about 12 hours later. (Patient not taking: Reported on 10/07/2014) 1 Inhaler 11  . Cholecalciferol 1000 UNITS capsule Take 1,000 Units by mouth daily.    . folic acid (FOLVITE) 1 MG tablet Take 1 mg by mouth daily.     No facility-administered medications prior to visit.    ROS Review of Systems  Constitutional: Negative for fever, chills and diaphoresis.  HENT: Negative for congestion, rhinorrhea and sore throat.   Respiratory: Negative for cough, shortness of breath and wheezing.   Cardiovascular: Negative for chest pain.  Gastrointestinal: Negative for nausea, vomiting, abdominal pain, diarrhea, constipation and abdominal distention.  Genitourinary: Negative for dysuria and frequency.  Musculoskeletal: Negative for joint swelling and arthralgias.  Skin: Negative for rash.  Neurological: Negative for headaches.    Objective:  BP 132/76 mmHg  Pulse 60  Temp(Src) 98.1 F (36.7 C) (Oral)  Ht 5\' 6"  (1.676 m)  Wt 208 lb 9.6 oz (94.62 kg)  BMI 33.68 kg/m2  BP Readings from Last 3 Encounters:  10/07/14 132/76  08/06/14 140/82  06/19/14 110/72    Wt Readings from Last 3 Encounters:  10/07/14 208 lb 9.6 oz (94.62 kg)  08/06/14 207 lb 12.8 oz (94.257 kg)  06/19/14 201 lb (91.173 kg)     Physical Exam  Constitutional: He appears well-developed and well-nourished.  HENT:  Head: Normocephalic and atraumatic.  Cardiovascular: Normal rate and regular rhythm.   Pulmonary/Chest: Effort normal and breath sounds normal.  Skin: Skin is warm and dry. Rash (the patient has multiple plaques varying in size from 3-15  centimeters read these cover much of the back chest abdomen and upper extremities. The palms of the hands have a erythema  with thick silvery scale. The forearms are erythematous with scant scale.) noted. There is erythema.    Lab Results  Component Value Date   HGBA1C 6.0* 04/15/2014    Lab Results  Component Value Date   WBC 6.0 05/03/2014   HGB 12.8* 05/03/2014   HCT 39.1 05/03/2014   PLT 264.0 05/03/2014   GLUCOSE 95 05/03/2014   CHOL 129 09/30/2014   TRIG 59.0 09/30/2014   HDL 41.70 09/30/2014   LDLCALC 76 09/30/2014   ALT 22 09/30/2014   AST 21 08/12/2014   NA 138 05/03/2014   K 4.7 05/03/2014   CL 105 05/03/2014   CREATININE 0.9 05/03/2014   BUN 12 05/03/2014   CO2 28 05/03/2014   INR 1.0 05/03/2014   HGBA1C 6.0* 04/15/2014    No results found.  Assessment & Plan:   There are no diagnoses linked to this encounter. I am having Joseph Shields maintain his cetirizine, Cholecalciferol, folic acid, aspirin EC, albuterol, budesonide-formoterol, metoprolol succinate, and rosuvastatin.  No orders of the defined types were placed in this encounter.     Follow-up: No Follow-up on file.  Joseph Shields, M.D.

## 2014-10-08 LAB — CMP14+EGFR
ALK PHOS: 55 IU/L (ref 39–117)
ALT: 21 IU/L (ref 0–44)
AST: 25 IU/L (ref 0–40)
Albumin/Globulin Ratio: 1.9 (ref 1.1–2.5)
Albumin: 4.5 g/dL (ref 3.6–4.8)
BUN / CREAT RATIO: 17 (ref 10–22)
BUN: 17 mg/dL (ref 8–27)
Bilirubin Total: 0.5 mg/dL (ref 0.0–1.2)
CO2: 22 mmol/L (ref 18–29)
CREATININE: 1 mg/dL (ref 0.76–1.27)
Calcium: 9.2 mg/dL (ref 8.6–10.2)
Chloride: 101 mmol/L (ref 97–108)
GFR calc Af Amer: 90 mL/min/{1.73_m2} (ref >59)
GFR calc non Af Amer: 78 mL/min/{1.73_m2} (ref >59)
GLOBULIN, TOTAL: 2.4 g/dL (ref 1.5–4.5)
GLUCOSE: 92 mg/dL (ref 65–99)
POTASSIUM: 4.7 mmol/L (ref 3.5–5.2)
SODIUM: 140 mmol/L (ref 134–144)
Total Protein: 6.9 g/dL (ref 6.0–8.5)

## 2014-10-09 ENCOUNTER — Other Ambulatory Visit: Payer: Self-pay | Admitting: *Deleted

## 2014-10-09 ENCOUNTER — Telehealth: Payer: Self-pay | Admitting: Family Medicine

## 2014-10-09 MED ORDER — METHOTREXATE 2.5 MG PO TABS: ORAL_TABLET | ORAL | Status: DC

## 2014-10-09 NOTE — Telephone Encounter (Signed)
Patient returned call and stated that methotrexate was sent in incorrectly for 1 pill a week and it should be 4. Resent to pharmacy with correct dose

## 2014-10-10 ENCOUNTER — Other Ambulatory Visit: Payer: Self-pay | Admitting: Family Medicine

## 2014-10-10 MED ORDER — METHOTREXATE 2.5 MG PO TABS: ORAL_TABLET | ORAL | Status: DC

## 2014-11-18 ENCOUNTER — Other Ambulatory Visit (INDEPENDENT_AMBULATORY_CARE_PROVIDER_SITE_OTHER): Payer: Commercial Managed Care - HMO | Admitting: *Deleted

## 2014-11-18 DIAGNOSIS — E785 Hyperlipidemia, unspecified: Secondary | ICD-10-CM | POA: Diagnosis not present

## 2014-11-18 LAB — LIPID PANEL
CHOL/HDL RATIO: 4
Cholesterol: 143 mg/dL (ref 0–200)
HDL: 39.6 mg/dL (ref >39.00)
LDL Cholesterol: 89 mg/dL (ref 0–99)
NonHDL: 103.4
TRIGLYCERIDES: 73 mg/dL (ref 0.0–149.0)
VLDL: 14.6 mg/dL (ref 0.0–40.0)

## 2014-11-18 LAB — ALT: ALT: 21 U/L (ref 0–53)

## 2014-11-19 ENCOUNTER — Telehealth: Payer: Self-pay

## 2014-11-19 DIAGNOSIS — E785 Hyperlipidemia, unspecified: Secondary | ICD-10-CM

## 2014-11-19 MED ORDER — EZETIMIBE 10 MG PO TABS: 10.0000 mg | ORAL_TABLET | Freq: Every day | ORAL | Status: DC

## 2014-11-19 NOTE — Telephone Encounter (Signed)
Instructed patient to START ZETIA 10 mg daily. Fasting labs scheduled for October 3.  Patient agrees with treatment plan.

## 2014-11-19 NOTE — Telephone Encounter (Signed)
-----   Message from Aris Georgia, [facility] sent at 11/19/2014  3:12 PM EDT ----- Would add Zetia 10mg  daily to his Crestor to get pt to goal.

## 2014-11-27 ENCOUNTER — Encounter: Payer: Self-pay | Admitting: Cardiology

## 2015-01-27 ENCOUNTER — Other Ambulatory Visit (INDEPENDENT_AMBULATORY_CARE_PROVIDER_SITE_OTHER): Payer: Commercial Managed Care - HMO | Admitting: *Deleted

## 2015-01-27 DIAGNOSIS — E785 Hyperlipidemia, unspecified: Secondary | ICD-10-CM | POA: Diagnosis not present

## 2015-01-27 LAB — LIPID PANEL
Cholesterol: 107 mg/dL (ref 0–200)
HDL: 42.1 mg/dL (ref >39.00)
LDL Cholesterol: 52 mg/dL (ref 0–99)
NonHDL: 65.3
TRIGLYCERIDES: 69 mg/dL (ref 0.0–149.0)
Total CHOL/HDL Ratio: 3
VLDL: 13.8 mg/dL (ref 0.0–40.0)

## 2015-01-27 LAB — ALT: ALT: 29 U/L (ref 0–53)

## 2015-02-12 NOTE — Progress Notes (Signed)
Cardiology Office Note   Date:  02/13/2015   ID:  Joseph Shields, DOB 11/19/1945, MRN [id number]  PCP:  [name], MD    Chief Complaint  Patient presents with  . Coronary Artery Disease  . Hyperlipidemia      History of Present Illness: Joseph Shields is a 69 y.o. 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 He also stopped the coreg because he said it made his legs swell. He presents back today for followup. He has not had any further chest pain. He has chronic SOB from COPD which is stable. He has quit smoking. He denies any LE edema, dizziness, palpitations or syncope.     Past Medical History  Diagnosis Date  . CAD (coronary artery disease)     a. 03/2014 NSTEMI - initially refused cath;  b. 03/2014 Echo: EF 55-60%;  c. 04/2014 Cath: LM nl, LAD 20-30p, 51m, D1 30-40, D2 small, 60-70, LCX nl, RCA 100p (CTO), L->R collaterals, EF 55-60%-->Med Rx.  [name] COPD (chronic obstructive pulmonary disease) ([place])     a. On home O2.  [name] History of tobacco abuse     a. 40+ pack years, quit 03/2014.  [name] Hyperlipidemia   . Morbid obesity ([place])   . Psoriasis     Past Surgical History  Procedure Laterality Date  . Spine surgery    . Left heart catheterization with coronary angiogram N/A 05/06/2014    Procedure: LEFT HEART CATHETERIZATION WITH CORONARY ANGIOGRAM;  Surgeon: Leonie Man, MD;  Location: [facility] CATH LAB;  Service: Cardiovascular;  Laterality: N/A;     Current Outpatient Prescriptions  Medication Sig Dispense Refill  . albuterol (PROVENTIL HFA;VENTOLIN HFA) 108 (90 BASE) MCG/ACT inhaler Inhale 2 puffs into the lungs every 6 (six) hours as needed for wheezing or shortness of breath. 1 Inhaler 2  . aspirin EC 81 MG tablet Take 81 mg by mouth daily.    .  budesonide-formoterol (SYMBICORT) 160-4.5 MCG/ACT inhaler Take 2 puffs first thing in am and then another 2 puffs about 12 hours later. 1 Inhaler 11  . cetirizine (ZYRTEC) 10 MG tablet Take 10 mg by mouth as needed for allergies.     . Cholecalciferol 1000 UNITS capsule Take 1,000 Units by mouth daily.    [name] ezetimibe (ZETIA) 10 MG tablet Take 1 tablet (10 mg total) by mouth daily. 30 tablet 11  . folic acid (FOLVITE) 1 MG tablet Take 1 tablet (1 mg total) by mouth daily. 30 tablet 5  . methotrexate (RHEUMATREX) 2.5 MG tablet Caution:Chemotherapy. Protect from light. Take 4 pills weekly 16 tablet 5  . metoprolol succinate (TOPROL-XL) 25 MG 24 hr tablet TAKE ONE TABLET BY MOUTH ONCE DAILY 30 tablet 5  . rosuvastatin (CRESTOR) 20 MG tablet Take 1 tablet (20 mg total) by mouth daily. (Patient taking differently: Take 10 mg by mouth daily. ) 30 tablet 6   No current facility-administered medications for this visit.    Allergies:   Flonase and Penicillins    Social History:  The patient  reports that he quit smoking about 10 months ago. His smoking use included Cigarettes. He has a 24.5 pack-year smoking history. He has never used smokeless tobacco. He reports  that he does not drink alcohol or use illicit drugs.   Family History:  The patient's family history includes Allergies in his mother; Asthma in his mother; Heart disease in his father; Stroke in his mother.    ROS:  Please see the history of present illness.   Otherwise, review of systems are positive for none.   All other systems are reviewed and negative.    PHYSICAL EXAM: VS:  BP 140/70 mmHg  Pulse 58  Ht 5\' 6"  (1.676 m)  Wt 212 lb (96.163 kg)  BMI 34.23 kg/m2  SpO2 93% , BMI Body mass index is 34.23 kg/(m^2). GEN: Well nourished, well developed, in no acute distress HEENT: normal Neck: no JVD, carotid bruits, or masses Cardiac: RRR; no murmurs, rubs, or gallops,no edema  Respiratory:  clear to auscultation bilaterally, normal  work of breathing GI: soft, nontender, nondistended, + BS MS: no deformity or atrophy Skin: warm and dry, no rash Neuro:  Strength and sensation are intact Psych: euthymic mood, full affect   EKG:  EKG was ordered today and showed NSR with PAC's and nonspecific ST abnormality    Recent Labs: 04/13/2014: Pro B Natriuretic peptide (BNP) 1376.0* 04/15/2014: Magnesium 2.3 05/03/2014: Hemoglobin 12.8*; Platelets 264.0 10/07/2014: BUN 17; Creatinine, Ser 1.00; Potassium 4.7; Sodium 140 01/27/2015: ALT 29    Lipid Panel    Component Value Date/Time   CHOL 107 01/27/2015 0923   TRIG 69.0 01/27/2015 0923   HDL 42.10 01/27/2015 0923   CHOLHDL 3 01/27/2015 0923   VLDL 13.8 01/27/2015 0923   LDLCALC 52 01/27/2015 0923      Wt Readings from Last 3 Encounters:  02/13/15 212 lb (96.163 kg)  10/07/14 208 lb 9.6 oz (94.62 kg)  08/06/14 207 lb 12.8 oz (94.257 kg)    ASSESSMENT AND PLAN:  1.ASCAD with remote NSTEMI in the setting of acute bronchitis with hypoxia.Cath showed occluded RCA with left to left collaterals and otherwise nonobstructive disease. Continue medical management with ASA, BB and low dose statin.  2. Tobacco abuse - he has quit and I congratulated him 3. Dyslipidemia - LDL at goal on low dose crestor/zetia  Current medicines are reviewed at length with the patient today.  The patient does not have concerns regarding medicines.  The following changes have been made:  no change  Labs/ tests ordered today: See above Assessment and Plan No orders of the defined types were placed in this encounter.     Disposition:   FU with me in 6 months  Signed, [name], MD  02/13/2015 1:16 PM    [facility] [place], [place], [place]  [address] Phone: [phone]; Fax: [phone]

## 2015-02-13 ENCOUNTER — Ambulatory Visit (INDEPENDENT_AMBULATORY_CARE_PROVIDER_SITE_OTHER): Payer: Commercial Managed Care - HMO | Admitting: Cardiology

## 2015-02-13 ENCOUNTER — Encounter: Payer: Self-pay | Admitting: Cardiology

## 2015-02-13 VITALS — BP 140/70 | HR 58 | Ht 66.0 in | Wt 212.0 lb

## 2015-02-13 DIAGNOSIS — I2510 Atherosclerotic heart disease of native coronary artery without angina pectoris: Secondary | ICD-10-CM | POA: Diagnosis not present

## 2015-02-13 DIAGNOSIS — R0602 Shortness of breath: Secondary | ICD-10-CM

## 2015-02-13 DIAGNOSIS — I2583 Coronary atherosclerosis due to lipid rich plaque: Principal | ICD-10-CM

## 2015-02-13 DIAGNOSIS — E785 Hyperlipidemia, unspecified: Secondary | ICD-10-CM

## 2015-02-13 DIAGNOSIS — Z87891 Personal history of nicotine dependence: Secondary | ICD-10-CM | POA: Diagnosis not present

## 2015-02-13 NOTE — Patient Instructions (Signed)

## 2015-04-04 ENCOUNTER — Ambulatory Visit (INDEPENDENT_AMBULATORY_CARE_PROVIDER_SITE_OTHER): Payer: Commercial Managed Care - HMO | Admitting: Family Medicine

## 2015-04-04 ENCOUNTER — Encounter: Payer: Self-pay | Admitting: Family Medicine

## 2015-04-04 VITALS — BP 128/69 | HR 61 | Temp 97.4°F | Ht 66.0 in | Wt 212.0 lb

## 2015-04-04 DIAGNOSIS — E785 Hyperlipidemia, unspecified: Secondary | ICD-10-CM | POA: Diagnosis not present

## 2015-04-04 DIAGNOSIS — I2510 Atherosclerotic heart disease of native coronary artery without angina pectoris: Secondary | ICD-10-CM

## 2015-04-04 DIAGNOSIS — L409 Psoriasis, unspecified: Secondary | ICD-10-CM | POA: Diagnosis not present

## 2015-04-04 DIAGNOSIS — Z23 Encounter for immunization: Secondary | ICD-10-CM | POA: Diagnosis not present

## 2015-04-04 DIAGNOSIS — I2583 Coronary atherosclerosis due to lipid rich plaque: Secondary | ICD-10-CM

## 2015-04-04 NOTE — Progress Notes (Signed)
Subjective:  Patient ID: Joseph Shields, male    DOB: 09/30/1945  Age: 69 y.o. MRN: [id number]  CC: Psoriasis and Hyperlipidemia   HPI Hrishikesh Hoeg Ditmore presents for follow-up of elevated cholesterol. Doing well without complaints on current medication. Denies side effects of statin including myalgia and arthralgia and nausea. Also in today for liver function testing. Currently no chest pain, shortness of breath or other cardiovascular related symptoms noted. Continues to take the methotrexate and folic acid for his psoriasis. It seems to be keeping it under good control. He is concerned about immune suppression and how that relates to taking alu or pneumonia shot. History Tagen has a past medical history of CAD (coronary artery disease); COPD (chronic obstructive pulmonary disease) ([place]); History of tobacco abuse; Hyperlipidemia; Morbid obesity ([place]); and Psoriasis.   He has past surgical history that includes Spine surgery and left heart catheterization with coronary angiogram (N/A, 05/06/2014).   His family history includes Allergies in his mother; Asthma in his mother; Heart disease in his father; Stroke in his mother.He reports that he quit smoking about a year ago. His smoking use included Cigarettes. He has a 24.5 pack-year smoking history. He has never used smokeless tobacco. He reports that he does not drink alcohol or use illicit drugs.  Current Outpatient Prescriptions on File Prior to Visit  Medication Sig Dispense Refill  . aspirin EC 81 MG tablet Take 81 mg by mouth daily.    . cetirizine (ZYRTEC) 10 MG tablet Take 10 mg by mouth as needed for allergies.     . Cholecalciferol 1000 UNITS capsule Take 1,000 Units by mouth daily.    [name] ezetimibe (ZETIA) 10 MG tablet Take 1 tablet (10 mg total) by mouth daily. 30 tablet 11  . folic acid (FOLVITE) 1 MG tablet Take 1 tablet (1 mg total) by mouth daily. 30 tablet 5  . methotrexate (RHEUMATREX) 2.5 MG tablet Caution:Chemotherapy. Protect  from light. Take 4 pills weekly 16 tablet 5  . metoprolol succinate (TOPROL-XL) 25 MG 24 hr tablet TAKE ONE TABLET BY MOUTH ONCE DAILY 30 tablet 5  . rosuvastatin (CRESTOR) 20 MG tablet Take 1 tablet (20 mg total) by mouth daily. (Patient taking differently: Take 10 mg by mouth daily. ) 30 tablet 6  . albuterol (PROVENTIL HFA;VENTOLIN HFA) 108 (90 BASE) MCG/ACT inhaler Inhale 2 puffs into the lungs every 6 (six) hours as needed for wheezing or shortness of breath. (Patient not taking: Reported on 04/04/2015) 1 Inhaler 2  . budesonide-formoterol (SYMBICORT) 160-4.5 MCG/ACT inhaler Take 2 puffs first thing in am and then another 2 puffs about 12 hours later. (Patient not taking: Reported on 04/04/2015) 1 Inhaler 11   No current facility-administered medications on file prior to visit.    ROS Review of Systems  Constitutional: Negative for fever, chills, diaphoresis and unexpected weight change.  HENT: Negative for congestion, hearing loss, rhinorrhea and sore throat.   Eyes: Negative for visual disturbance.  Respiratory: Negative for cough and shortness of breath.   Cardiovascular: Negative for chest pain.  Gastrointestinal: Negative for abdominal pain, diarrhea and constipation.  Genitourinary: Negative for dysuria and flank pain.  Musculoskeletal: Negative for joint swelling and arthralgias.  Skin: Negative for rash.  Neurological: Negative for dizziness and headaches.  Psychiatric/Behavioral: Negative for sleep disturbance and dysphoric mood.    Objective:  BP 128/69 mmHg  Pulse 61  Temp(Src) 97.4 F (36.3 C) (Oral)  Ht 5' 6"  (1.676 m)  Wt 212 lb (96.163 kg)  BMI 34.23 kg/m2  SpO2 94%  BP Readings from Last 3 Encounters:  04/04/15 128/69  02/13/15 140/70  10/07/14 132/76    Wt Readings from Last 3 Encounters:  04/04/15 212 lb (96.163 kg)  02/13/15 212 lb (96.163 kg)  10/07/14 208 lb 9.6 oz (94.62 kg)     Physical Exam  Constitutional: He is oriented to person, place,  and time. He appears well-developed and well-nourished. No distress.  HENT:  Head: Normocephalic and atraumatic.  Right Ear: External ear normal.  Left Ear: External ear normal.  Nose: Nose normal.  Mouth/Throat: Oropharynx is clear and moist.  Eyes: Conjunctivae and EOM are normal. Pupils are equal, round, and reactive to light.  Neck: Normal range of motion. Neck supple. No thyromegaly present.  Cardiovascular: Normal rate, regular rhythm and normal heart sounds.   No murmur heard. Pulmonary/Chest: Effort normal and breath sounds normal. No respiratory distress. He has no wheezes. He has no rales.  Abdominal: Soft. Bowel sounds are normal. He exhibits no distension. There is no tenderness.  Lymphadenopathy:    He has no cervical adenopathy.  Neurological: He is alert and oriented to person, place, and time. He has normal reflexes.  Skin: Skin is warm and dry.  Psychiatric: He has a normal mood and affect. His behavior is normal. Judgment and thought content normal.    Lab Results  Component Value Date   HGBA1C 6.0* 04/15/2014    Lab Results  Component Value Date   WBC 6.0 05/03/2014   HGB 12.8* 05/03/2014   HCT 39.1 05/03/2014   PLT 264.0 05/03/2014   GLUCOSE 92 10/07/2014   CHOL 107 01/27/2015   TRIG 69.0 01/27/2015   HDL 42.10 01/27/2015   LDLCALC 52 01/27/2015   ALT 29 01/27/2015   AST 25 10/07/2014   NA 140 10/07/2014   K 4.7 10/07/2014   CL 101 10/07/2014   CREATININE 1.00 10/07/2014   BUN 17 10/07/2014   CO2 22 10/07/2014   INR 1.0 05/03/2014   HGBA1C 6.0* 04/15/2014    No results found.  Assessment & Plan:   Cortlin was seen today for psoriasis and hyperlipidemia.  Diagnoses and all orders for this visit:  Psoriasis -     CBC with Differential/Platelet -     CMP14+EGFR  Hyperlipidemia -     CBC with Differential/Platelet -     CMP14+EGFR  Coronary artery disease due to lipid rich plaque -     CBC with Differential/Platelet -      CMP14+EGFR  Encounter for immunization  Other orders -     Flu vaccine HIGH DOSE PF   I am having Mr. Delisa maintain his cetirizine, Cholecalciferol, aspirin EC, albuterol, budesonide-formoterol, metoprolol succinate, rosuvastatin, folic acid, methotrexate, and ezetimibe.  No orders of the defined types were placed in this encounter.   holesterol levels reviewed with the patient they are excellent.  Follow-up: Return in about 6 months (around 10/03/2015) for CPE.  [name], M.D.

## 2015-04-04 NOTE — Addendum Note (Signed)
Addended by: Marin Olp on: 04/04/2015 01:40 PM   Modules accepted: Orders, SmartSet

## 2015-04-05 LAB — CBC WITH DIFFERENTIAL/PLATELET
BASOS ABS: 0 10*3/uL (ref 0.0–0.2)
BASOS: 0 %
EOS (ABSOLUTE): 0.3 10*3/uL (ref 0.0–0.4)
EOS: 5 %
HEMATOCRIT: 42.2 % (ref 37.5–51.0)
HEMOGLOBIN: 14.3 g/dL (ref 12.6–17.7)
Immature Grans (Abs): 0 10*3/uL (ref 0.0–0.1)
Immature Granulocytes: 0 %
LYMPHS ABS: 2 10*3/uL (ref 0.7–3.1)
Lymphs: 27 %
MCH: 32.5 pg (ref 26.6–33.0)
MCHC: 33.9 g/dL (ref 31.5–35.7)
MCV: 96 fL (ref 79–97)
MONOCYTES: 13 %
Monocytes Absolute: 1 10*3/uL — ABNORMAL HIGH (ref 0.1–0.9)
NEUTROS ABS: 4.2 10*3/uL (ref 1.4–7.0)
Neutrophils: 55 %
Platelets: 215 10*3/uL (ref 150–379)
RBC: 4.4 x10E6/uL (ref 4.14–5.80)
RDW: 13.9 % (ref 12.3–15.4)
WBC: 7.5 10*3/uL (ref 3.4–10.8)

## 2015-04-05 LAB — CMP14+EGFR
A/G RATIO: 2 (ref 1.1–2.5)
ALK PHOS: 52 IU/L (ref 39–117)
ALT: 25 IU/L (ref 0–44)
AST: 24 IU/L (ref 0–40)
Albumin: 4.5 g/dL (ref 3.6–4.8)
BILIRUBIN TOTAL: 0.4 mg/dL (ref 0.0–1.2)
BUN/Creatinine Ratio: 16 (ref 10–22)
BUN: 16 mg/dL (ref 8–27)
CO2: 22 mmol/L (ref 18–29)
Calcium: 9.2 mg/dL (ref 8.6–10.2)
Chloride: 101 mmol/L (ref 97–106)
Creatinine, Ser: 1.01 mg/dL (ref 0.76–1.27)
GFR calc Af Amer: 88 mL/min/{1.73_m2} (ref >59)
GFR calc non Af Amer: 76 mL/min/{1.73_m2} (ref >59)
GLOBULIN, TOTAL: 2.2 g/dL (ref 1.5–4.5)
Glucose: 99 mg/dL (ref 65–99)
POTASSIUM: 4.7 mmol/L (ref 3.5–5.2)
SODIUM: 142 mmol/L (ref 136–144)
Total Protein: 6.7 g/dL (ref 6.0–8.5)

## 2015-04-08 ENCOUNTER — Other Ambulatory Visit: Payer: Commercial Managed Care - HMO

## 2015-04-08 ENCOUNTER — Other Ambulatory Visit: Payer: Self-pay | Admitting: Family Medicine

## 2015-04-08 DIAGNOSIS — Z1212 Encounter for screening for malignant neoplasm of rectum: Secondary | ICD-10-CM | POA: Diagnosis not present

## 2015-04-08 MED ORDER — METHOTREXATE 2.5 MG PO TABS: ORAL_TABLET | ORAL | Status: DC

## 2015-04-08 MED ORDER — FOLIC ACID 1 MG PO TABS: 1.0000 mg | ORAL_TABLET | Freq: Every day | ORAL | Status: DC

## 2015-04-08 NOTE — Progress Notes (Signed)
lab only.

## 2015-04-11 LAB — FECAL OCCULT BLOOD, IMMUNOCHEMICAL: FECAL OCCULT BLD: NEGATIVE

## 2015-05-16 ENCOUNTER — Other Ambulatory Visit: Payer: Self-pay | Admitting: Cardiology

## 2015-06-25 ENCOUNTER — Other Ambulatory Visit: Payer: Self-pay

## 2015-06-25 MED ORDER — METHOTREXATE 2.5 MG PO TABS: ORAL_TABLET | ORAL | Status: DC

## 2015-06-25 MED ORDER — ROSUVASTATIN CALCIUM 20 MG PO TABS: 20.0000 mg | ORAL_TABLET | Freq: Every day | ORAL | Status: DC

## 2015-06-25 NOTE — Telephone Encounter (Signed)
Last seen 04/04/15  Dr Livia Snellen  This is for mail order

## 2015-07-08 ENCOUNTER — Other Ambulatory Visit: Payer: Self-pay

## 2015-07-08 MED ORDER — METOPROLOL SUCCINATE ER 25 MG PO TB24: 25.0000 mg | ORAL_TABLET | Freq: Every day | ORAL | Status: DC

## 2015-07-08 MED ORDER — ROSUVASTATIN CALCIUM 20 MG PO TABS: 20.0000 mg | ORAL_TABLET | Freq: Every day | ORAL | Status: DC

## 2015-07-10 ENCOUNTER — Other Ambulatory Visit: Payer: Self-pay | Admitting: *Deleted

## 2015-08-13 ENCOUNTER — Encounter: Payer: Self-pay | Admitting: Cardiology

## 2015-08-13 ENCOUNTER — Ambulatory Visit (INDEPENDENT_AMBULATORY_CARE_PROVIDER_SITE_OTHER): Payer: Commercial Managed Care - HMO | Admitting: Cardiology

## 2015-08-13 VITALS — BP 130/74 | HR 70 | Ht 68.0 in | Wt 209.1 lb

## 2015-08-13 DIAGNOSIS — I2583 Coronary atherosclerosis due to lipid rich plaque: Secondary | ICD-10-CM | POA: Diagnosis not present

## 2015-08-13 DIAGNOSIS — I2510 Atherosclerotic heart disease of native coronary artery without angina pectoris: Secondary | ICD-10-CM

## 2015-08-13 DIAGNOSIS — E785 Hyperlipidemia, unspecified: Secondary | ICD-10-CM

## 2015-08-13 LAB — LIPID PANEL
Cholesterol: 143 mg/dL (ref 125–200)
HDL: 42 mg/dL (ref >=40)
LDL CALC: 78 mg/dL (ref <130)
Total CHOL/HDL Ratio: 3.4 Ratio (ref <=5.0)
Triglycerides: 115 mg/dL (ref <150)
VLDL: 23 mg/dL (ref <30)

## 2015-08-13 LAB — HEPATIC FUNCTION PANEL
ALT: 31 U/L (ref 9–46)
AST: 29 U/L (ref 10–35)
Albumin: 4.4 g/dL (ref 3.6–5.1)
Alkaline Phosphatase: 49 U/L (ref 40–115)
BILIRUBIN DIRECT: 0.2 mg/dL (ref <=0.2)
BILIRUBIN INDIRECT: 0.7 mg/dL (ref 0.2–1.2)
BILIRUBIN TOTAL: 0.9 mg/dL (ref 0.2–1.2)
Total Protein: 6.9 g/dL (ref 6.1–8.1)

## 2015-08-13 NOTE — Patient Instructions (Signed)
Medication Instructions:  None  Labwork: Lipids and LFTs today  Testing/Procedures: None  Follow-Up: Your physician wants you to follow-up in: 1 year with Dr. Radford Pax. You will receive a reminder letter in the mail two months in advance. If you don't receive a letter, please call our office to schedule the follow-up appointment.   Any Other Special Instructions Will Be Listed Below (If Applicable).     If you need a refill on your cardiac medications before your next appointment, please call your pharmacy.

## 2015-08-13 NOTE — Progress Notes (Signed)
Cardiology Office Note    Date:  08/13/2015   ID:  Joseph Shields, DOB 02-02-1946, MRN [id number]  PCP:  [name], MD  Cardiologist:  [name], MD   Chief Complaint  Patient presents with  . Coronary Artery Disease  . Hypertension    History of Present Illness:  Joseph Shields is a 70 y.o. male with severe single vessel ASCAD with occluded proximal RCA with left to right collaterals and moderate disease of a small to moderate diagonal branch on medical management He was started on lipitor and stopped it due to muscle aches. He also stopped the coreg because he said it made his legs swell. He presents back today for followup. He has not had any further chest pain. He has chronic SOB from COPD which is stable and only occurs with exertion. He thinks the SOB has improved.  He has quit smoking. He denies any LE edema, dizziness, palpitations or syncope.     Past Medical History  Diagnosis Date  . CAD (coronary artery disease)     a. 03/2014 NSTEMI - initially refused cath;  b. 03/2014 Echo: EF 55-60%;  c. 04/2014 Cath: LM nl, LAD 20-30p, 65m, D1 30-40, D2 small, 60-70, LCX nl, RCA 100p (CTO), L->R collaterals, EF 55-60%-->Med Rx.  [name] COPD (chronic obstructive pulmonary disease) ([place])     a. On home O2.  [name] History of tobacco abuse     a. 40+ pack years, quit 03/2014.  [name] Hyperlipidemia   . Morbid obesity ([place])   . Psoriasis     Past Surgical History  Procedure Laterality Date  . Spine surgery    . Left heart catheterization with coronary angiogram N/A 05/06/2014    Procedure: LEFT HEART CATHETERIZATION WITH CORONARY ANGIOGRAM;  Surgeon: Leonie Man, MD;  Location[facility] CATH LAB;  Service: Cardiovascular;  Laterality: N/A;    Current Medications: Outpatient Prescriptions Prior to Visit  Medication Sig Dispense Refill  . aspirin EC 81 MG tablet Take 81 mg by mouth daily.    . folic acid (FOLVITE) 1 MG tablet Take 1 tablet (1 mg total) by mouth daily. 30 tablet 5   . metoprolol succinate (TOPROL-XL) 25 MG 24 hr tablet Take 1 tablet (25 mg total) by mouth daily. 90 tablet 0  . rosuvastatin (CRESTOR) 20 MG tablet Take 1 tablet (20 mg total) by mouth daily. 90 tablet 0  . methotrexate (RHEUMATREX) 2.5 MG tablet Caution:Chemotherapy. Protect from light. Take 4 pills weekly 48 tablet 0  . albuterol (PROVENTIL HFA;VENTOLIN HFA) 108 (90 BASE) MCG/ACT inhaler Inhale 2 puffs into the lungs every 6 (six) hours as needed for wheezing or shortness of breath. (Patient not taking: Reported on 04/04/2015) 1 Inhaler 2  . budesonide-formoterol (SYMBICORT) 160-4.5 MCG/ACT inhaler Take 2 puffs first thing in am and then another 2 puffs about 12 hours later. (Patient not taking: Reported on 04/04/2015) 1 Inhaler 11  . cetirizine (ZYRTEC) 10 MG tablet Take 10 mg by mouth as needed for allergies.     . Cholecalciferol 1000 UNITS capsule Take 1,000 Units by mouth daily.    [name] ezetimibe (ZETIA) 10 MG tablet Take 1 tablet (10 mg total) by mouth daily. 30 tablet 11   No facility-administered medications prior to visit.     Allergies:   Flonase and Penicillins   Social History   Social History  . Marital Status: Married    Spouse Name: N/A  . Number of Children: N/A  . Years of Education:  N/A   Social History Main Topics  . Smoking status: Former Smoker -- 0.50 packs/day for 49 years    Types: Cigarettes    Quit date: 04/07/2014  . Smokeless tobacco: Never Used  . Alcohol Use: No  . Drug Use: No  . Sexual Activity: Yes   Other Topics Concern  . None   Social History Narrative     Family History:  The patient's family history includes Allergies in his mother; Asthma in his mother; Heart disease in his father; Stroke in his mother.   ROS:   Please see the history of present illness.    Review of Systems  Constitution: Negative.  HENT: Negative.   Eyes: Negative.   Cardiovascular: Negative.   Respiratory: Negative.   Skin: Negative.   Musculoskeletal:  Negative.   Gastrointestinal: Negative.   Genitourinary: Negative.   Neurological: Negative.   Psychiatric/Behavioral: Negative.    All other systems reviewed and are negative.   PHYSICAL EXAM:   VS:  BP 130/74 mmHg  Pulse 70  Ht 5\' 8"  (1.727 m)  Wt 209 lb 1.9 oz (94.856 kg)  BMI 31.80 kg/m2   GEN: Well nourished, well developed, in no acute distress HEENT: normal Neck: no JVD, carotid bruits, or masses Cardiac: RRR; no murmurs, rubs, or gallops,no edema.  Intact distal pulses bilaterally.  Respiratory:  clear to auscultation bilaterally, normal work of breathing GI: soft, nontender, nondistended, + BS MS: no deformity or atrophy Skin: warm and dry, no rash Neuro:  Alert and Oriented x 3, Strength and sensation are intact Psych: euthymic mood, full affect  Wt Readings from Last 3 Encounters:  08/13/15 209 lb 1.9 oz (94.856 kg)  04/04/15 212 lb (96.163 kg)  02/13/15 212 lb (96.163 kg)      Studies/Labs Reviewed:   EKG:  EKG is ordered today.  The ekg ordered today demonstrates NSR at 70bpm with no ST changes  Recent Labs: 04/04/2015: ALT 25; BUN 16; Creatinine, Ser 1.01; Platelets 215; Potassium 4.7; Sodium 142   Lipid Panel    Component Value Date/Time   CHOL 107 01/27/2015 0923   TRIG 69.0 01/27/2015 0923   HDL 42.10 01/27/2015 0923   CHOLHDL 3 01/27/2015 0923   VLDL 13.8 01/27/2015 0923   LDLCALC 52 01/27/2015 0923    Additional studies/ records that were reviewed today include:  none    ASSESSMENT:    1. Coronary artery disease due to lipid rich plaque   2. Hyperlipidemia      PLAN:  In order of problems listed above:  1. ASCAD with occluded proximal RCA with left to right collaterals and moderate disease of a small to moderate diagonal branch on medical management. He has no angina.  Continue ASA/BB/statin.  2. Dyslipidemia - LDL goal < 70.  Continue statin.  Check FLP and ALT.    Followup with me in 1 year   Medication Adjustments/Labs and  Tests Ordered: Current medicines are reviewed at length with the patient today.  Concerns regarding medicines are outlined above.  Medication changes, Labs and Tests ordered today are listed in the Patient Instructions below. There are no Patient Instructions on file for this visit.   Lurena Nida, MD  08/13/2015 9:25 AM    [facility] [place], [place],   [address] Phone: [phone]; Fax: [phone]

## 2015-08-15 ENCOUNTER — Telehealth: Payer: Self-pay

## 2015-08-15 DIAGNOSIS — E785 Hyperlipidemia, unspecified: Secondary | ICD-10-CM

## 2015-08-15 NOTE — Telephone Encounter (Signed)
Informed patient's DPR of results and verbal understanding expressed.   She st he only increased to 20 mg last week. Scheduled FLP and ALT for June 5 to recheck on higher dose of Crestor. DPR agrees with treatment plan.

## 2015-08-15 NOTE — Telephone Encounter (Signed)
-----   Message from [name], MD sent at 08/14/2015  3:00 PM EDT ----- Increase Crestor to 40mg  daily and recheck FLp and ALT in 6 weeks

## 2015-09-25 ENCOUNTER — Other Ambulatory Visit: Payer: Self-pay | Admitting: Family Medicine

## 2015-09-29 ENCOUNTER — Other Ambulatory Visit (INDEPENDENT_AMBULATORY_CARE_PROVIDER_SITE_OTHER): Payer: Commercial Managed Care - HMO | Admitting: *Deleted

## 2015-09-29 DIAGNOSIS — E785 Hyperlipidemia, unspecified: Secondary | ICD-10-CM | POA: Diagnosis not present

## 2015-09-29 LAB — LIPID PANEL
CHOL/HDL RATIO: 2.6 ratio (ref <=5.0)
CHOLESTEROL: 127 mg/dL (ref 125–200)
HDL: 48 mg/dL (ref >=40)
LDL Cholesterol: 64 mg/dL (ref <130)
TRIGLYCERIDES: 77 mg/dL (ref <150)
VLDL: 15 mg/dL (ref <30)

## 2015-09-29 LAB — ALT: ALT: 19 U/L (ref 9–46)

## 2015-09-30 ENCOUNTER — Other Ambulatory Visit: Payer: Self-pay | Admitting: Cardiology

## 2015-10-09 ENCOUNTER — Other Ambulatory Visit: Payer: Self-pay | Admitting: Family Medicine

## 2015-10-15 ENCOUNTER — Ambulatory Visit (INDEPENDENT_AMBULATORY_CARE_PROVIDER_SITE_OTHER): Payer: Commercial Managed Care - HMO | Admitting: Family Medicine

## 2015-10-15 ENCOUNTER — Encounter: Payer: Self-pay | Admitting: Family Medicine

## 2015-10-15 VITALS — BP 139/81 | HR 58 | Temp 98.6°F | Ht 68.0 in | Wt 209.4 lb

## 2015-10-15 DIAGNOSIS — L409 Psoriasis, unspecified: Secondary | ICD-10-CM | POA: Diagnosis not present

## 2015-10-15 DIAGNOSIS — Z0000 Encounter for general adult medical examination without abnormal findings: Secondary | ICD-10-CM

## 2015-10-15 MED ORDER — METHOTREXATE 2.5 MG PO TABS: 10.0000 mg | ORAL_TABLET | ORAL | Status: DC

## 2015-10-15 MED ORDER — TRIAMCINOLONE ACETONIDE 0.1 % EX OINT: 1.0000 "application " | TOPICAL_OINTMENT | Freq: Two times a day (BID) | CUTANEOUS | Status: DC

## 2015-10-15 NOTE — Progress Notes (Signed)
   HPI  Patient presents today here for follow-up of psoriasis  He was started on methotrexate by dermatology. He states that he's been doing very well with methotrexate. He requests refill for 2 weeks to a local pharmacy, and 6 months worth 2 mail in pharmacy.  He denies any complaints today.  He's taking his cholesterol medicine like usual no muscle aches or pains, he does not watch his diet or exercise regularly. He has generally active.  He's recently been evaluated by cardiology, he is a chest pain today.  PMH: Smoking status noted ROS: Per HPI  Objective: BP 139/81 mmHg  Pulse 58  Temp(Src) 98.6 F (37 C) (Oral)  Ht 5\' 8"  (1.727 m)  Wt 209 lb 6.4 oz (94.983 kg)  BMI 31.85 kg/m2 Gen: NAD, alert, cooperative with exam HEENT: NCAT CV: RRR, good S1/S2, no murmur Resp: CTABL, no wheezes, non-labored Ext: No edema, warm Neuro: Alert and oriented, No gross deficits  Assessment and plan:  # Psoriasis Refill methotrexate Recent LFTs were normal. Rechecking CBC today     Orders Placed This Encounter  Procedures  . CBC with Differential    Meds ordered this encounter  Medications  . DISCONTD: triamcinolone ointment (KENALOG) 0.1 %    Sig: Apply 1 application topically 2 (two) times daily.  [name] DISCONTD: methotrexate (RHEUMATREX) 2.5 MG tablet    Sig: Take 4 tablets (10 mg total) by mouth once a week. Caution:Chemotherapy. Protect from light.    Dispense:  8 tablet    Refill:  0  . triamcinolone ointment (KENALOG) 0.1 %    Sig: Apply 1 application topically 2 (two) times daily.    Dispense:  453.6 g    Refill:  0  . methotrexate (RHEUMATREX) 2.5 MG tablet    Sig: Take 4 tablets (10 mg total) by mouth once a week. Caution:Chemotherapy. Protect from light.    Dispense:  48 tablet    Refill:  [place], MD [facility] 10/15/2015, 3:32 PM

## 2015-10-15 NOTE — Patient Instructions (Signed)
Great to meet you!  I have sent 6 months refills for you, please come back for more lood work then.

## 2015-10-16 LAB — CBC WITH DIFFERENTIAL/PLATELET
BASOS ABS: 0 10*3/uL (ref 0.0–0.2)
Basos: 1 %
EOS (ABSOLUTE): 0.4 10*3/uL (ref 0.0–0.4)
EOS: 6 %
HEMATOCRIT: 42.3 % (ref 37.5–51.0)
HEMOGLOBIN: 13.6 g/dL (ref 12.6–17.7)
IMMATURE GRANULOCYTES: 0 %
Immature Grans (Abs): 0 10*3/uL (ref 0.0–0.1)
Lymphocytes Absolute: 1.8 10*3/uL (ref 0.7–3.1)
Lymphs: 28 %
MCH: 31.4 pg (ref 26.6–33.0)
MCHC: 32.2 g/dL (ref 31.5–35.7)
MCV: 98 fL — ABNORMAL HIGH (ref 79–97)
MONOCYTES: 13 %
MONOS ABS: 0.8 10*3/uL (ref 0.1–0.9)
NEUTROS PCT: 52 %
Neutrophils Absolute: 3.3 10*3/uL (ref 1.4–7.0)
Platelets: 192 10*3/uL (ref 150–379)
RBC: 4.33 x10E6/uL (ref 4.14–5.80)
RDW: 14 % (ref 12.3–15.4)
WBC: 6.2 10*3/uL (ref 3.4–10.8)

## 2015-10-16 LAB — HEPATITIS C ANTIBODY

## 2015-10-30 ENCOUNTER — Other Ambulatory Visit: Payer: Self-pay | Admitting: Cardiology

## 2015-11-03 ENCOUNTER — Encounter: Payer: Self-pay | Admitting: Family Medicine

## 2015-11-03 ENCOUNTER — Ambulatory Visit (INDEPENDENT_AMBULATORY_CARE_PROVIDER_SITE_OTHER): Payer: Commercial Managed Care - HMO | Admitting: Family Medicine

## 2015-11-03 VITALS — BP 136/84 | HR 73 | Temp 98.7°F | Ht 68.0 in | Wt 205.4 lb

## 2015-11-03 DIAGNOSIS — R1030 Lower abdominal pain, unspecified: Secondary | ICD-10-CM | POA: Diagnosis not present

## 2015-11-03 DIAGNOSIS — N50811 Right testicular pain: Secondary | ICD-10-CM | POA: Diagnosis not present

## 2015-11-03 LAB — URINALYSIS
BILIRUBIN UA: NEGATIVE
Glucose, UA: NEGATIVE
Leukocytes, UA: NEGATIVE
Nitrite, UA: NEGATIVE
Protein, UA: NEGATIVE
SPEC GRAV UA: 1.02 (ref 1.005–1.030)
UUROB: 0.2 mg/dL (ref 0.2–1.0)
pH, UA: 5.5 (ref 5.0–7.5)

## 2015-11-03 MED ORDER — CEFTRIAXONE SODIUM 1 G IJ SOLR
250.0000 mg | INTRAMUSCULAR | Status: AC
  Administered 2015-11-03: 250 mg via INTRAMUSCULAR

## 2015-11-03 MED ORDER — DOXYCYCLINE MONOHYDRATE 100 MG PO TABS: 100.0000 mg | ORAL_TABLET | Freq: Two times a day (BID) | ORAL | Status: DC

## 2015-11-03 NOTE — Progress Notes (Signed)
BP 136/84 mmHg  Pulse 73  Temp(Src) 98.7 F (37.1 C) (Oral)  Ht 5\' 8"  (1.727 m)  Wt 205 lb 6.4 oz (93.169 kg)  BMI 31.24 kg/m2   Subjective:    Patient ID: Joseph Shields, male    DOB: 05-25-1945, 70 y.o.   MRN: [id number]  HPI: Joseph Shields is a 70 y.o. male presenting on 11/03/2015 for Fever; Abdominal Pain; and enlarged testicle   HPI Abdominal pain and right testicular pain and swelling Patient comes in today because he's been having lower abdominal pain and right testicular pain and swelling in that right testicle is been going on for the past 4 days. He also has had some low-grade fevers at home as well. His temperature here today is 98.7. He says the pain goes in a bandlike part of his abdomen in the lower abdomen but the pain is worst in his right testicle where he is having the swelling. He denies any dysuria or constipation or diarrhea or blood in his urine or stool.  Relevant past medical, surgical, family and social history reviewed and updated as indicated. Interim medical history since our last visit reviewed. Allergies and medications reviewed and updated.  Review of Systems  Constitutional: Negative for fever.  HENT: Negative for ear discharge and ear pain.   Eyes: Negative for discharge and visual disturbance.  Respiratory: Negative for shortness of breath and wheezing.   Cardiovascular: Negative for chest pain and leg swelling.  Gastrointestinal: Positive for abdominal pain. Negative for nausea, vomiting, diarrhea and constipation.  Genitourinary: Positive for scrotal swelling. Negative for dysuria, frequency, discharge and difficulty urinating.  Musculoskeletal: Negative for back pain and gait problem.  Skin: Negative for rash.  Neurological: Negative for syncope, light-headedness and headaches.  All other systems reviewed and are negative.   Per HPI unless specifically indicated above     Medication List       This list is accurate as of: 11/03/15   6:24 PM.  Always use your most recent med list.               aspirin EC 81 MG tablet  Take 81 mg by mouth daily.     doxycycline 100 MG tablet  Commonly known as:  ADOXA  Take 1 tablet (100 mg total) by mouth 2 (two) times daily.     folic acid 1 MG tablet  Commonly known as:  FOLVITE  TAKE ONE TABLET BY MOUTH ONCE DAILY     methotrexate 2.5 MG tablet  Commonly known as:  RHEUMATREX  Take 4 tablets (10 mg total) by mouth once a week. Caution:Chemotherapy. Protect from light.     metoprolol succinate 25 MG 24 hr tablet  Commonly known as:  TOPROL-XL  TAKE 1 TABLET EVERY DAY     rosuvastatin 20 MG tablet  Commonly known as:  CRESTOR  TAKE 1 TABLET EVERY DAY     triamcinolone ointment 0.1 %  Commonly known as:  KENALOG  Apply 1 application topically 2 (two) times daily.           Objective:    BP 136/84 mmHg  Pulse 73  Temp(Src) 98.7 F (37.1 C) (Oral)  Ht 5\' 8"  (1.727 m)  Wt 205 lb 6.4 oz (93.169 kg)  BMI 31.24 kg/m2  Wt Readings from Last 3 Encounters:  11/03/15 205 lb 6.4 oz (93.169 kg)  10/15/15 209 lb 6.4 oz (94.983 kg)  08/13/15 209 lb 1.9 oz (94.856 kg)  Physical Exam  Constitutional: He is oriented to person, place, and time. He appears well-developed and well-nourished. No distress.  Eyes: Conjunctivae and EOM are normal. Pupils are equal, round, and reactive to light. Right eye exhibits no discharge. No scleral icterus.  Neck: Neck supple. No thyromegaly present.  Cardiovascular: Normal rate, regular rhythm, normal heart sounds and intact distal pulses.   No murmur heard. Pulmonary/Chest: Effort normal and breath sounds normal. No respiratory distress. He has no wheezes.  Abdominal: Soft. Bowel sounds are normal. He exhibits no distension. There is tenderness (Mild tenderness in the abdomen but more of the pain seems to be coming from the scrotum). Hernia confirmed negative in the right inguinal area and confirmed negative in the left inguinal area.   Genitourinary: Right testis shows swelling and tenderness. Right testis shows no mass. Left testis shows no mass, no swelling and no tenderness.  Musculoskeletal: Normal range of motion. He exhibits no edema.  Lymphadenopathy:    He has no cervical adenopathy.       Right: No inguinal adenopathy present.       Left: No inguinal adenopathy present.  Neurological: He is alert and oriented to person, place, and time. Coordination normal.  Skin: Skin is warm and dry. No rash noted. He is not diaphoretic.  Psychiatric: He has a normal mood and affect. His behavior is normal.  Nursing note and vitals reviewed.      Assessment & Plan:   Problem List Items Addressed This Visit    None    Visit Diagnoses    Lower abdominal pain    -  Primary    Relevant Medications    cefTRIAXone (ROCEPHIN) injection 250 mg (Completed)    doxycycline (ADOXA) 100 MG tablet    Other Relevant Orders    Urinalysis (Completed)    Urine culture (Completed)    US Scrotum    Right testicular pain        Concern for epididymitis, will treat with antibiotics, will send for ultrasound because of swelling and enlarged testes    Relevant Medications    cefTRIAXone (ROCEPHIN) injection 250 mg (Completed)    doxycycline (ADOXA) 100 MG tablet    Other Relevant Orders    US Scrotum        Follow up plan: Return if symptoms worsen or fail to improve.  Counseling provided for all of the vaccine components Orders Placed This Encounter  Procedures  . Urine culture  . US Scrotum  . Urinalysis    Caryl Pina, MD [facility] 11/03/2015, 6:24 PM

## 2015-11-04 LAB — URINE CULTURE: ORGANISM ID, BACTERIA: NO GROWTH

## 2015-11-05 ENCOUNTER — Emergency Department (HOSPITAL_COMMUNITY)
Admission: EM | Admit: 2015-11-05 | Discharge: 2015-11-05 | Disposition: A | Discharge status: Home / Self Care | Payer: Commercial Managed Care - HMO | Attending: Emergency Medicine | Admitting: Emergency Medicine

## 2015-11-05 ENCOUNTER — Encounter (HOSPITAL_COMMUNITY): Payer: Self-pay | Admitting: Nurse Practitioner

## 2015-11-05 ENCOUNTER — Emergency Department (HOSPITAL_COMMUNITY): Payer: Commercial Managed Care - HMO

## 2015-11-05 ENCOUNTER — Other Ambulatory Visit: Payer: Self-pay

## 2015-11-05 DIAGNOSIS — N5082 Scrotal pain: Secondary | ICD-10-CM

## 2015-11-05 DIAGNOSIS — N453 Epididymo-orchitis: Secondary | ICD-10-CM | POA: Diagnosis not present

## 2015-11-05 DIAGNOSIS — N4400 Torsion of testis, unspecified: Secondary | ICD-10-CM

## 2015-11-05 DIAGNOSIS — Z87891 Personal history of nicotine dependence: Secondary | ICD-10-CM | POA: Diagnosis not present

## 2015-11-05 DIAGNOSIS — I252 Old myocardial infarction: Secondary | ICD-10-CM | POA: Diagnosis not present

## 2015-11-05 DIAGNOSIS — J449 Chronic obstructive pulmonary disease, unspecified: Secondary | ICD-10-CM | POA: Insufficient documentation

## 2015-11-05 DIAGNOSIS — Z7982 Long term (current) use of aspirin: Secondary | ICD-10-CM | POA: Insufficient documentation

## 2015-11-05 DIAGNOSIS — N50811 Right testicular pain: Secondary | ICD-10-CM | POA: Diagnosis not present

## 2015-11-05 DIAGNOSIS — N451 Epididymitis: Secondary | ICD-10-CM | POA: Diagnosis not present

## 2015-11-05 LAB — I-STAT CG4 LACTIC ACID, ED
Lactic Acid, Venous: 1.15 mmol/L (ref 0.5–1.9)
Lactic Acid, Venous: 0.75 mmol/L (ref 0.5–1.9)

## 2015-11-05 LAB — COMPREHENSIVE METABOLIC PANEL
ALT: 37 U/L (ref 17–63)
AST: 48 U/L — ABNORMAL HIGH (ref 15–41)
Albumin: 3.7 g/dL (ref 3.5–5.0)
Alkaline Phosphatase: 40 U/L (ref 38–126)
Anion gap: 9 (ref 5–15)
BUN: 22 mg/dL — ABNORMAL HIGH (ref 6–20)
CO2: 21 mmol/L — ABNORMAL LOW (ref 22–32)
Calcium: 8.9 mg/dL (ref 8.9–10.3)
Chloride: 104 mmol/L (ref 101–111)
Creatinine, Ser: 1.28 mg/dL — ABNORMAL HIGH (ref 0.61–1.24)
GFR calc Af Amer: >60 mL/min (ref >60)
GFR calc non Af Amer: 55 mL/min — ABNORMAL LOW (ref >60)
Glucose, Bld: 121 mg/dL — ABNORMAL HIGH (ref 65–99)
Potassium: 4.2 mmol/L (ref 3.5–5.1)
Sodium: 134 mmol/L — ABNORMAL LOW (ref 135–145)
Total Bilirubin: 0.9 mg/dL (ref 0.3–1.2)
Total Protein: 7.1 g/dL (ref 6.5–8.1)

## 2015-11-05 LAB — CBC WITH DIFFERENTIAL/PLATELET
Basophils Absolute: 0 10*3/uL (ref 0.0–0.1)
Basophils Relative: 0 %
Eosinophils Absolute: 0 10*3/uL (ref 0.0–0.7)
Eosinophils Relative: 0 %
HCT: 41.2 % (ref 39.0–52.0)
Hemoglobin: 13.6 g/dL (ref 13.0–17.0)
Lymphocytes Relative: 15 %
Lymphs Abs: 1.5 10*3/uL (ref 0.7–4.0)
MCH: 31.3 pg (ref 26.0–34.0)
MCHC: 33 g/dL (ref 30.0–36.0)
MCV: 94.7 fL (ref 78.0–100.0)
Monocytes Absolute: 1.8 10*3/uL — ABNORMAL HIGH (ref 0.1–1.0)
Monocytes Relative: 18 %
Neutro Abs: 6.7 10*3/uL (ref 1.7–7.7)
Neutrophils Relative %: 67 %
Platelets: 195 10*3/uL (ref 150–400)
RBC: 4.35 MIL/uL (ref 4.22–5.81)
RDW: 13.7 % (ref 11.5–15.5)
WBC: 10 10*3/uL (ref 4.0–10.5)

## 2015-11-05 LAB — URINALYSIS, ROUTINE W REFLEX MICROSCOPIC
Glucose, UA: NEGATIVE mg/dL
Hgb urine dipstick: NEGATIVE
Ketones, ur: 15 mg/dL — AB
Leukocytes, UA: NEGATIVE
Nitrite: NEGATIVE
Protein, ur: 30 mg/dL — AB
Specific Gravity, Urine: 1.04 — ABNORMAL HIGH (ref 1.005–1.030)
pH: 5.5 (ref 5.0–8.0)

## 2015-11-05 LAB — URINE MICROSCOPIC-ADD ON
RBC / HPF: NONE SEEN RBC/hpf (ref 0–5)
WBC UA: NONE SEEN WBC/hpf (ref 0–5)

## 2015-11-05 MED ORDER — OXYCODONE-ACETAMINOPHEN 5-325 MG PO TABS: 1.0000 | ORAL_TABLET | ORAL | Status: DC | PRN

## 2015-11-05 MED ORDER — LEVOFLOXACIN 500 MG PO TABS
500.0000 mg | ORAL_TABLET | Freq: Once | ORAL | Status: AC
  Administered 2015-11-05: 500 mg via ORAL
  Filled 2015-11-05: qty 1

## 2015-11-05 MED ORDER — HYDROMORPHONE HCL 1 MG/ML IJ SOLN
1.0000 mg | Freq: Once | INTRAMUSCULAR | Status: AC
  Administered 2015-11-05: 1 mg via INTRAVENOUS
  Filled 2015-11-05: qty 1

## 2015-11-05 MED ORDER — ACETAMINOPHEN 325 MG PO TABS
ORAL_TABLET | ORAL | Status: AC
  Filled 2015-11-05: qty 2

## 2015-11-05 MED ORDER — CEFTRIAXONE SODIUM 250 MG IJ SOLR
250.0000 mg | Freq: Once | INTRAMUSCULAR | Status: AC
  Administered 2015-11-05: 250 mg via INTRAMUSCULAR
  Filled 2015-11-05: qty 250

## 2015-11-05 MED ORDER — LEVOFLOXACIN 500 MG PO TABS: 500.0000 mg | ORAL_TABLET | Freq: Every day | ORAL | Status: DC

## 2015-11-05 MED ORDER — ACETAMINOPHEN 325 MG PO TABS
650.0000 mg | ORAL_TABLET | Freq: Once | ORAL | Status: AC | PRN
  Administered 2015-11-05: 650 mg via ORAL

## 2015-11-05 MED ORDER — SODIUM CHLORIDE 0.9 % IV BOLUS (SEPSIS)
1000.0000 mL | Freq: Once | INTRAVENOUS | Status: AC
  Administered 2015-11-05: 1000 mL via INTRAVENOUS

## 2015-11-05 NOTE — ED Notes (Signed)
He c/o 5 day history of testicular pain and swelling, fevers. He went to PCP Monday and was started on abx but symptoms persist. denies bowel/bladder changes, n/v.

## 2015-11-05 NOTE — ED Provider Notes (Signed)
CSN: [id number]     Arrival date & time 11/05/15  1143 History  By signing my name below, I, Joseph Shields, attest that this documentation has been prepared under the direction and in the presence of Joseph Manifold, MD . Electronically Signed: Evelene Shields, Scribe. 11/05/2015. 1:02 PM.   Chief Complaint  Patient presents with  . Fever  . Testicle Pain    The history is provided by the patient. No language interpreter was used.     HPI Comments:  Joseph Shields is a 70 y.o. male who presents to the Emergency Department complaining of testicular pain x 4-5 days.  Pt saw PCP 3 days ago for his symptom and was started on antibiotic which he has been taking x 2 days without relief. Pt reports associated abdominal pain and fever. He denies urinary symptoms, nausea, and rectal pain. No alleviating factors noted.   Past Medical History  Diagnosis Date  . CAD (coronary artery disease)     a. 03/2014 NSTEMI - initially refused cath;  b. 03/2014 Echo: EF 55-60%;  c. 04/2014 Cath: LM nl, LAD 20-30p, 74m, D1 30-40, D2 small, 60-70, LCX nl, RCA 100p (CTO), L->R collaterals, EF 55-60%-->Med Rx.  [name] COPD (chronic obstructive pulmonary disease) ([place])     a. On home O2.  [name] History of tobacco abuse     a. 40+ pack years, quit 03/2014.  [name] Hyperlipidemia   . Morbid obesity ([place])   . Psoriasis   . Myocardial infarction [facility])    Past Surgical History  Procedure Laterality Date  . Spine surgery    . Left heart catheterization with coronary angiogram N/A 05/06/2014    Procedure: LEFT HEART CATHETERIZATION WITH CORONARY ANGIOGRAM;  Surgeon: Leonie Man, MD;  Location: [facility] CATH LAB;  Service: Cardiovascular;  Laterality: N/A;   Family History  Problem Relation Age of Onset  . Stroke Mother   . Heart disease Father   . Allergies Mother   . Asthma Mother    Social History  Substance Use Topics  . Smoking status: Former Smoker -- 0.50 packs/day for 49 years    Types: Cigarettes    Quit date:  04/07/2014  . Smokeless tobacco: Never Used  . Alcohol Use: No    Review of Systems  Constitutional: Positive for fever.  Gastrointestinal: Positive for abdominal pain. Negative for nausea and rectal pain.  Genitourinary: Positive for testicular pain. Negative for dysuria, frequency and hematuria.  All other systems reviewed and are negative.     Allergies  Flonase and Penicillins  Home Medications   Prior to Admission medications   Medication Sig Start Date End Date Taking? Authorizing Provider  aspirin EC 81 MG tablet Take 81 mg by mouth daily.    Historical Provider, MD  doxycycline (ADOXA) 100 MG tablet Take 1 tablet (100 mg total) by mouth 2 (two) times daily. 11/03/15   Fransisca Kaufmann Dettinger, MD  folic acid (FOLVITE) 1 MG tablet TAKE ONE TABLET BY MOUTH ONCE DAILY 10/09/15   [name], MD  levofloxacin (LEVAQUIN) 500 MG tablet Take 1 tablet (500 mg total) by mouth daily. 11/05/15   Joseph Manifold, MD  methotrexate (RHEUMATREX) 2.5 MG tablet Take 4 tablets (10 mg total) by mouth once a week. Caution:Chemotherapy. Protect from light. 10/15/15   [name], MD  metoprolol succinate (TOPROL-XL) 25 MG 24 hr tablet TAKE 1 TABLET EVERY DAY 09/30/15   [name], MD  oxyCODONE-acetaminophen (PERCOCET/ROXICET) 5-325 MG tablet Take 1 tablet by  mouth every 4 (four) hours as needed for severe pain. 11/05/15   Joseph Manifold, MD  rosuvastatin (CRESTOR) 20 MG tablet TAKE 1 TABLET EVERY DAY 10/30/15   [name], MD  triamcinolone ointment (KENALOG) 0.1 % Apply 1 application topically 2 (two) times daily. 10/15/15   [name], MD   BP 111/63 mmHg  Pulse 66  Temp(Src) 98.6 F (37 C) (Oral)  Resp 19  SpO2 91% Physical Exam  Constitutional: He is oriented to person, place, and time. He appears well-developed and well-nourished.  HENT:  Head: Normocephalic and atraumatic.  Eyes: EOM are normal.  Neck: Normal range of motion.  Cardiovascular: Normal rate, regular rhythm,  normal heart sounds and intact distal pulses.   Pulmonary/Chest: Effort normal and breath sounds normal. No respiratory distress.  Abdominal: Soft. He exhibits no distension. There is no tenderness.  Genitourinary:  Right testical enlarged. Mild erythema of scrotum.No skin thickening. No hernia. No perineal rash   Chaperone (scribe) was present for exam which was performed with no discomfort or complications.  Musculoskeletal: Normal range of motion.  Neurological: He is alert and oriented to person, place, and time.  Skin: Skin is warm and dry.  Psychiatric: He has a normal mood and affect. Judgment normal.  Nursing note and vitals reviewed.   ED Course  Procedures  DIAGNOSTIC STUDIES:  Oxygen Saturation is 94% on RA, adequate by my interpretation.    COORDINATION OF CARE:  1:00 PM Discussed treatment plan with pt at bedside and pt agreed to plan.  Labs Review Labs Reviewed  COMPREHENSIVE METABOLIC PANEL - Abnormal; Notable for the following:    Sodium 134 (*)    CO2 21 (*)    Glucose, Bld 121 (*)    BUN 22 (*)    Creatinine, Ser 1.28 (*)    AST 48 (*)    GFR calc non Af Amer 55 (*)    All other components within normal limits  CBC WITH DIFFERENTIAL/PLATELET - Abnormal; Notable for the following:    Monocytes Absolute 1.8 (*)    All other components within normal limits  URINE CULTURE  URINALYSIS, ROUTINE W REFLEX MICROSCOPIC (NOT AT [facility])  I-STAT CG4 LACTIC ACID, ED  I-STAT CG4 LACTIC ACID, ED  GC/CHLAMYDIA PROBE AMP ([place]) NOT AT [facility]    Imaging Review US Scrotum  11/05/2015  CLINICAL DATA:  RIGHT testicular pain and swelling for 3 days, fever for 6 days EXAM: SCROTAL ULTRASOUND DOPPLER ULTRASOUND OF THE TESTICLES TECHNIQUE: Complete ultrasound examination of the testicles, epididymis, and other scrotal structures was performed. Color and spectral Doppler ultrasound were also utilized to evaluate blood flow to the testicles. COMPARISON:  None FINDINGS: Right  testicle Measurements: 6.0 x 2.8 x 4.8 cm. Asymmetric enlargement versus LEFT testis. Heterogeneous mildly decreased parenchymal echogenicity without discrete mass or calcification. Prominent internal blood flow on color Doppler imaging. Left testicle Measurements: 4.4 x 1.9 x 3.9 cm. Normal morphology without mass or calcification. Internal blood flow present on color Doppler imaging. Right epididymis: Normal morphology without mass. Hypervascular on color Doppler imaging. Left epididymis:  Normal in size and appearance. Hydrocele:  Small BILATERAL hydroceles Varicocele: Significant hypervascularity of vessels in RIGHT spermatic cord secondary to hyperemia. Pulsed Doppler interrogation of both testes demonstrates normal low resistance arterial and venous waveforms bilaterally. IMPRESSION: No evidence of testicular mass or torsion. Enlarged heterogeneous RIGHT testis which is markedly hypervascular on color Doppler imaging and associated with epididymal hypervascularity consistent with acute RIGHT epididymo-orchitis. Electronically Signed  By: Lavonia Dana M.D.   On: 11/05/2015 14:19   [place] Art/ven Flow Abd Pelv Doppler  11/05/2015  CLINICAL DATA:  RIGHT testicular pain and swelling for 3 days, fever for 6 days EXAM: SCROTAL ULTRASOUND DOPPLER ULTRASOUND OF THE TESTICLES TECHNIQUE: Complete ultrasound examination of the testicles, epididymis, and other scrotal structures was performed. Color and spectral Doppler ultrasound were also utilized to evaluate blood flow to the testicles. COMPARISON:  None FINDINGS: Right testicle Measurements: 6.0 x 2.8 x 4.8 cm. Asymmetric enlargement versus LEFT testis. Heterogeneous mildly decreased parenchymal echogenicity without discrete mass or calcification. Prominent internal blood flow on color Doppler imaging. Left testicle Measurements: 4.4 x 1.9 x 3.9 cm. Normal morphology without mass or calcification. Internal blood flow present on color Doppler imaging. Right epididymis:  Normal morphology without mass. Hypervascular on color Doppler imaging. Left epididymis:  Normal in size and appearance. Hydrocele:  Small BILATERAL hydroceles Varicocele: Significant hypervascularity of vessels in RIGHT spermatic cord secondary to hyperemia. Pulsed Doppler interrogation of both testes demonstrates normal low resistance arterial and venous waveforms bilaterally. IMPRESSION: No evidence of testicular mass or torsion. Enlarged heterogeneous RIGHT testis which is markedly hypervascular on color Doppler imaging and associated with epididymal hypervascularity consistent with acute RIGHT epididymo-orchitis. Electronically Signed   By: Lavonia Dana M.D.   On: 11/05/2015 14:19   I have personally reviewed and evaluated these images and lab results as part of my medical decision-making.   EKG Interpretation None      MDM   Final diagnoses:  Epididymo-orchitis, acute    70 year old male with right epididymo-rchitis. He is nontoxic. No evidence of Fournier's. I feel he is appropriate for outpatient treatment. Urology follow-up. Emergent return precautions were discussed.  I personally preformed the services scribed in my presence. The recorded information has been reviewed is accurate. Joseph Manifold, MD.    Joseph Manifold, MD 11/08/15 [phone]

## 2015-11-05 NOTE — Discharge Instructions (Signed)
Orchitis Orchitis is swelling (inflammation) of a testicle caused by infection. Testicles are the male organs that produce sperm. The testicles are held in a fleshy sac (scrotum) located behind the penis. Orchitis usually affects only one testicle, but it can occur in both. The condition can develop suddenly. Orchitis can be caused by many different kinds of bacteria and viruses. CAUSES Orchitis can be caused by either a bacterial or viral infection. Bacterial Infections  These often occur along with an infection of the coiled tube that collects sperm and sits on top of the testicle (epididymis).  In men who are not sexually active, bacterial orchitis usually starts as a urinary tract infection and spreads to the testicle.  In sexually active men, sexually transmitted infections are the most common cause of bacterial orchitis. These can include:  Gonorrhea.  Chlamydia. Viral Infections  Mumps is still the most common cause of viral orchitis, though mumps is now rare in many areas because of vaccination.  Other viruses that can cause orchitis include:  The chickenpox virus (varicella-zoster virus).  The virus that causes mononucleosis (Epstein-Barr virus). RISK FACTORS Boys and men who have not been vaccinated against mumps are at risk for mumps orchitis. Risk factors for bacterial orchitis include:  Frequent urinary tract infections.  High-risk sexual behaviors.  Having a sexual partner with a sexually transmitted infection.  Having had urinary tract surgery.  Using a tube passed through the penis to drain urine (Foley catheter).  An enlarged prostate gland. SIGNS AND SYMPTOMS The most common symptoms of orchitis are swelling and pain in the scrotum. Other signs and symptoms may include:  Feeling generally sick (malaise).  Fever and chills.  Painful urination.  Painful ejaculation.  Blood or discharge from the  penis.  Nausea.  Headache.  Fatigue. DIAGNOSIS Your health care provider may suspect orchitis if you have a painful, swollen testicle along with other signs and symptoms of the condition. A physical exam will be done. Tests may also be done to help your health care provider make a diagnosis. These may include:  A blood test to check for signs of infection.  A urine test to check for a urinary tract infection.  Using a swab to collect a fluid sample from the tip of the penis to test for sexually transmitted infections.  Taking an image of the testicle using sound waves and a computer (testicular ultrasound). TREATMENT Treatment of orchitis depends on the cause. For orchitis caused by a bacterial infection, your health care provider will most likely prescribe antibiotic medicines. Bacterial infections usually clear up within a few days. Both viral infections and bacterial infections may be treated with:  Bed rest.  Anti-inflammatory medicines.  Pain medicines.  Elevating the scrotum and applying ice. HOME CARE INSTRUCTIONS  Rest as directed by your health care provider.  Take medicines only as directed by your health care provider.  If you were prescribed an antibiotic medicine, finish it all even if you start to feel better.  Elevate your scrotum and apply ice as directed:  Put ice in a plastic bag.  Place a small towel or pillow between your legs.  Rest your scrotum on the pillow or towel.  Place another towel between your skin and the plastic bag.  Leave the ice on for 20 minutes, 2-3 times a day. SEEK MEDICAL CARE IF:  You have a fever.  Pain and swelling have not gotten better after 3 days. SEEK IMMEDIATE MEDICAL CARE IF:  Your pain is getting  worse.  The swelling in your testicle gets worse.   This information is not intended to replace advice given to you by your health care provider. Make sure you discuss any questions you have with your health care  provider.   Document Released: 04/09/2000 Document Revised: 05/03/2014 Document Reviewed: 08/30/2013 Elsevier Interactive Patient Education [employer].

## 2015-11-05 NOTE — ED Notes (Signed)
Patient transported to Ultrasound 

## 2015-11-06 LAB — GC/CHLAMYDIA PROBE AMP (~~LOC~~) NOT AT ARMC
Chlamydia: NEGATIVE
Neisseria Gonorrhea: NEGATIVE

## 2015-11-06 LAB — URINE CULTURE: Culture: NO GROWTH

## 2015-11-19 ENCOUNTER — Other Ambulatory Visit: Payer: Self-pay | Admitting: Family Medicine

## 2016-02-23 ENCOUNTER — Telehealth: Payer: Self-pay | Admitting: Internal Medicine

## 2016-02-23 NOTE — Telephone Encounter (Signed)
Called spoke with patient who reports increased cough and congestion with white mucus "that looks like cotton" x2 weeks.  Pt denies any wheezing, tightness, increased SOB, hemoptysis, f/c/s, chest pain.  Pt requesting to be seen for assistance on "drying the stuff up."  MW with no openings Appt scheduled with TP 11.1.17 @ 1015 Pt aware to contact the office if symptoms worsen for sooner appt Nothing further needed at this time; will sign off

## 2016-02-25 ENCOUNTER — Ambulatory Visit (INDEPENDENT_AMBULATORY_CARE_PROVIDER_SITE_OTHER)
Admission: RE | Admit: 2016-02-25 | Discharge: 2016-02-25 | Disposition: A | Discharge status: Home / Self Care | Payer: Commercial Managed Care - HMO | Source: Ambulatory Visit | Attending: Adult Health | Admitting: Adult Health

## 2016-02-25 ENCOUNTER — Encounter: Payer: Self-pay | Admitting: Adult Health

## 2016-02-25 ENCOUNTER — Ambulatory Visit (INDEPENDENT_AMBULATORY_CARE_PROVIDER_SITE_OTHER): Payer: Commercial Managed Care - HMO | Admitting: Adult Health

## 2016-02-25 DIAGNOSIS — R05 Cough: Secondary | ICD-10-CM | POA: Diagnosis not present

## 2016-02-25 DIAGNOSIS — J449 Chronic obstructive pulmonary disease, unspecified: Secondary | ICD-10-CM

## 2016-02-25 MED ORDER — DOXYCYCLINE HYCLATE 100 MG PO TABS: 100.0000 mg | ORAL_TABLET | Freq: Two times a day (BID) | ORAL | 0 refills | Status: DC

## 2016-02-25 NOTE — Assessment & Plan Note (Signed)
Flare with bronchitis  Check cxr   Plan  Patient Instructions  Doxycycline 100mg  Twice daily  For 1 week. -take w/ food, wear sunscreen.  Mucinex DM twice daily as needed for cough congestion. Restart Symbicort 160 2 puffs twice daily, rinse after use. Chest x-ray today Saline nasal rinses As needed   Continue on Zyrtec daily Check to make sure you're not taken Afrin nasal spray if you are stopped this please follow up Dr. Melvyn Novas  In 2 months and As needed   Please contact office for sooner follow up if symptoms do not improve or worsen or seek emergency care

## 2016-02-25 NOTE — Addendum Note (Signed)
Addended by: Len Blalock on: 02/25/2016 10:51 AM   Modules accepted: Orders

## 2016-02-25 NOTE — Progress Notes (Signed)
Chart and office note reviewed in detail  > agree with a/p as outlined    

## 2016-02-25 NOTE — Progress Notes (Signed)
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 Please follow up with cardiology to discuss elective cardiac catheterization - was evaluated by Dr. Fransico Him this admission. Spoke with the card master Trish and states office will give patient a call regarding follow-up appointment. 2. RA oxygen sats to 83% so will dc on home oxygen - reassess once acute bronchitis cleared - suspect will only be a temporary need  3. Schedule follow up appointment with your PCP within 1 week after discharge  Discharge Diagnoses:   SIRS  Acute respiratory failure with hypoxia due to acute bronchitis/failed OP therapy  NSTEMI (non-ST elevated myocardial infarction)/refused IP cath  Psoriasis  Smoker   Cough due to acute bronchitis      History of present illness:  70 year old male patient with history of psoriasis on methotrexate. Presented with complaints of cough and fever. Had initially been evaluated by his primary care physician and was started on azithromycin and cough suppressant therapy but after 3 days of treatment symptoms were worsening with dyspnea on exertion and extreme fatigue.  Evaluation in the ER revealed a mildly hypertensive patient with tachycardia. He was afebrile with room air sats dipping to 83-84%. He had a white count of 20,300. Chest x-ray was suggestive of bronchitis. He also had elevated troponin at 3.79, total CK of 428, and a CKMB of 29. Patient had a prolonged stay in the ER and a second troponin was elevated at 6.16. EKG was  nonischemic and only demonstrated sinus tachycardia with nonspecific ST abnormality. Cardiology was consulted. A stat echo was completed in the ER which revealed normal LVEF without any obvious wall motion abnormalities. CT of the chest had also been accomplished and demonstrated coronary artery calcifications. No indications for emergent catheterization per Cardiology. Because of active wheezing at time of presentation beta blockers were not initiated.   Hospital Course:   NSTEMI Evaluated by cardiology this admission. Initially no beta blockers due to active wheezing at presentation. Heparin drip was initiated. Lipid panel revealed suboptimal HDL at 28. Statin therapy initiated. Cardiology offered cardiac catheterization to the patient but he refused. He is amenable to proceeding with outpatient cardiac catheterization. Patient was ambulated on room air to determine if oxygen therapy would be indicated at discharge. He did not have any chest pain or shortness of breath with ambulation even when O2 sat 83%. Cardiac enzymes trended downward last troponin was 4.21 on 12/20. Our recommendation is that the patient follow-up with cardiology as an outpatient to pursue cardiac catheterization once current respiratory symptoms have resolved. Since no wheezing on date of discharge we have ordered a selective beta blocker carvedilol to begin at 3.125 mg twice a day.  Acute hypoxemic respiratory failure secondary to bronchitis Primary reason for presentation. Was hypoxemic with sats in the mid 80s in the ER. Had previously been treated with 3 days of Zithromax without improvement in symptoms. Started on Levaquin this admission which we'll continue for total of 7 days after discharge. Sputum culture  pending at discharge. Gram stain revealed abundant gram negative rods and moderate gram positive cocci in pairs/clusters. No further wheezing since steroids have been tapered and weaned. No indication at this time to  continue steroid taper using prednisone. On room air patient had an ambulatory sats down to 83% so home oxygen has been ordered. No wheezing with ambulation. We'll continue guaifenesin and cough suppression therapy as prior to admission. Sats w/ ambulation will need to be reassessed once the pt has recovered to his baseline, as it is anticipated that he will NOT need long term O2 support.   Psoriasis on methotrexate Patient had previously held last dosage of methotrexate because he felt himself getting sick and will not resume this medication until current symptoms have resolved.  Nicotine abuse Patient counseled on sequelae of continuing to smoke including death by Dr. Sherral Hammers, as well as Dr. Thereasa Solo  Procedures:  2-D echocardiogram:   - Left ventricle: The cavity size was normal. Wall thickness was normal. Systolic function was normal. The estimated ejection fraction was in the range of 55% to 60%. GLPSS is mildly reduced at -18%, there is focal inferoseptal strain abnormality. Left ventricular diastolic function parameters were normal. - Aortic valve: Sclerosis without stenosis. There was no significant regurgitation. - Mitral valve: Calcified annulus. There was trivial regurgitation. - Left atrium: The atrium was normal in size. - Right atrium: The atrium was mildly dilated.  Subsequent LHC 05/06/14  occ RCA,  LVEDP 15 with nl EF    05/21/2014 1st [facility] office visit/ [facility]   Chief Complaint  Patient presents with  . Pulmonary Consult    Referred by Dr. Estil Daft. Pt c/o SOB for the past 2 months "with anything strenuous".  He also c/o prod cough with white sputum. He states that he was dxed with PNA and bronchitis 04/13/14. He is using albuterol about 1 x per day on average.   breathing better since admit and wants to know does he have copd and does he need 02, maintaining off cigs Not limited by breathing from desired activities but not aerobic    06/19/2014 f/u  ov/[facility] re: GOLD III copd  Chief Complaint  Patient presents with  . Follow-up    Pt states that his breathing has improved some, but not back to his normal baseline. He states that he has used rescue inhaler only 3 x in the past month.   goal is to be able to walk fast - does fine slow pace, does fine sleeping/ sitting. Not using saba much at all at this point/ no noct or early am symptoms or coughing  >>rx symbicort    02/25/2016 Acute office visit Patient presents for an acute office visit. Patient complains of 2 weeks of productive cough , very thick white mucus , wheezing and increased sob. Increased Albuterol use.  Patient denies any hemoptysis, chest pain, orthopnea, PND, or increased leg swelling. Taking zyrtec without much relief.   On Methotrexate for psorasis (taking for last 2 yrs ) .   Started on Symbicort, took for couple of months but felt he did not need it any longer.  Typically rare albuterol use.    Current Medications, Allergies, Complete Past Medical History, Past Surgical History, Family History, and Social History were reviewed in [employer] record.  ROS  The following are not active complaints unless bolded sore throat, dysphagia, dental problems, itching, sneezing,  nasal congestion or excess/ purulent secretions, ear ache,   fever, chills, sweats, unintended  wt loss, pleuritic or exertional cp, hemoptysis,  orthopnea pnd or leg swelling, presyncope, palpitations, heartburn, abdominal pain, anorexia, nausea, vomiting, diarrhea  or change in bowel or urinary habits, change in stools or urine, dysuria,hematuria,  rash, arthralgias, visual complaints, headache, numbness weakness or ataxia or problems with walking or coordination,  change in mood/affect or memory.           Objective:   Physical Exam  amb wm nad   .06/19/2014       201  Vitals:   02/25/16 1024  BP: 124/72  Pulse: (!) 57  SpO2: 95%  Weight: 209 lb (94.8 kg)  Height: 5'  9" (1.753 m)    Vital signs reviewed  HEENT: nl dentition, turbinates, and orophanx. Nl external ear canals without cough reflex   NECK :  without JVD/Nodes/TM/ nl carotid upstrokes bilaterally   LUNGS: no acc muscle use, few rhonchi    CV:  RRR  no s3 or murmur or increase in P2, no edema   ABD:  soft and nontender with nl excursion in the supine position. No bruits or organomegaly, bowel sounds nl  MS:  warm without deformities, calf tenderness, cyanosis or clubbing  SKIN: warm and dry without lesions    NEURO:  alert, approp, no deficits      Josemanuel Eakins NP-C  [facility]  02/25/2016

## 2016-02-25 NOTE — Patient Instructions (Signed)
Doxycycline 100mg  Twice daily  For 1 week. -take w/ food, wear sunscreen.  Mucinex DM twice daily as needed for cough congestion. Restart Symbicort 160 2 puffs twice daily, rinse after use. Chest x-ray today Saline nasal rinses As needed   Continue on Zyrtec daily Check to make sure you're not taken Afrin nasal spray if you are stopped this please follow up Dr. Melvyn Novas  In 2 months and As needed   Please contact office for sooner follow up if symptoms do not improve or worsen or seek emergency care

## 2016-03-24 ENCOUNTER — Other Ambulatory Visit: Payer: Self-pay | Admitting: Family Medicine

## 2016-04-18 ENCOUNTER — Other Ambulatory Visit: Payer: Self-pay | Admitting: Adult Health

## 2016-04-20 ENCOUNTER — Telehealth: Payer: Self-pay | Admitting: Internal Medicine

## 2016-04-20 MED ORDER — BUDESONIDE-FORMOTEROL FUMARATE 160-4.5 MCG/ACT IN AERO: 2.0000 | INHALATION_SPRAY | Freq: Two times a day (BID) | RESPIRATORY_TRACT | 5 refills | Status: DC

## 2016-04-20 MED ORDER — DOXYCYCLINE HYCLATE 100 MG PO TABS: 100.0000 mg | ORAL_TABLET | Freq: Two times a day (BID) | ORAL | 0 refills | Status: DC

## 2016-04-20 NOTE — Telephone Encounter (Signed)
Called and spoke with pt he stated that he needs a refill of the symbicort and doxy.   Pt also stated that he has had chest congestion since Friday---this started out as white sputum and is now yellow.  Pt denies any fever or body aches. He was last seen by TP for the same back in November and stated that the doxy and symbicort was given to him at that time and this helped.  TP please advise if we can offer doxy and symbicort  to the pt again.  Thanks  Allergies  Allergen Reactions  . Flonase [Fluticasone Propionate] Other (See Comments)    Nose bleeds.  . Penicillins Other (See Comments)    REACTION: unknown

## 2016-04-20 NOTE — Telephone Encounter (Signed)
Called and spoke with pt and he is aware of TP recs and that these meds have been sent to his pharmacy. Nothing further is needed.

## 2016-04-20 NOTE — Telephone Encounter (Signed)
That is fine  Please refill symbicort  2 puffs Twice daily  , 5 refills Doxycycline 100mg  Twice daily  #14 , no reills  If not better will need ov with Dr. Melvyn Novas   Please contact office for sooner follow up if symptoms do not improve or worsen or seek emergency care

## 2016-04-27 ENCOUNTER — Other Ambulatory Visit: Payer: Self-pay | Admitting: Family Medicine

## 2016-05-05 ENCOUNTER — Other Ambulatory Visit: Payer: Self-pay | Admitting: Family Medicine

## 2016-05-06 NOTE — Telephone Encounter (Signed)
Defer to [place], MD [facility] 05/06/2016, 10:35 AM

## 2016-05-10 ENCOUNTER — Ambulatory Visit (INDEPENDENT_AMBULATORY_CARE_PROVIDER_SITE_OTHER): Payer: Medicare HMO | Admitting: Family Medicine

## 2016-05-10 ENCOUNTER — Encounter: Payer: Self-pay | Admitting: Family Medicine

## 2016-05-10 VITALS — BP 120/67 | HR 59 | Temp 97.3°F | Ht 69.0 in | Wt 214.2 lb

## 2016-05-10 DIAGNOSIS — L409 Psoriasis, unspecified: Secondary | ICD-10-CM | POA: Diagnosis not present

## 2016-05-10 DIAGNOSIS — E785 Hyperlipidemia, unspecified: Secondary | ICD-10-CM

## 2016-05-10 DIAGNOSIS — J449 Chronic obstructive pulmonary disease, unspecified: Secondary | ICD-10-CM | POA: Diagnosis not present

## 2016-05-10 MED ORDER — METHOTREXATE 2.5 MG PO TABS: 10.0000 mg | ORAL_TABLET | ORAL | 0 refills | Status: DC

## 2016-05-10 MED ORDER — METHOTREXATE 2.5 MG PO TABS
10.0000 mg | ORAL_TABLET | ORAL | 0 refills | Status: DC
Start: 2016-05-10 — End: 2016-05-10

## 2016-05-10 NOTE — Progress Notes (Signed)
   HPI  Patient presents today here for refill of methotrexate.  Patient's been treated with methotrexate for about 2 years for psoriasis. This was started by dermatology.  He needs a refill today.  He denies any side effects or concerns for side effects today. He has an active lesion on his left arm which he is treating with topical steroids with good improvement. He states methotrexate seems to control psoriasis very well for him  PMH: Smoking status noted ROS: Per HPI  Objective: BP 120/67   Pulse (!) 59   Temp 97.3 F (36.3 C) (Oral)   Ht _0  (1.753 m)   Wt 214 lb 3.2 oz (97.2 kg)   BMI 31.63 kg/m  Gen: NAD, alert, cooperative with exam HEENT: NCAT, EOMI, PERRL CV: RRR, good S1/S2, no murmur Resp: CTABL, no wheezes, non-labored Ext: No edema, warm Neuro: Alert and oriented, No gross deficits Skin:  4 small (approx1-2 cm in diaemeter) silvery scaled plaques on the left forearm  Assessment and plan:  # Psoriasis Refilled methotrexate 3 months, also checking LFTs, CBC. Recommended that considering COPD and age that he follow-up with dermatology for long-term treatment with methotrexate. Discussed there are several side effects and possible complications that could occur with chronic methotrexate treatment.  # Hyperlipidemia Rechecking labs today, good medication compliance with statin. LDL goal less than 70 with history of NSTEMI  # COPD Symptoms stable, this does increase the risk of using methotrexate No medication changes, continue Symbicort      Orders Placed This Encounter  Procedures  . CBC with Differential/Platelet  . BMP8+EGFR  . Hepatic function panel  . LDL Cholesterol, Direct    Meds ordered this encounter  Medications  . DISCONTD: methotrexate (RHEUMATREX) 2.5 MG tablet    Sig: Take 4 tablets (10 mg total) by mouth once a week. Caution:Chemotherapy. Protect from light.    Dispense:  52 tablet    Refill:  0  . methotrexate (RHEUMATREX)  2.5 MG tablet    Sig: Take 4 tablets (10 mg total) by mouth once a week. Caution:Chemotherapy. Protect from light.    Dispense:  4 tablet    Refill:  0    Laroy Apple, MD [facility] 05/10/2016, 4:36 PM

## 2016-05-10 NOTE — Patient Instructions (Signed)
Great to see you!  I have sent 3 month's supply, however, given your other medical problems, I think it is safest to have Dr. Elige Ko prescribe your methotrexate in the future.   I have also done labs for cholesterol, we will call you with lab results within 1 week.

## 2016-05-11 LAB — LDL CHOLESTEROL, DIRECT: LDL DIRECT: 71 mg/dL (ref 0–99)

## 2016-05-11 LAB — HEPATIC FUNCTION PANEL
ALBUMIN: 4.4 g/dL (ref 3.6–4.8)
ALK PHOS: 51 IU/L (ref 39–117)
ALT: 21 IU/L (ref 0–44)
AST: 23 IU/L (ref 0–40)
BILIRUBIN, DIRECT: 0.15 mg/dL (ref 0.00–0.40)
Bilirubin Total: 0.5 mg/dL (ref 0.0–1.2)
TOTAL PROTEIN: 6.6 g/dL (ref 6.0–8.5)

## 2016-05-11 LAB — CBC WITH DIFFERENTIAL/PLATELET
BASOS ABS: 0 10*3/uL (ref 0.0–0.2)
Basos: 0 %
EOS (ABSOLUTE): 0.4 10*3/uL (ref 0.0–0.4)
Eos: 5 %
HEMOGLOBIN: 13.7 g/dL (ref 13.0–17.7)
Hematocrit: 41.3 % (ref 37.5–51.0)
IMMATURE GRANS (ABS): 0 10*3/uL (ref 0.0–0.1)
IMMATURE GRANULOCYTES: 0 %
LYMPHS: 28 %
Lymphocytes Absolute: 2 10*3/uL (ref 0.7–3.1)
MCH: 31.5 pg (ref 26.6–33.0)
MCHC: 33.2 g/dL (ref 31.5–35.7)
MCV: 95 fL (ref 79–97)
MONOCYTES: 15 %
Monocytes Absolute: 1.1 10*3/uL — ABNORMAL HIGH (ref 0.1–0.9)
NEUTROS PCT: 52 %
Neutrophils Absolute: 3.6 10*3/uL (ref 1.4–7.0)
PLATELETS: 213 10*3/uL (ref 150–379)
RBC: 4.35 x10E6/uL (ref 4.14–5.80)
RDW: 14.1 % (ref 12.3–15.4)
WBC: 7.1 10*3/uL (ref 3.4–10.8)

## 2016-05-11 LAB — BMP8+EGFR
BUN/Creatinine Ratio: 15 (ref 10–24)
BUN: 17 mg/dL (ref 8–27)
CHLORIDE: 102 mmol/L (ref 96–106)
CO2: 26 mmol/L (ref 18–29)
CREATININE: 1.17 mg/dL (ref 0.76–1.27)
Calcium: 9.7 mg/dL (ref 8.6–10.2)
GFR calc Af Amer: 73 mL/min/{1.73_m2} (ref >59)
GFR calc non Af Amer: 63 mL/min/{1.73_m2} (ref >59)
GLUCOSE: 94 mg/dL (ref 65–99)
Potassium: 4.5 mmol/L (ref 3.5–5.2)
Sodium: 142 mmol/L (ref 134–144)

## 2016-06-02 ENCOUNTER — Other Ambulatory Visit: Payer: Self-pay | Admitting: Family Medicine

## 2016-07-15 ENCOUNTER — Encounter: Payer: Medicare HMO | Admitting: Family Medicine

## 2016-07-15 ENCOUNTER — Ambulatory Visit: Payer: Medicare HMO | Admitting: Family Medicine

## 2016-07-29 ENCOUNTER — Encounter: Payer: Self-pay | Admitting: Cardiology

## 2016-08-03 ENCOUNTER — Other Ambulatory Visit: Payer: Self-pay | Admitting: Cardiology

## 2016-08-15 ENCOUNTER — Encounter: Payer: Self-pay | Admitting: Cardiology

## 2016-08-15 NOTE — Progress Notes (Signed)
Cardiology Office Note    Date:  08/16/2016   ID:  Kekoa Fyock [place], DOB 20-Feb-1946, MRN [id number]  PCP:  [name], MD  Cardiologist:  Fransico Him, MD   Chief Complaint  Patient presents with  . Coronary Artery Disease  . Hypertension  . Hyperlipidemia    History of Present Illness:  Joseph Shields is a 71 y.o. 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  He also has a history of hyperlipidemia and COPD.  He was started on lipitor and stopped it due to muscle aches and is now on Crestor and tolerating well. He also stopped the coreg because he said it made his legs swell and he is now on metoprolol. He is here today for followup. He denies any anginal chest pain. He has chronic SOB from COPD which is stable and only occurs with extreme exertion. He no longer smokes. He denies any LE edema, PND, orthopnea, claudication, dizziness, palpitations or syncope.     Past Medical History:  Diagnosis Date  . CAD (coronary artery disease)    a. 03/2014 NSTEMI - initially refused cath;  b. 03/2014 Echo: EF 55-60%;  c. 04/2014 Cath: LM nl, LAD 20-30p, 62m, D1 30-40, D2 small, 60-70, LCX nl, RCA 100p (CTO), L->R collaterals, EF 55-60%-->Med Rx.  [name] COPD (chronic obstructive pulmonary disease) ([place])    a. On home O2.  [name] History of tobacco abuse    a. 40+ pack years, quit 03/2014.  [name] Hyperlipidemia   . Morbid obesity ([place])   . Psoriasis     Past Surgical History:  Procedure Laterality Date  . LEFT HEART CATHETERIZATION WITH CORONARY ANGIOGRAM N/A 05/06/2014   Procedure: LEFT HEART CATHETERIZATION WITH CORONARY ANGIOGRAM;  Surgeon: Leonie Man, MD;  Location: [facility] CATH LAB;  Service: Cardiovascular;  Laterality: N/A;  . SPINE SURGERY      Current Medications: Current Meds  Medication Sig  . aspirin EC 81 MG tablet Take 81 mg by  mouth daily.  . folic acid (FOLVITE) 1 MG tablet TAKE ONE TABLET BY MOUTH ONCE DAILY  . methotrexate (RHEUMATREX) 2.5 MG tablet Take 4 tablets (10 mg total) by mouth once a week. Caution:Chemotherapy. Protect from light.  . metoprolol succinate (TOPROL-XL) 25 MG 24 hr tablet TAKE 1 TABLET EVERY DAY  . rosuvastatin (CRESTOR) 20 MG tablet TAKE 1 TABLET EVERY DAY  . triamcinolone ointment (KENALOG) 0.1 % Apply 1 application topically 2 (two) times daily.    Allergies:   Flonase [fluticasone propionate] and Penicillins   Social History   Social History  . Marital status: Married    Spouse name: N/A  . Number of children: N/A  . Years of education: N/A   Social History Main Topics  . Smoking status: Former Smoker    Packs/day: 0.50    Years: 49.00    Types: Cigarettes    Quit date: 04/07/2014  . Smokeless tobacco: Never Used  . Alcohol use No  . Drug use: No  . Sexual activity: Yes   Other Topics Concern  . None   Social History Narrative  . None     Family History:  The patient's family history includes Allergies in his mother; Asthma in his mother; Heart disease in his father; Stroke in his mother.   ROS:   Please see the history of present illness.  ROS All other systems reviewed and are negative.  No flowsheet data found.     PHYSICAL EXAM:   VS:  BP 128/80   Pulse 60   Ht 5\' 8"  (1.727 m)   Wt 210 lb (95.3 kg)   BMI 31.93 kg/m    GEN: Well nourished, well developed, in no acute distress  HEENT: normal  Neck: no JVD, carotid bruits, or masses Cardiac: RRR; no murmurs, rubs, or gallops,no edema.  Intact distal pulses bilaterally.  Respiratory:  clear to auscultation bilaterally, normal work of breathing GI: soft, nontender, nondistended, + BS MS: no deformity or atrophy  Skin: warm and dry, no rash Neuro:  Alert and Oriented x 3, Strength and sensation are intact Psych: euthymic mood, full affect  Wt Readings from Last 3 Encounters:  08/16/16 210 lb  (95.3 kg)  05/10/16 214 lb 3.2 oz (97.2 kg)  02/25/16 209 lb (94.8 kg)      Studies/Labs Reviewed:   EKG:  EKG is ordered today.  The ekg ordered today demonstrates NSR at 60bpm with no ST changes  Recent Labs: 11/05/2015: Hemoglobin 13.6 05/10/2016: ALT 21; BUN 17; Creatinine, Ser 1.17; Platelets 213; Potassium 4.5; Sodium 142   Lipid Panel    Component Value Date/Time   CHOL 127 09/29/2015 0914   TRIG 77 09/29/2015 0914   HDL 48 09/29/2015 0914   CHOLHDL 2.6 09/29/2015 0914   VLDL 15 09/29/2015 0914   LDLCALC 64 09/29/2015 0914   LDLDIRECT 71 05/10/2016 1639    Additional studies/ records that were reviewed today include:  none    ASSESSMENT:    1. Coronary artery disease of native artery of native heart with stable angina pectoris ([place])   2. Pure hypercholesterolemia   3. SOB (shortness of breath)      PLAN:  In order of problems listed above:  1.  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  He has not had any anginal symptoms.  He will continue on ASA, statin, BB. 2.  Hyperlipidemia with LDL goal < 70.  His LDL was 71 in January 2018.  He will continue on crestor. I encouraged him to start into an exercise program of walking.  3.  Chronic SOB secondary to COPD.  This appears stable.  He takes BID Symbicort.    Medication Adjustments/Labs and Tests Ordered: Current medicines are reviewed at length with the patient today.  Concerns regarding medicines are outlined above.  Medication changes, Labs and Tests ordered today are listed in the Patient Instructions below.  There are no Patient Instructions on file for this visit.   Signed, Fransico Him, MD  08/16/2016 9:19 AM    [facility] [place], [place],   [address] Phone: [phone]; Fax: [phone]

## 2016-08-16 ENCOUNTER — Encounter: Payer: Self-pay | Admitting: Cardiology

## 2016-08-16 ENCOUNTER — Ambulatory Visit (INDEPENDENT_AMBULATORY_CARE_PROVIDER_SITE_OTHER): Payer: Medicare HMO | Admitting: Cardiology

## 2016-08-16 VITALS — BP 128/80 | HR 60 | Ht 68.0 in | Wt 210.0 lb

## 2016-08-16 DIAGNOSIS — R0602 Shortness of breath: Secondary | ICD-10-CM | POA: Diagnosis not present

## 2016-08-16 DIAGNOSIS — E7800 Pure hypercholesterolemia, unspecified: Secondary | ICD-10-CM | POA: Diagnosis not present

## 2016-08-16 DIAGNOSIS — I25118 Atherosclerotic heart disease of native coronary artery with other forms of angina pectoris: Secondary | ICD-10-CM | POA: Diagnosis not present

## 2016-08-16 MED ORDER — METOPROLOL SUCCINATE ER 25 MG PO TB24: 25.0000 mg | ORAL_TABLET | Freq: Every day | ORAL | 3 refills | Status: DC

## 2016-08-16 NOTE — Patient Instructions (Signed)

## 2016-09-03 ENCOUNTER — Other Ambulatory Visit: Payer: Self-pay | Admitting: Cardiology

## 2016-09-23 ENCOUNTER — Other Ambulatory Visit: Payer: Self-pay | Admitting: Family Medicine

## 2016-10-01 ENCOUNTER — Other Ambulatory Visit: Payer: Self-pay | Admitting: Cardiology

## 2016-11-26 ENCOUNTER — Other Ambulatory Visit: Payer: Self-pay | Admitting: Family Medicine

## 2016-11-29 NOTE — Telephone Encounter (Signed)
Last seen 05/10/16   Dr Livia Snellen

## 2016-11-29 NOTE — Telephone Encounter (Signed)
Needs to be seen for refill, did he get in to see dermatology yet?

## 2016-12-07 ENCOUNTER — Other Ambulatory Visit: Payer: Self-pay | Admitting: Family Medicine

## 2017-01-10 ENCOUNTER — Other Ambulatory Visit: Payer: Self-pay | Admitting: Family Medicine

## 2017-01-11 NOTE — Telephone Encounter (Signed)
Rx sent to pharmacy   

## 2017-01-31 ENCOUNTER — Encounter: Payer: Self-pay | Admitting: Cardiology

## 2017-02-14 ENCOUNTER — Ambulatory Visit (INDEPENDENT_AMBULATORY_CARE_PROVIDER_SITE_OTHER): Payer: Medicare HMO | Admitting: Cardiology

## 2017-02-14 ENCOUNTER — Encounter: Payer: Self-pay | Admitting: Cardiology

## 2017-02-14 VITALS — BP 140/78 | HR 68 | Ht 68.0 in | Wt 205.4 lb

## 2017-02-14 DIAGNOSIS — E7800 Pure hypercholesterolemia, unspecified: Secondary | ICD-10-CM | POA: Diagnosis not present

## 2017-02-14 DIAGNOSIS — I2510 Atherosclerotic heart disease of native coronary artery without angina pectoris: Secondary | ICD-10-CM | POA: Diagnosis not present

## 2017-02-14 LAB — HEPATIC FUNCTION PANEL
ALBUMIN: 4.5 g/dL (ref 3.5–4.8)
ALK PHOS: 53 IU/L (ref 39–117)
ALT: 19 IU/L (ref 0–44)
AST: 28 IU/L (ref 0–40)
Bilirubin Total: 0.6 mg/dL (ref 0.0–1.2)
Bilirubin, Direct: 0.18 mg/dL (ref 0.00–0.40)
TOTAL PROTEIN: 7.1 g/dL (ref 6.0–8.5)

## 2017-02-14 LAB — LIPID PANEL
CHOL/HDL RATIO: 3 ratio (ref 0.0–5.0)
CHOLESTEROL TOTAL: 130 mg/dL (ref 100–199)
HDL: 44 mg/dL (ref >39)
LDL CALC: 67 mg/dL (ref 0–99)
TRIGLYCERIDES: 96 mg/dL (ref 0–149)
VLDL CHOLESTEROL CAL: 19 mg/dL (ref 5–40)

## 2017-02-14 NOTE — Progress Notes (Signed)
Cardiology Office Note:    Date:  02/14/2017   ID:  Joseph Shields, DOB [birth year], MRN [id number]  PCP:  [name], MD  Cardiologist:  Fransico Him, MD   Referring MD: [name], MD   Chief Complaint  Patient presents with  . Coronary Artery Disease  . Hyperlipidemia    History of Present Illness:    Joseph Shields is a 71 y.o. 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  He also has a history of hyperlipidemia and COPD.  He was started on lipitor and stopped it due to muscle aches and is now on Crestor and tolerating well. He also stopped the coreg because he said it made his legs swell and he is now on metoprolol.    He is here today for followup and is doing well.  He denies any chest pain or pressure, PND, orthopnea, LE edema, dizziness, palpitations or syncope. He has chronic SOB that has not change and is due to COPD.  He is compliant with his meds and is tolerating meds with no SE.    Past Medical History:  Diagnosis Date  . CAD (coronary artery disease)    a. 03/2014 NSTEMI - initially refused cath;  b. 03/2014 Echo: EF 55-60%;  c. 04/2014 Cath: LM nl, LAD 20-30p, 59m, D1 30-40, D2 small, 60-70, LCX nl, RCA 100p (CTO), L->R collaterals, EF 55-60%-->Med Rx.  [name] COPD (chronic obstructive pulmonary disease) ([place])    a. On home O2.  [name] History of tobacco abuse    a. 40+ pack years, quit 03/2014.  [name] Hyperlipidemia   . Morbid obesity ([place])   . Psoriasis     Past Surgical History:  Procedure Laterality Date  . LEFT HEART CATHETERIZATION WITH CORONARY ANGIOGRAM N/A 05/06/2014   Procedure: LEFT HEART CATHETERIZATION WITH CORONARY ANGIOGRAM;  Surgeon: Leonie Man, MD;  Location: [facility] CATH LAB;  Service: Cardiovascular;  Laterality: N/A;  . SPINE SURGERY      Current Medications: Current Meds  Medication Sig  . aspirin EC  81 MG tablet Take 81 mg by mouth daily.  . folic acid (FOLVITE) 1 MG tablet TAKE 1 TABLET BY MOUTH ONCE DAILY  . methotrexate (RHEUMATREX) 2.5 MG tablet Take 4 tablets (10 mg total) by mouth once a week. Caution:Chemotherapy. Protect from light.  . metoprolol succinate (TOPROL-XL) 25 MG 24 hr tablet Take 1 tablet (25 mg total) by mouth daily.  . rosuvastatin (CRESTOR) 20 MG tablet Take 1 tablet (20 mg total) by mouth daily.  [name] triamcinolone ointment (KENALOG) 0.1 % Apply 1 application topically 2 (two) times daily.     Allergies:   Flonase [fluticasone propionate] and Penicillins   Social History   Social History  . Marital status: Married    Spouse name: N/A  . Number of children: N/A  . Years of education: N/A   Social History Main Topics  . Smoking status: Former Smoker    Packs/day: 0.50    Years: 49.00    Types: Cigarettes    Quit date: 04/07/2014  . Smokeless tobacco: Never Used  . Alcohol use No  . Drug use: No  . Sexual activity: Yes   Other Topics Concern  . None   Social History Narrative  . None     Family History: The patient's family history includes Allergies in his mother;  Asthma in his mother; Heart disease in his father; Stroke in his mother.  ROS:   Please see the history of present illness.    ROS  All other systems reviewed and negative.   EKGs/Labs/Other Studies Reviewed:    The following studies were reviewed today: none  EKG:  EKG is not ordered today.    Recent Labs: 05/10/2016: ALT 21; BUN 17; Creatinine, Ser 1.17; Hemoglobin 13.7; Platelets 213; Potassium 4.5; Sodium 142   Recent Lipid Panel    Component Value Date/Time   CHOL 127 09/29/2015 0914   TRIG 77 09/29/2015 0914   HDL 48 09/29/2015 0914   CHOLHDL 2.6 09/29/2015 0914   VLDL 15 09/29/2015 0914   LDLCALC 64 09/29/2015 0914   LDLDIRECT 71 05/10/2016 1639    Physical Exam:    VS:  BP 140/78   Pulse 68   Ht 5\' 8"  (1.727 m)   Wt 205 lb 6.4 oz (93.2 kg)   SpO2 95%    BMI 31.23 kg/m     Wt Readings from Last 3 Encounters:  02/14/17 205 lb 6.4 oz (93.2 kg)  08/16/16 210 lb (95.3 kg)  05/10/16 214 lb 3.2 oz (97.2 kg)     GEN:  Well nourished, well developed in no acute distress HEENT: Normal NECK: No JVD; No carotid bruits LYMPHATICS: No lymphadenopathy CARDIAC: RRR, no murmurs, rubs, gallops RESPIRATORY:  Clear to auscultation without rales, wheezing or rhonchi  ABDOMEN: Soft, non-tender, non-distended MUSCULOSKELETAL:  No edema; No deformity  SKIN: Warm and dry NEUROLOGIC:  Alert and oriented x 3 PSYCHIATRIC:  Normal affect   ASSESSMENT:    1. Coronary artery disease involving native coronary artery of native heart without angina pectoris   2. Pure hypercholesterolemia    PLAN:    In order of problems listed above:  1.  ASCAD -  severe single vessel with occluded proximal RCA with left to right collaterals and moderate disease of a small to moderate diagonal branch on medical management.  He denies any anginal symptoms.  He will continue on Toprol XL 25mg  daily, ASA 81mg  daily and statin.   2.  Hyperlipidemia with LDL goal < 70.  He will continue on Crestor 20mg  daily.    Medication Adjustments/Labs and Tests Ordered: Current medicines are reviewed at length with the patient today.  Concerns regarding medicines are outlined above.  No orders of the defined types were placed in this encounter.  No orders of the defined types were placed in this encounter.   Signed, Fransico Him, MD  02/14/2017 8:50 AM    [place]

## 2017-02-14 NOTE — Patient Instructions (Signed)
Your physician recommends that you continue on your current medications as directed. Please refer to the Current Medication list given to you today.  Your physician recommends that you return for lab work today (Liver/Lipid Panel)  Your physician wants you to follow-up in: 1 year with Dr. Radford Pax.  You will receive a reminder letter in the mail two months in advance. If you don't receive a letter, please call our office to schedule the follow-up appointment.

## 2017-03-21 ENCOUNTER — Other Ambulatory Visit: Payer: Self-pay | Admitting: Family Medicine

## 2017-03-22 NOTE — Telephone Encounter (Signed)
Left detailed message per dpr  

## 2017-03-24 DIAGNOSIS — R05 Cough: Secondary | ICD-10-CM | POA: Diagnosis not present

## 2017-03-24 DIAGNOSIS — J40 Bronchitis, not specified as acute or chronic: Secondary | ICD-10-CM | POA: Diagnosis not present

## 2017-04-12 ENCOUNTER — Telehealth: Payer: Self-pay | Admitting: Family Medicine

## 2017-04-12 ENCOUNTER — Other Ambulatory Visit: Payer: Self-pay | Admitting: Family Medicine

## 2017-04-12 NOTE — Telephone Encounter (Signed)
Last seen 11/8

## 2017-04-12 NOTE — Telephone Encounter (Signed)
Patient needs to be seen for medication refill and labwork only.  Appointment made 05/05/17 at 3:25, patient aware.

## 2017-04-15 ENCOUNTER — Other Ambulatory Visit: Payer: Self-pay | Admitting: Family Medicine

## 2017-04-15 ENCOUNTER — Ambulatory Visit: Payer: Medicare HMO | Admitting: Family Medicine

## 2017-04-20 NOTE — Telephone Encounter (Signed)
Last seen 02/28/17

## 2017-05-05 ENCOUNTER — Encounter: Payer: Self-pay | Admitting: Family Medicine

## 2017-05-05 ENCOUNTER — Ambulatory Visit (INDEPENDENT_AMBULATORY_CARE_PROVIDER_SITE_OTHER): Payer: Medicare HMO | Admitting: Family Medicine

## 2017-05-05 VITALS — BP 142/78 | HR 63 | Temp 97.8°F | Ht 68.0 in | Wt 211.8 lb

## 2017-05-05 DIAGNOSIS — L409 Psoriasis, unspecified: Secondary | ICD-10-CM | POA: Diagnosis not present

## 2017-05-05 DIAGNOSIS — R7303 Prediabetes: Secondary | ICD-10-CM

## 2017-05-05 LAB — BAYER DCA HB A1C WAIVED: HB A1C (BAYER DCA - WAIVED): 5.4 % (ref <7.0)

## 2017-05-05 MED ORDER — METHOTREXATE 2.5 MG PO TABS: 10.0000 mg | ORAL_TABLET | ORAL | 0 refills | Status: DC

## 2017-05-05 MED ORDER — METHOTREXATE 2.5 MG PO TABS: 10.0000 mg | ORAL_TABLET | ORAL | 1 refills | Status: DC

## 2017-05-05 NOTE — Patient Instructions (Signed)
Great to see you!  Come back in 6 months unless you need us sooner.    

## 2017-05-05 NOTE — Progress Notes (Signed)
   HPI  Patient presents today for follow-up on psoriasis and prediabetes.  Patient has not really watching his diet, he does state that he understands to limit his carbohydrates but in the holiday season and has had several dietary indiscretion.  Psoriasis He has been out of methotrexate for about 3 weeks, he has lesions popping up on his bilateral arms.   PMH: Smoking status noted ROS: Per HPI  Objective: BP (!) 142/78   Pulse 63   Temp 97.8 F (36.6 C) (Oral)   Ht 5\' 8"  (1.727 m)   Wt 211 lb 12.8 oz (96.1 kg)   BMI 32.20 kg/m  Gen: NAD, alert, cooperative with exam HEENT: NCAT CV: RRR, good S1/S2, no murmur Resp: CTABL, no wheezes, non-labored Ext: No edema, warm Neuro: Alert and oriented, No gross deficits  Skin: 30-50 erythematous scaly lesions on bilateral arms measuring approximately 1 cm in diameter  Assessment and plan:  #Psoriasis Uncontrolled with patient missing his methotrexate for 3 weeks Explains that we should have at least every 10-month follow-up for labs. Refilled methotrexate times 6 months Also given 2 weeks supply at a local pharmacy. Labs  #Prediabetes Patient not well engaged, controlled likely Discussed therapeutic lifestyle changes, A1c pending    Meds ordered this encounter  Medications  . DISCONTD: methotrexate (RHEUMATREX) 2.5 MG tablet    Sig: Take 4 tablets (10 mg total) by mouth once a week. Caution:Chemotherapy. Protect from light.    Dispense:  48 tablet    Refill:  1  . methotrexate (RHEUMATREX) 2.5 MG tablet    Sig: Take 4 tablets (10 mg total) by mouth once a week. Caution:Chemotherapy. Protect from light.    Dispense:  8 tablet    Refill:  0    Laroy Apple, MD [facility] 05/05/2017, 3:45 PM

## 2017-05-06 LAB — CMP14+EGFR
ALK PHOS: 50 IU/L (ref 39–117)
ALT: 15 IU/L (ref 0–44)
AST: 19 IU/L (ref 0–40)
Albumin/Globulin Ratio: 1.8 (ref 1.2–2.2)
Albumin: 4.3 g/dL (ref 3.5–4.8)
BUN/Creatinine Ratio: 12 (ref 10–24)
BUN: 13 mg/dL (ref 8–27)
Bilirubin Total: 0.5 mg/dL (ref 0.0–1.2)
CO2: 24 mmol/L (ref 20–29)
CREATININE: 1.08 mg/dL (ref 0.76–1.27)
Calcium: 8.9 mg/dL (ref 8.6–10.2)
Chloride: 104 mmol/L (ref 96–106)
GFR calc Af Amer: 80 mL/min/{1.73_m2} (ref >59)
GFR calc non Af Amer: 69 mL/min/{1.73_m2} (ref >59)
GLOBULIN, TOTAL: 2.4 g/dL (ref 1.5–4.5)
Glucose: 94 mg/dL (ref 65–99)
POTASSIUM: 4.3 mmol/L (ref 3.5–5.2)
Sodium: 145 mmol/L — ABNORMAL HIGH (ref 134–144)
Total Protein: 6.7 g/dL (ref 6.0–8.5)

## 2017-05-06 LAB — CBC WITH DIFFERENTIAL/PLATELET
Basophils Absolute: 0 10*3/uL (ref 0.0–0.2)
Basos: 1 %
EOS (ABSOLUTE): 0.4 10*3/uL (ref 0.0–0.4)
EOS: 5 %
Hematocrit: 39.7 % (ref 37.5–51.0)
Hemoglobin: 13.4 g/dL (ref 13.0–17.7)
IMMATURE GRANULOCYTES: 0 %
Immature Grans (Abs): 0 10*3/uL (ref 0.0–0.1)
LYMPHS: 38 %
Lymphocytes Absolute: 2.7 10*3/uL (ref 0.7–3.1)
MCH: 30.3 pg (ref 26.6–33.0)
MCHC: 33.8 g/dL (ref 31.5–35.7)
MCV: 90 fL (ref 79–97)
MONOS ABS: 1 10*3/uL — AB (ref 0.1–0.9)
Monocytes: 15 %
Neutrophils Absolute: 2.9 10*3/uL (ref 1.4–7.0)
Neutrophils: 41 %
PLATELETS: 174 10*3/uL (ref 150–379)
RBC: 4.42 x10E6/uL (ref 4.14–5.80)
RDW: 14.5 % (ref 12.3–15.4)
WBC: 7 10*3/uL (ref 3.4–10.8)

## 2017-06-13 ENCOUNTER — Other Ambulatory Visit: Payer: Self-pay | Admitting: Cardiology

## 2017-07-25 ENCOUNTER — Ambulatory Visit (INDEPENDENT_AMBULATORY_CARE_PROVIDER_SITE_OTHER): Payer: Medicare HMO | Admitting: *Deleted

## 2017-07-25 ENCOUNTER — Encounter: Payer: Self-pay | Admitting: *Deleted

## 2017-07-25 VITALS — BP 147/87 | HR 66 | Ht 65.0 in | Wt 212.0 lb

## 2017-07-25 DIAGNOSIS — H9193 Unspecified hearing loss, bilateral: Secondary | ICD-10-CM

## 2017-07-25 DIAGNOSIS — Z0000 Encounter for general adult medical examination without abnormal findings: Secondary | ICD-10-CM

## 2017-07-25 NOTE — Progress Notes (Addendum)
Subjective:   Joseph Shields is a 72 y.o. male who presents for a subsequent Medicare Annual Wellness Visit. Joseph Shields lives at home with his wife. He is a retired Hotel manager and retired in 2004. He enjoys hunting and working outside in his yard. He used to play golf but hasn't played in years. He does not do any formal exercise. He has 2 daughters and 4 grandchildren.   Review of Systems  Health is about the same as last year  Cardiac Risk Factors include: hypertension;male gender;obesity (BMI >30kg/m2);sedentary lifestyle;dyslipidemia  Other systems negative   Objective:    Today's Vitals   07/25/17 1023 07/25/17 1029  BP: (!) 153/91 (!) 147/87  Pulse: 66 66  Weight: 212 lb (96.2 kg)   Height: 5\' 5"  (1.651 m)   PainSc:  0-No pain   Body mass index is 35.28 kg/m.  Advanced Directives 07/25/2017 05/06/2014 04/14/2014 04/13/2014  Does Patient Have a Medical Advance Directive? No No No No  Would patient like information on creating a medical advance directive? Yes (MAU/Ambulatory/Procedural Areas - Information given) No - patient declined information No - patient declined information -    Current Medications (verified) Outpatient Encounter Medications as of 07/25/2017  Medication Sig  . aspirin EC 81 MG tablet Take 81 mg by mouth daily.  . folic acid (FOLVITE) 1 MG tablet TAKE 1 TABLET BY MOUTH ONCE DAILY  . methotrexate (RHEUMATREX) 2.5 MG tablet Take 4 tablets (10 mg total) by mouth once a week. Caution:Chemotherapy. Protect from light.  . metoprolol succinate (TOPROL-XL) 25 MG 24 hr tablet TAKE 1 TABLET (25 MG TOTAL) BY MOUTH DAILY.  . rosuvastatin (CRESTOR) 20 MG tablet Take 1 tablet (20 mg total) by mouth daily.  [name] triamcinolone ointment (KENALOG) 0.1 % APPLY ONE APPLICATION TOPICALLY TWO TIMES A DAY   No facility-administered encounter medications on file as of 07/25/2017.     Allergies (verified) Flonase [fluticasone propionate] and Penicillins    History: Past Medical History:  Diagnosis Date  . CAD (coronary artery disease)    a. 03/2014 NSTEMI - initially refused cath;  b. 03/2014 Echo: EF 55-60%;  c. 04/2014 Cath: LM nl, LAD 20-30p, 63m, D1 30-40, D2 small, 60-70, LCX nl, RCA 100p (CTO), L->R collaterals, EF 55-60%-->Med Rx.  [name] COPD (chronic obstructive pulmonary disease) ([place])    a. On home O2.  [name] History of tobacco abuse    a. 40+ pack years, quit 03/2014.  [name] Hyperlipidemia   . Morbid obesity ([place])   . Psoriasis    Past Surgical History:  Procedure Laterality Date  . LEFT HEART CATHETERIZATION WITH CORONARY ANGIOGRAM N/A 05/06/2014   Procedure: LEFT HEART CATHETERIZATION WITH CORONARY ANGIOGRAM;  Surgeon: Leonie Man, MD;  Location: [facility] CATH LAB;  Service: Cardiovascular;  Laterality: N/A;  . SPINE SURGERY     Family History  Problem Relation Age of Onset  . Stroke Mother 63  . Allergies Mother   . Asthma Mother   . Cancer Mother        colon  . Heart disease Father 30       quadruple bypass  . Healthy Daughter   . Healthy Daughter    Social History   Socioeconomic History  . Marital status: Married    Spouse name: Not on file  . Number of children: 2  . Years of education: associates degree  . Highest education level: Associate degree: occupational, Hotel manager, or vocational program  Occupational History  . Occupation:  Retired    Comment: Hotel manager  Social Needs  . Financial resource strain: Not hard at all  . Food insecurity:    Worry: Never true    Inability: Never true  . Transportation needs:    Medical: No    Non-medical: No  Tobacco Use  . Smoking status: Former Smoker    Packs/day: 0.50    Years: 49.00    Pack years: 24.50    Types: Cigarettes    Last attempt to quit: 04/07/2014    Years since quitting: 3.3  . Smokeless tobacco: Never Used  Substance and Sexual Activity  . Alcohol use: No    Alcohol/week: 0.0 oz  . Drug use: No  . Sexual activity: Yes   Lifestyle  . Physical activity:    Days per week: 0 days    Minutes per session: 0 min  . Stress: Not at all  Relationships  . Social connections:    Talks on phone: More than three times a week    Gets together: More than three times a week    Attends religious service: More than 4 times per year    Active member of club or organization: Yes    Attends meetings of clubs or organizations: More than 4 times per year    Relationship status: Married  Other Topics Concern  . Not on file  Social History Narrative  . Not on file   Tobacco Counseling No tobacco use  Clinical Intake:     Pain : 0-10 Pain Score: 0-No pain     Nutritional Status: BMI > 30  Obese Diabetes: No  How often do you need to have someone help you when you read instructions, pamphlets, or other written materials from your doctor or pharmacy?: 1 - Never What is the last grade level you completed in school?: associates degree-technical  Interpreter Needed?: No  Information entered by :: Chong Sicilian, RN  Activities of Daily Living In your present state of health, do you have any difficulty performing the following activities: 07/25/2017  Hearing? Y  Comment has noticed a decrease in hearing and would like a referral  Vision? N  Comment Last eye exam was about 3 years ago  Difficulty concentrating or making decisions? N  Walking or climbing stairs? N  Dressing or bathing? N  Doing errands, shopping? N  Preparing Food and eating ? N  Using the Toilet? N  In the past six months, have you accidently leaked urine? N  Do you have problems with loss of bowel control? N  Managing your Medications? N  Managing your Finances? N  Housekeeping or managing your Housekeeping? N  Some recent data might be hidden     Immunizations and Health Maintenance Immunization History  Administered Date(s) Administered  . Influenza, High Dose Seasonal PF 04/04/2015  . Pneumococcal Conjugate-13 04/04/2015   There  are no preventive care reminders to display for this patient.  Patient Care Team: [name], MD as PCP - General (Family Medicine)  No hospitalizations, ER visits, or surgeries this past year.      Assessment:   This is a routine wellness examination for Joseph Shields.  Hearing/Vision screen No deficits noted during visit. Patient has noticed a decrease in hearing and is interested in a hearing evaluation.   Dietary issues and exercise activities discussed: Current Exercise Habits: Home exercise routine, Exercise limited by: None identified  Goals    . Exercise 150 min/wk Moderate Activity  Depression Screen PHQ 2/9 Scores 07/25/2017 05/05/2017 05/10/2016 11/03/2015  PHQ - 2 Score 0 0 0 0    Fall Risk Fall Risk  07/25/2017 05/05/2017 05/10/2016 11/03/2015 10/15/2015  Falls in the past year? No No No No No    Cognitive Function: MMSE - Mini Mental State Exam 07/25/2017  Orientation to time 5  Orientation to Place 5  Registration 3  Attention/ Calculation 5  Recall 2  Language- name 2 objects 2  Language- repeat 1  Language- follow 3 step command 3  Language- read & follow direction 1  Write a sentence 1  Copy design 1  Total score 29    normal exam    Screening Tests Health Maintenance  Topic Date Due  . TETANUS/TDAP  09/24/2017 (Originally 11/02/1964)  . PNA vac Low Risk Adult (2 of 2 - PPSV23) 09/24/2017 (Originally 04/03/2016)  . INFLUENZA VACCINE  01/05/2018 (Originally 11/24/2017)  . COLON CANCER SCREENING ANNUAL FOBT  02/07/2018  . Hepatitis C Screening  Completed  . COLONOSCOPY  Discontinued       Plan:   referral to audiologist placed per patient's request Keep f/u with PCP Increase activity level. Aim for 150 minutes of moderate activity a week Review Advance Directives and return a signed/notarized copy to our office.   I have personally reviewed and noted the following in the patient's chart:   . Medical and social history . Use of alcohol, tobacco  or illicit drugs  . Current medications and supplements . Functional ability and status . Nutritional status . Physical activity . Advanced directives . List of other physicians . Hospitalizations, surgeries, and ER visits in previous 12 months . Vitals . Screenings to include cognitive, depression, and falls . Referrals and appointments  In addition, I have reviewed and discussed with patient certain preventive protocols, quality metrics, and best practice recommendations. A written personalized care plan for preventive services as well as general preventive health recommendations were provided to patient.     Chong Sicilian, RN   07/27/2017     I have reviewed and agree with the above AWV documentation.    Laroy Apple, MD [facility] 07/28/2017, 12:08 PM

## 2017-07-25 NOTE — Patient Instructions (Addendum)
  Joseph Shields , Thank you for taking time to come for your Medicare Wellness Visit. I appreciate your ongoing commitment to your health goals. Please review the following plan we discussed and let me know if I can assist you in the future.   These are the goals we discussed: Goals    . Exercise 150 min/wk Moderate Activity       This is a list of the screening recommended for you and due dates:  Health Maintenance  Topic Date Due  . Tetanus Vaccine  09/24/2017*  . Pneumonia vaccines (2 of 2 - PPSV23) 09/24/2017*  . Flu Shot  01/05/2018*  . Stool Blood Test  02/07/2018  .  Hepatitis C: One time screening is recommended by Center for Disease Control  (CDC) for  adults born from [birth year] through 1964.   Completed  . Colon Cancer Screening  Discontinued  *Topic was postponed. The date shown is not the original due date.   An order for a hearing evaluation was placed today. You should receive a call within the next week. If you don't please contact our referral department at [phone].

## 2017-09-12 ENCOUNTER — Other Ambulatory Visit: Payer: Self-pay | Admitting: *Deleted

## 2017-09-12 MED ORDER — ROSUVASTATIN CALCIUM 20 MG PO TABS: 20.0000 mg | ORAL_TABLET | Freq: Every day | ORAL | 1 refills | Status: DC

## 2017-10-17 ENCOUNTER — Ambulatory Visit (INDEPENDENT_AMBULATORY_CARE_PROVIDER_SITE_OTHER): Payer: Medicare HMO | Admitting: Family Medicine

## 2017-10-17 ENCOUNTER — Encounter: Payer: Self-pay | Admitting: Family Medicine

## 2017-10-17 VITALS — BP 136/77 | HR 63 | Temp 97.7°F | Ht 65.0 in | Wt 211.0 lb

## 2017-10-17 DIAGNOSIS — R7303 Prediabetes: Secondary | ICD-10-CM | POA: Diagnosis not present

## 2017-10-17 DIAGNOSIS — E7800 Pure hypercholesterolemia, unspecified: Secondary | ICD-10-CM | POA: Diagnosis not present

## 2017-10-17 DIAGNOSIS — L409 Psoriasis, unspecified: Secondary | ICD-10-CM

## 2017-10-17 DIAGNOSIS — Z23 Encounter for immunization: Secondary | ICD-10-CM

## 2017-10-17 LAB — BAYER DCA HB A1C WAIVED: HB A1C (BAYER DCA - WAIVED): 5.9 % (ref <7.0)

## 2017-10-17 MED ORDER — FOLIC ACID 1 MG PO TABS: 1.0000 mg | ORAL_TABLET | Freq: Every day | ORAL | 3 refills | Status: DC

## 2017-10-17 MED ORDER — METHOTREXATE 2.5 MG PO TABS: 10.0000 mg | ORAL_TABLET | ORAL | 1 refills | Status: DC

## 2017-10-17 NOTE — Progress Notes (Signed)
   HPI  Patient presents today follow-up chronic medical conditions.  Psoriasis Doing well with methotrexate, needs refill. No active lesions except for a small red dots on the legs that respond well to Kenalog ointment.  Hyperlipidemia Good Crestor compliance and tolerance.   PMH: Smoking status noted ROS: Per HPI  Objective: BP 136/77   Pulse 63   Temp 97.7 F (36.5 C) (Oral)   Ht _0  (1.651 m)   Wt 211 lb (95.7 kg)   BMI 35.11 kg/m  Gen: NAD, alert, cooperative with exam HEENT: NCAT CV: RRR, good S1/S2, no murmur Resp: CTABL, no wheezes, non-labored Ext: No edema, warm Neuro: Alert and oriented, No gross deficits  Assessment and plan:  #Psoriasis On methotrexate, doing well refilled medication x6 months Recommend LFTs and CBC every 6 months   #Prediabetes Repeat labs, A1c last checked 2015.   #Hyperlipidemia Repeat labs, tolerating Crestor well, no changes Does have history of CAD  Counseling provided for all vaccines and vaccine components-patient is relatively immune compromised given treatment with methotrexate, consider reimmunization in 6 years.   Orders Placed This Encounter  Procedures  . CMP14+EGFR  . CBC with Differential/Platelet  . Lipid panel  . TSH  . Bayer DCA Hb A1c Waived    Meds ordered this encounter  Medications  . folic acid (FOLVITE) 1 MG tablet    Sig: Take 1 tablet (1 mg total) by mouth daily.    Dispense:  90 tablet    Refill:  3    Please consider 90 day supplies to promote better adherence  . methotrexate (RHEUMATREX) 2.5 MG tablet    Sig: Take 4 tablets (10 mg total) by mouth once a week. Caution:Chemotherapy. Protect from light.    Dispense:  48 tablet    Refill:  [place], MD [place] 10/17/2017, 11:15 AM

## 2017-10-17 NOTE — Patient Instructions (Signed)
Great to see you!  Come back in 6 months to see Dr. Livia Snellen

## 2017-10-18 LAB — SPECIMEN STATUS REPORT

## 2017-10-20 LAB — LIPID PANEL
Chol/HDL Ratio: 2.7 ratio (ref 0.0–5.0)
Cholesterol, Total: 125 mg/dL (ref 100–199)
HDL: 46 mg/dL (ref >39)
LDL CALC: 63 mg/dL (ref 0–99)
Triglycerides: 80 mg/dL (ref 0–149)
VLDL CHOLESTEROL CAL: 16 mg/dL (ref 5–40)

## 2017-10-20 LAB — CMP14+EGFR
A/G RATIO: 1.8 (ref 1.2–2.2)
ALBUMIN: 4.4 g/dL (ref 3.5–4.8)
ALT: 17 IU/L (ref 0–44)
AST: 20 IU/L (ref 0–40)
Alkaline Phosphatase: 50 IU/L (ref 39–117)
BUN / CREAT RATIO: 16 (ref 10–24)
BUN: 16 mg/dL (ref 8–27)
Bilirubin Total: 0.8 mg/dL (ref 0.0–1.2)
CALCIUM: 9 mg/dL (ref 8.6–10.2)
CO2: 24 mmol/L (ref 20–29)
Chloride: 104 mmol/L (ref 96–106)
Creatinine, Ser: 1 mg/dL (ref 0.76–1.27)
GFR calc Af Amer: 88 mL/min/{1.73_m2} (ref >59)
GFR, EST NON AFRICAN AMERICAN: 76 mL/min/{1.73_m2} (ref >59)
GLOBULIN, TOTAL: 2.5 g/dL (ref 1.5–4.5)
Glucose: 99 mg/dL (ref 65–99)
POTASSIUM: 5.2 mmol/L (ref 3.5–5.2)
SODIUM: 140 mmol/L (ref 134–144)
Total Protein: 6.9 g/dL (ref 6.0–8.5)

## 2017-10-20 LAB — CBC WITH DIFFERENTIAL/PLATELET

## 2017-10-20 LAB — TSH: TSH: 1.38 u[IU]/mL (ref 0.450–4.500)

## 2017-11-10 IMAGING — US US SCROTUM
1 series · 13 of 25 positions shown · non-contrast
Comparison: None

CLINICAL DATA: RIGHT testicular pain and swelling for 3 days, fever
for 6 days

EXAM:
SCROTAL ULTRASOUND
DOPPLER ULTRASOUND OF THE TESTICLES
TECHNIQUE: Complete ultrasound examination of the testicles, epididymis, and
other scrotal structures was performed. Color and spectral Doppler
ultrasound were also utilized to evaluate blood flow to the
testicles.

[Series 1: us scrotum · 0.06mm/px · 13 of 54 slices shown]
[im 1/54]
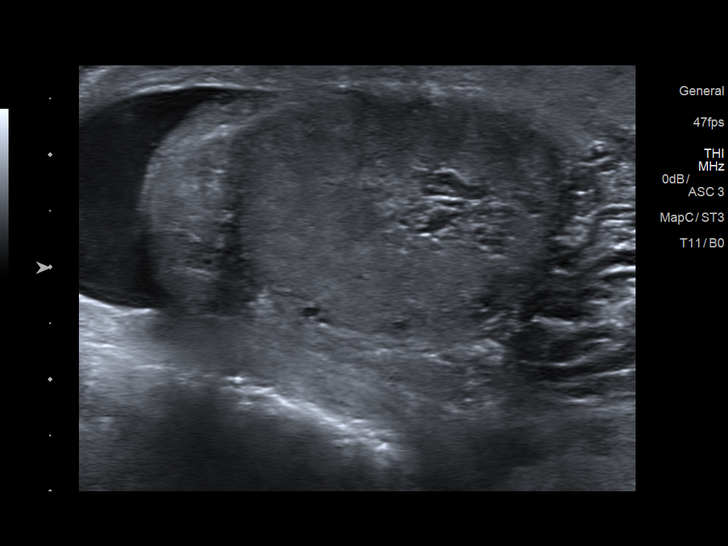
[im 5/54]
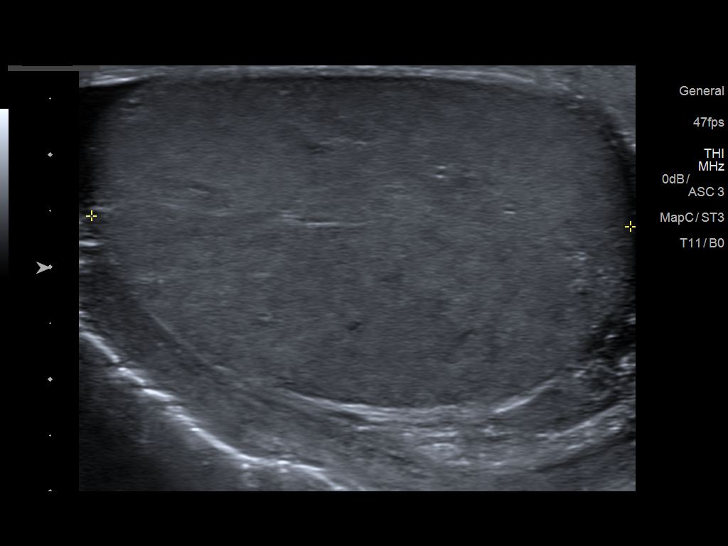
[im 9/54]
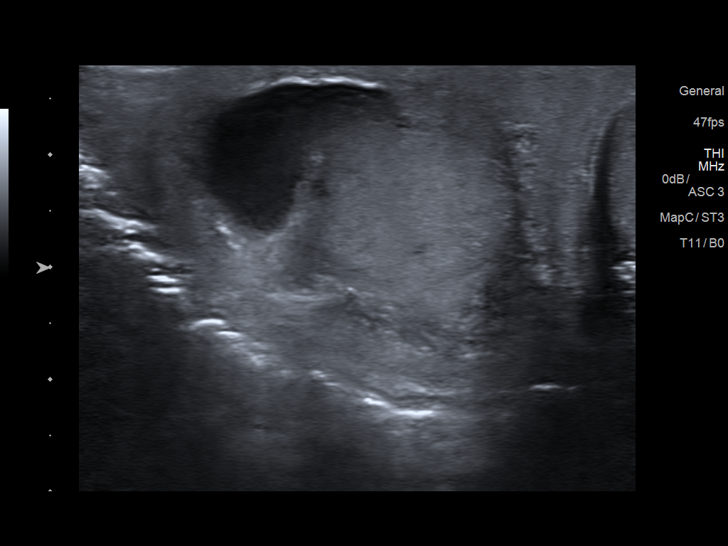
[im 14/54]
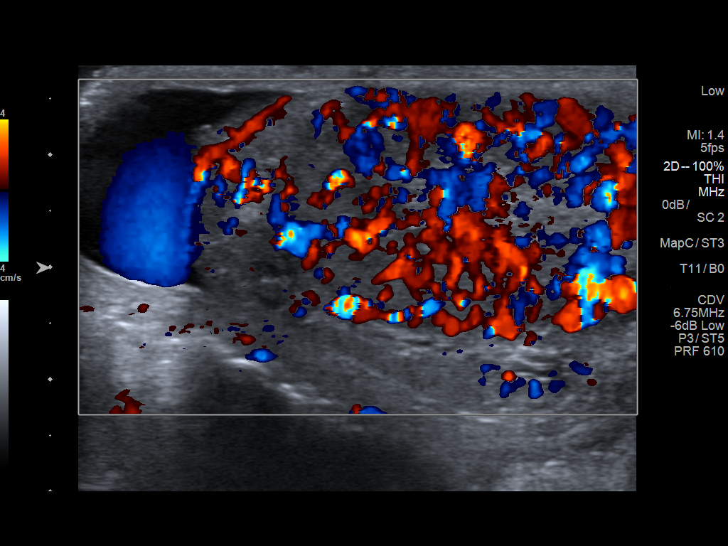
[im 18/54]
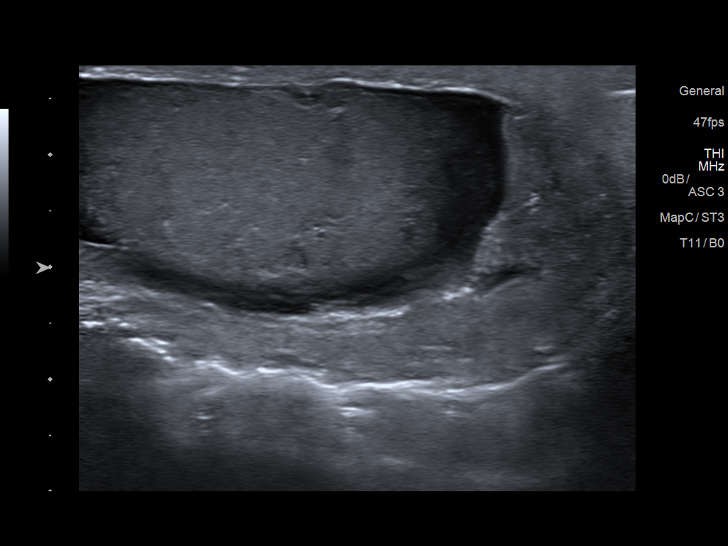
[im 23/54]
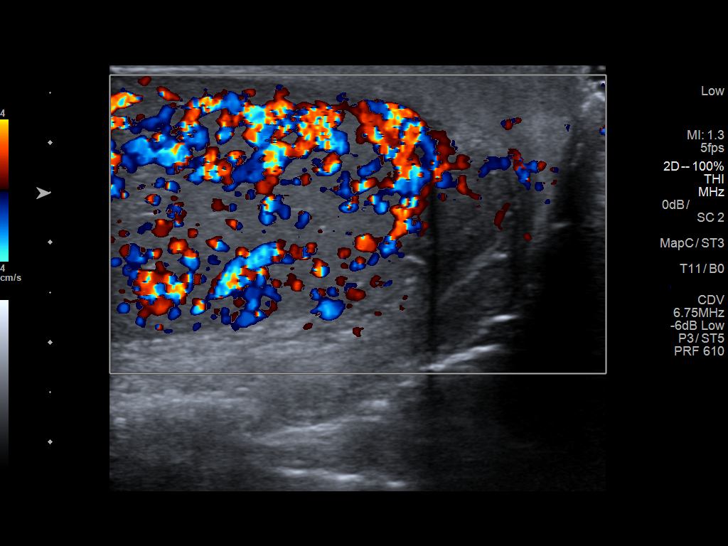
[im 27/54]
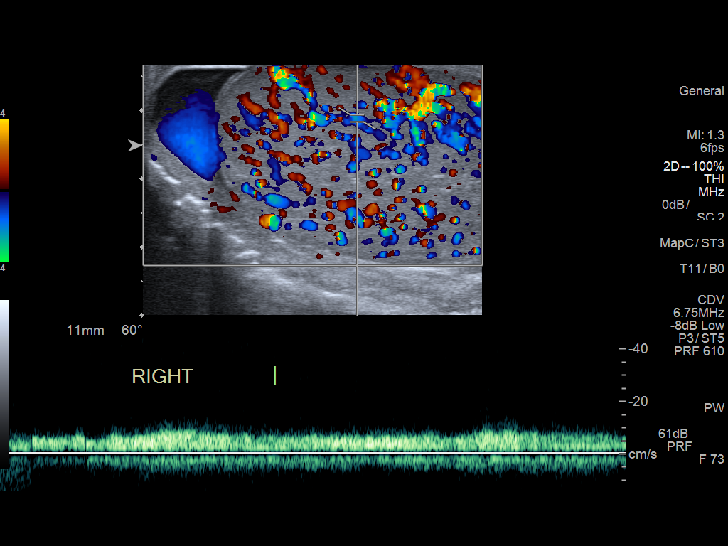
[im 31/54]
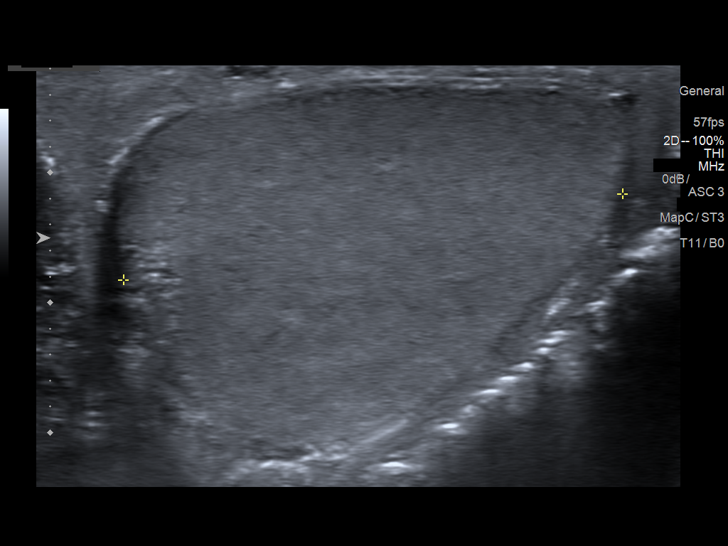
[im 36/54]
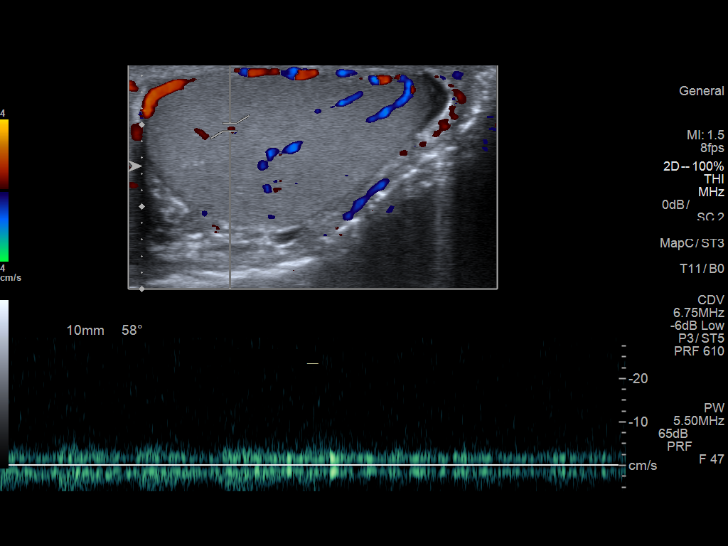
[im 40/54]
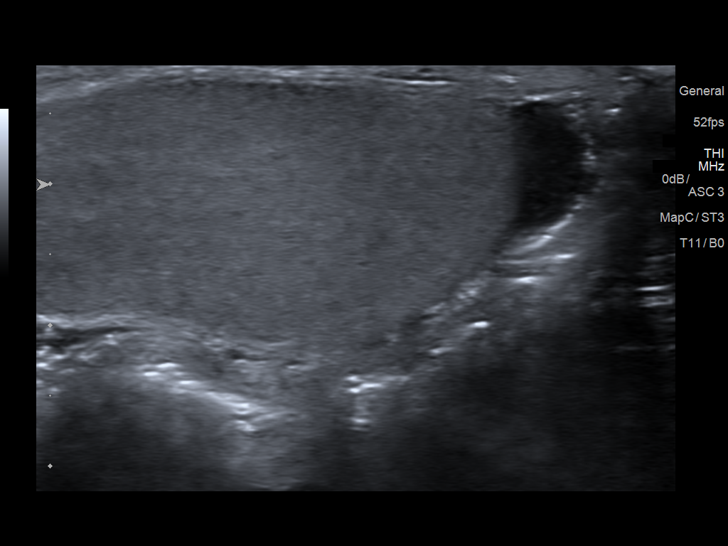
[im 45/54]
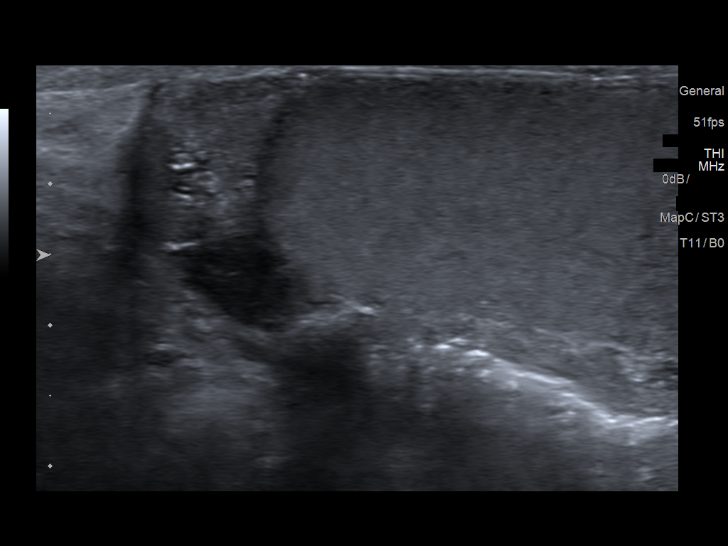
[im 49/54]
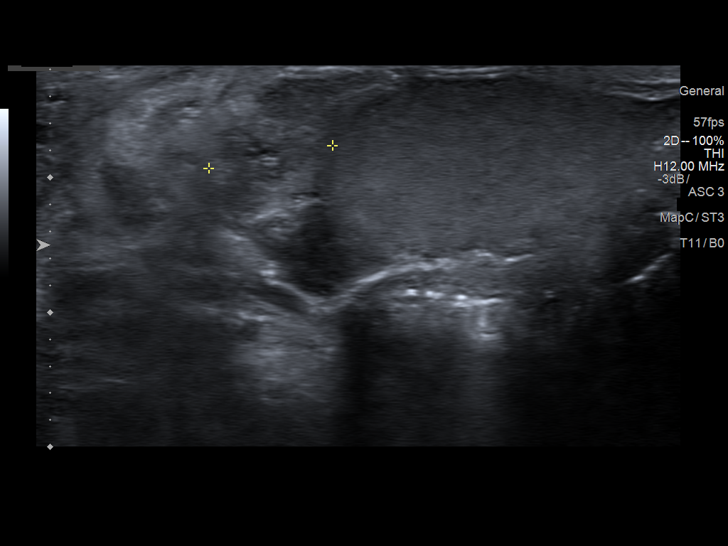
[im 54/54]
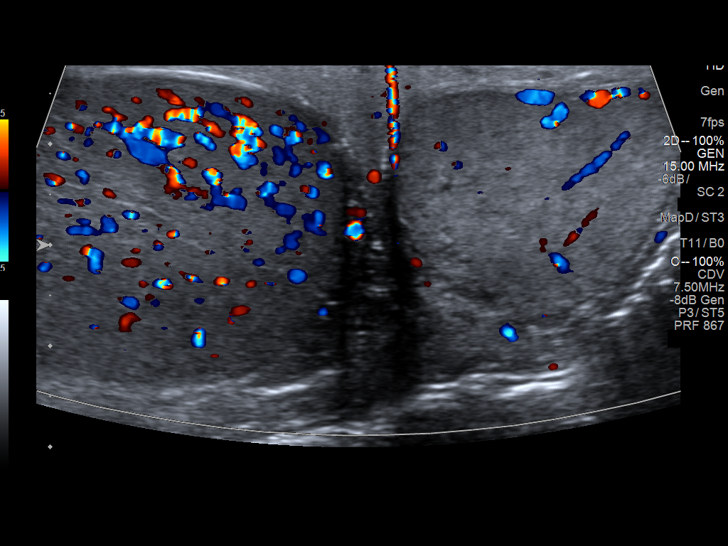

[13 of 25 positions shown; findings below may reference images not displayed]

FINDINGS: Right testicle

Measurements: 6.0 x 2.8 x 4.8 cm. Asymmetric enlargement versus LEFT
testis. Heterogeneous mildly decreased parenchymal echogenicity
without discrete mass or calcification. Prominent internal blood
flow on color Doppler imaging.

Left testicle

Measurements: 4.4 x 1.9 x 3.9 cm. Normal morphology without mass or
calcification. Internal blood flow present on color Doppler imaging.

Right epididymis: Normal morphology without mass. Hypervascular on
color Doppler imaging.

Left epididymis:  Normal in size and appearance.

Hydrocele:  Small BILATERAL hydroceles

Varicocele: Significant hypervascularity of vessels in RIGHT
spermatic cord secondary to hyperemia.

Pulsed Doppler interrogation of both testes demonstrates normal low
resistance arterial and venous waveforms bilaterally.
IMPRESSION: No evidence of testicular mass or torsion.

Enlarged heterogeneous RIGHT testis which is markedly hypervascular
on color Doppler imaging and associated with epididymal
hypervascularity consistent with acute RIGHT epididymo-orchitis.

## 2018-02-27 NOTE — Progress Notes (Signed)
Cardiology Office Note:    Date:  02/28/2018   ID:  Laveda Norman., DOB 1946/01/13, MRN [id number]  PCP:  Patient, No Pcp Per  Cardiologist:  No primary care provider on file.    Referring MD: [name], MD   Chief Complaint  Patient presents with  . Coronary Artery Disease  . Hyperlipidemia    History of Present Illness:    Shneur [place]. is a 72 y.o. 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 He also has a history of hyperlipidemia and COPD. He was started on lipitor and stopped it due to muscle aches and is now on Crestor and tolerating well. He also stopped the coreg because he said it made his legs swell and he is now on metoprolol.    He is here today for followup and is doing well.  He denies any chest pain or pressure, SOB, DOE, PND, orthopnea, LE edema, dizziness, palpitations or syncope. He is compliant with his meds and is tolerating meds with no SE.    Past Medical History:  Diagnosis Date  . CAD (coronary artery disease)    a. 03/2014 NSTEMI - initially refused cath;  b. 03/2014 Echo: EF 55-60%;  c. 04/2014 Cath: LM nl, LAD 20-30p, 81m, D1 30-40, D2 small, 60-70, LCX nl, RCA 100p (CTO), L->R collaterals, EF 55-60%-->Med Rx.  [name] COPD (chronic obstructive pulmonary disease) ([place])    a. On home O2.  [name] History of tobacco abuse    a. 40+ pack years, quit 03/2014.  [name] Hyperlipidemia   . Morbid obesity ([place])   . Psoriasis     Past Surgical History:  Procedure Laterality Date  . LEFT HEART CATHETERIZATION WITH CORONARY ANGIOGRAM N/A 05/06/2014   Procedure: LEFT HEART CATHETERIZATION WITH CORONARY ANGIOGRAM;  Surgeon: Leonie Man, MD;  Location: [facility] CATH LAB;  Service: Cardiovascular;  Laterality: N/A;  . SPINE SURGERY      Current Medications: Current Meds  Medication Sig  . aspirin EC 81 MG tablet Take 81  mg by mouth daily.  . folic acid (FOLVITE) 1 MG tablet Take 1 tablet (1 mg total) by mouth daily.  . methotrexate (RHEUMATREX) 2.5 MG tablet Take 4 tablets (10 mg total) by mouth once a week. Caution:Chemotherapy. Protect from light.  . metoprolol succinate (TOPROL-XL) 25 MG 24 hr tablet TAKE 1 TABLET (25 MG TOTAL) BY MOUTH DAILY.  . rosuvastatin (CRESTOR) 20 MG tablet Take 1 tablet (20 mg total) by mouth daily.  [name] triamcinolone ointment (KENALOG) 0.1 % APPLY ONE APPLICATION TOPICALLY TWO TIMES A DAY     Allergies:   Flonase [fluticasone propionate] and Penicillins   Social History   Socioeconomic History  . Marital status: Married    Spouse name: Not on file  . Number of children: 2  . Years of education: associates degree  . Highest education level: Associate degree: occupational, Hotel manager, or vocational program  Occupational History  . Occupation: Retired    Comment: Hotel manager  Social Needs  . Financial resource strain: Not hard at all  . Food insecurity:    Worry: Never true    Inability: Never true  . Transportation needs:    Medical: No    Non-medical: No  Tobacco Use  . Smoking status: Former Smoker    Packs/day: 0.50    Years: 49.00  Pack years: 24.50    Types: Cigarettes    Last attempt to quit: 04/07/2014    Years since quitting: 3.8  . Smokeless tobacco: Never Used  Substance and Sexual Activity  . Alcohol use: No    Alcohol/week: 0.0 standard drinks  . Drug use: No  . Sexual activity: Yes  Lifestyle  . Physical activity:    Days per week: 0 days    Minutes per session: 0 min  . Stress: Not at all  Relationships  . Social connections:    Talks on phone: More than three times a week    Gets together: More than three times a week    Attends religious service: More than 4 times per year    Active member of club or organization: Yes    Attends meetings of clubs or organizations: More than 4 times per year    Relationship status:  Married  Other Topics Concern  . Not on file  Social History Narrative  . Not on file     Family History: The patient's family history includes Allergies in his mother; Asthma in his mother; Cancer in his mother; Healthy in his daughter and daughter; Heart disease (age of onset: 68) in his father; Stroke (age of onset: 61) in his mother.  ROS:   Please see the history of present illness.    ROS  All other systems reviewed and negative.   EKGs/Labs/Other Studies Reviewed:    The following studies were reviewed today: none  EKG:  EKG is  ordered today.  The ekg ordered today demonstrates normal sinus rhythm with no ST changes.  Recent Labs: 10/17/2017: ALT 17; BUN 16; Creatinine, Ser 1.00; Hemoglobin CANCELED; Platelets CANCELED; Potassium 5.2; Sodium 140; TSH 1.380   Recent Lipid Panel    Component Value Date/Time   CHOL 125 10/17/2017 1104   TRIG 80 10/17/2017 1104   HDL 46 10/17/2017 1104   CHOLHDL 2.7 10/17/2017 1104   CHOLHDL 2.6 09/29/2015 0914   VLDL 15 09/29/2015 0914   LDLCALC 63 10/17/2017 1104   LDLDIRECT 71 05/10/2016 1639    Physical Exam:    VS:  BP 132/84   Pulse 62   Ht 5\' 5"  (1.651 m)   Wt 211 lb 3.2 oz (95.8 kg)   SpO2 92%   BMI 35.15 kg/m     Wt Readings from Last 3 Encounters:  02/28/18 211 lb 3.2 oz (95.8 kg)  10/17/17 211 lb (95.7 kg)  07/25/17 212 lb (96.2 kg)     GEN:  Well nourished, well developed in no acute distress HEENT: Normal NECK: No JVD; No carotid bruits LYMPHATICS: No lymphadenopathy CARDIAC: RRR, no murmurs, rubs, gallops RESPIRATORY:  Clear to auscultation without rales, wheezing or rhonchi  ABDOMEN: Soft, non-tender, non-distended MUSCULOSKELETAL:  No edema; No deformity  SKIN: Warm and dry NEUROLOGIC:  Alert and oriented x 3 PSYCHIATRIC:  Normal affect   ASSESSMENT:    1. Coronary artery disease involving native coronary artery of native heart without angina pectoris   2. Pure hypercholesterolemia   3. Morbid  obesity ([place])    PLAN:    In order of problems listed above:  1.  ASCAD - s/p NSTEMI in 03/2014 and refused cath but then agreed to cath 04/2014 which showed severe single vessel ASCAD with occluded proximal RCA with left to right collaterals and moderate disease of a small to moderate diagonal branch on medical management. He has not had any anginal symptoms.  He will continue  on aspirin 81 mg daily, Toprol-XL 25 mg daily and statin.  2.  Hyperlipidemia -LDL goal is less than 70.  He will continue on Crestor 20 mg daily.  His LDL was at goal at 63 and ALT normal on 10/17/2017.  3.  Morbid Obesity - I have encouraged him to get into a routine exercise program and cut back on carbs and portions.    Medication Adjustments/Labs and Tests Ordered: Current medicines are reviewed at length with the patient today.  Concerns regarding medicines are outlined above.  Orders Placed This Encounter  Procedures  . EKG 12-Lead   No orders of the defined types were placed in this encounter.   Signed, Fransico Him, MD  02/28/2018 9:28 AM    [place]

## 2018-02-28 ENCOUNTER — Ambulatory Visit: Payer: Medicare HMO | Admitting: Cardiology

## 2018-02-28 ENCOUNTER — Encounter: Payer: Self-pay | Admitting: Cardiology

## 2018-02-28 VITALS — BP 132/84 | HR 62 | Ht 65.0 in | Wt 211.2 lb

## 2018-02-28 DIAGNOSIS — E7800 Pure hypercholesterolemia, unspecified: Secondary | ICD-10-CM | POA: Diagnosis not present

## 2018-02-28 DIAGNOSIS — I2510 Atherosclerotic heart disease of native coronary artery without angina pectoris: Secondary | ICD-10-CM

## 2018-02-28 NOTE — Patient Instructions (Signed)

## 2018-03-13 ENCOUNTER — Other Ambulatory Visit: Payer: Self-pay | Admitting: Cardiology

## 2018-04-07 ENCOUNTER — Other Ambulatory Visit: Payer: Self-pay | Admitting: *Deleted

## 2018-04-07 MED ORDER — METHOTREXATE 2.5 MG PO TABS: 10.0000 mg | ORAL_TABLET | ORAL | 0 refills | Status: DC

## 2018-04-21 ENCOUNTER — Encounter: Payer: Self-pay | Admitting: Family Medicine

## 2018-04-21 ENCOUNTER — Ambulatory Visit (INDEPENDENT_AMBULATORY_CARE_PROVIDER_SITE_OTHER): Payer: Medicare HMO | Admitting: Family Medicine

## 2018-04-21 VITALS — BP 136/74 | HR 59 | Temp 98.0°F | Ht 65.0 in | Wt 212.8 lb

## 2018-04-21 DIAGNOSIS — Z0001 Encounter for general adult medical examination with abnormal findings: Secondary | ICD-10-CM | POA: Diagnosis not present

## 2018-04-21 DIAGNOSIS — Z0000 Encounter for general adult medical examination without abnormal findings: Secondary | ICD-10-CM

## 2018-04-21 DIAGNOSIS — E7800 Pure hypercholesterolemia, unspecified: Secondary | ICD-10-CM

## 2018-04-21 DIAGNOSIS — R7303 Prediabetes: Secondary | ICD-10-CM | POA: Diagnosis not present

## 2018-04-21 DIAGNOSIS — E785 Hyperlipidemia, unspecified: Secondary | ICD-10-CM

## 2018-04-21 LAB — BAYER DCA HB A1C WAIVED: HB A1C (BAYER DCA - WAIVED): 5.7 % (ref <7.0)

## 2018-04-21 MED ORDER — FOLIC ACID 1 MG PO TABS: 1.0000 mg | ORAL_TABLET | Freq: Every day | ORAL | 3 refills | Status: DC

## 2018-04-21 MED ORDER — METHOTREXATE 2.5 MG PO TABS: 10.0000 mg | ORAL_TABLET | ORAL | 0 refills | Status: DC

## 2018-04-21 MED ORDER — TRIAMCINOLONE ACETONIDE 0.1 % EX OINT: TOPICAL_OINTMENT | CUTANEOUS | 2 refills | Status: DC

## 2018-04-21 NOTE — Progress Notes (Signed)
Hello Json,  Your lab result is normal.Some minor variations that are not significant are commonly marked abnormal, but do not represent any medical problem for you.  Best regards, Isacc Turney, M.D.

## 2018-04-21 NOTE — Progress Notes (Signed)
Subjective:  Patient ID: Joseph Norman., male    DOB: 02/27/1946  Age: 72 y.o. MRN: [id number]  CC: Hyperlipidemia (6 month follow up) and Establish Care Joseph Shields patient)   HPI Joseph Shields. presents for follow-up of his ASCVD.  He had a heart attack which was triggered by a pneumonia couple of years ago.  Since that time he has been monitoring his cholesterol as well and taking Crestor.  He is also taking metoprolol for its antianginal effect and it seems to be working and that he is having no chest pain or shortness of breath.  He notes that his heart attack never did cause chest pain it was found when his shortness of breath was worsened during an attack of hypoxemia brought on by a pneumonia.  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  Elevated cholesterol. Doing well without complaints on current medication. Denies side effects of statin including myalgia and arthralgia and nausea. Also in today for liver function testing. Currently no chest pain, shortness of breath or other cardiovascular related symptoms noted.  Patient also takes Rheumatrex for his psoriasis.  He says if he skips it for a couple of weeks he is okay but by the time he gets to the third week he will have plaques starting to erupt.  He notes that because of this he tends to get a little easy bruising from his aspirin and he is cut that back to every other day he admits this was against the advice of his cardiologist.  Patient does take folic acid daily due to the use of the methotrexate.   History Joseph Shields has a past medical history of CAD (coronary artery disease), COPD (chronic obstructive pulmonary disease) ([place]), History of tobacco abuse, Hyperlipidemia, Morbid obesity ([place]), and Psoriasis.   He has a past surgical history that includes Spine surgery and left heart catheterization with coronary angiogram (N/A, 05/06/2014).   His family history includes Allergies in his mother; Asthma in his mother; Cancer  in his mother; Healthy in his daughter and daughter; Heart disease (age of onset: 43) in his father; Stroke (age of onset: 82) in his mother.He reports that he quit smoking about 4 years ago. His smoking use included cigarettes. He has a 24.50 pack-year smoking history. He has never used smokeless tobacco. He reports that he does not drink alcohol or use drugs.  Current Outpatient Medications on File Prior to Visit  Medication Sig Dispense Refill  . aspirin EC 81 MG tablet Take 81 mg by mouth daily.    . metoprolol succinate (TOPROL-XL) 25 MG 24 hr tablet TAKE 1 TABLET EVERY DAY 90 tablet 3  . rosuvastatin (CRESTOR) 20 MG tablet TAKE 1 TABLET EVERY DAY 90 tablet 3   No current facility-administered medications on file prior to visit.     ROS Review of Systems  Constitutional: Negative.  Negative for activity change, fatigue and unexpected weight change.  HENT: Negative.  Negative for congestion, ear pain, hearing loss, postnasal drip and trouble swallowing.   Eyes: Negative for pain and visual disturbance.  Respiratory: Negative for cough, chest tightness and shortness of breath.   Cardiovascular: Negative for chest pain, palpitations and leg swelling.  Gastrointestinal: Negative for abdominal distention, abdominal pain, blood in stool, constipation, diarrhea, nausea and vomiting.  Endocrine: Negative for cold intolerance, heat intolerance and polydipsia.  Genitourinary: Negative for difficulty urinating, dysuria, flank pain, frequency and urgency.  Musculoskeletal: Negative for arthralgias, joint swelling and myalgias.  Skin:  Negative for color change, rash and wound.  Neurological: Negative for dizziness, syncope, speech difficulty, weakness, light-headedness, numbness and headaches.  Hematological: Does not bruise/bleed easily.  Psychiatric/Behavioral: Negative for confusion, decreased concentration, dysphoric mood and sleep disturbance. The patient is not nervous/anxious.      Objective:  BP 136/74   Pulse (!) 59   Temp 98 F (36.7 C) (Oral)   Ht _0  (1.651 m)   Wt 212 lb 12.8 oz (96.5 kg)   BMI 35.41 kg/m   BP Readings from Last 3 Encounters:  04/21/18 136/74  02/28/18 132/84  10/17/17 136/77    Wt Readings from Last 3 Encounters:  04/21/18 212 lb 12.8 oz (96.5 kg)  02/28/18 211 lb 3.2 oz (95.8 kg)  10/17/17 211 lb (95.7 kg)     Physical Exam Constitutional:      General: He is not in acute distress.    Appearance: He is well-developed.  HENT:     Head: Normocephalic and atraumatic.     Right Ear: External ear normal.     Left Ear: External ear normal.     Nose: Nose normal.  Eyes:     Conjunctiva/sclera: Conjunctivae normal.     Pupils: Pupils are equal, round, and reactive to light.  Neck:     Musculoskeletal: Normal range of motion and neck supple.     Thyroid: No thyromegaly.     Trachea: No tracheal deviation.  Cardiovascular:     Rate and Rhythm: Normal rate and regular rhythm.     Heart sounds: Normal heart sounds. No murmur. No friction rub. No gallop.   Pulmonary:     Effort: Pulmonary effort is normal. No respiratory distress.     Breath sounds: Normal breath sounds. No wheezing or rales.  Abdominal:     General: Bowel sounds are normal. There is no distension.     Palpations: Abdomen is soft. There is no mass.     Tenderness: There is no abdominal tenderness.     Hernia: There is no hernia in the right inguinal area or left inguinal area.  Genitourinary:    Penis: Normal.      Scrotum/Testes: Normal.  Musculoskeletal: Normal range of motion.  Lymphadenopathy:     Cervical: No cervical adenopathy.  Skin:    General: Skin is warm and dry.  Neurological:     Mental Status: He is alert and oriented to person, place, and time.     Deep Tendon Reflexes: Reflexes are normal and symmetric.  Psychiatric:        Behavior: Behavior normal.        Thought Content: Thought content normal.        Judgment: Judgment  normal.     Lab Results  Component Value Date   HGBA1C 5.7 04/21/2018   HGBA1C 5.9 10/17/2017   HGBA1C 5.4 05/05/2017    Lab Results  Component Value Date   WBC CANCELED 10/17/2017   HGB CANCELED 10/17/2017   HCT CANCELED 10/17/2017   PLT CANCELED 10/17/2017   GLUCOSE 99 10/17/2017   CHOL 125 10/17/2017   TRIG 80 10/17/2017   HDL 46 10/17/2017   LDLDIRECT 71 05/10/2016   LDLCALC 63 10/17/2017   ALT 17 10/17/2017   AST 20 10/17/2017   NA 140 10/17/2017   K 5.2 10/17/2017   CL 104 10/17/2017   CREATININE 1.00 10/17/2017   BUN 16 10/17/2017   CO2 24 10/17/2017   TSH 1.380 10/17/2017   INR 1.0 05/03/2014  HGBA1C 5.7 04/21/2018    Dg Chest 2 View  Result Date: 02/25/2016 CLINICAL DATA:  Cough for 2 weeks. EXAM: CHEST  2 VIEW COMPARISON:  Radiographs of February 19, 2014. FINDINGS: The heart size and mediastinal contours are within normal limits. Both lungs are clear. No pneumothorax or pleural effusion is noted. The visualized skeletal structures are unremarkable. IMPRESSION: No active cardiopulmonary disease. Electronically Signed   By: Marijo Conception, M.D.   On: 02/25/2016 11:16    Assessment & Plan:   Lynton was seen today for hyperlipidemia and establish care.  Diagnoses and all orders for this visit:  Well adult exam  Hyperlipidemia, unspecified hyperlipidemia type -     CBC with Differential/Platelet -     CMP14+EGFR -     PSA, total and free  Pure hypercholesterolemia  Pre-diabetes -     Bayer DCA Hb A1c Waived  Other orders -     folic acid (FOLVITE) 1 MG tablet; Take 1 tablet (1 mg total) by mouth daily. -     methotrexate (RHEUMATREX) 2.5 MG tablet; Take 4 tablets (10 mg total) by mouth once a week. Caution:Chemotherapy. Protect from light. -     triamcinolone ointment (KENALOG) 0.1 %; APPLY ONE APPLICATION TOPICALLY TWO TIMES A DAY   I am having Ritesh L. Talsma Brooke Bonito. maintain his aspirin EC, rosuvastatin, metoprolol succinate, folic acid,  methotrexate, and triamcinolone ointment.  Meds ordered this encounter  Medications  . folic acid (FOLVITE) 1 MG tablet    Sig: Take 1 tablet (1 mg total) by mouth daily.    Dispense:  90 tablet    Refill:  3    Please consider 90 day supplies to promote better adherence  . methotrexate (RHEUMATREX) 2.5 MG tablet    Sig: Take 4 tablets (10 mg total) by mouth once a week. Caution:Chemotherapy. Protect from light.    Dispense:  48 tablet    Refill:  0  . triamcinolone ointment (KENALOG) 0.1 %    Sig: APPLY ONE APPLICATION TOPICALLY TWO TIMES A DAY    Dispense:  454 g    Refill:  2    Please consider 90 day supplies to promote better adherence     Follow-up: Return in about 6 months (around 10/21/2018).  [name], M.D.

## 2018-04-22 LAB — CMP14+EGFR
ALBUMIN: 4.4 g/dL (ref 3.5–4.8)
ALT: 18 IU/L (ref 0–44)
AST: 22 IU/L (ref 0–40)
Albumin/Globulin Ratio: 1.9 (ref 1.2–2.2)
Alkaline Phosphatase: 51 IU/L (ref 39–117)
BILIRUBIN TOTAL: 0.7 mg/dL (ref 0.0–1.2)
BUN / CREAT RATIO: 10 (ref 10–24)
BUN: 10 mg/dL (ref 8–27)
CALCIUM: 9 mg/dL (ref 8.6–10.2)
CO2: 23 mmol/L (ref 20–29)
Chloride: 101 mmol/L (ref 96–106)
Creatinine, Ser: 1 mg/dL (ref 0.76–1.27)
GFR calc non Af Amer: 75 mL/min/{1.73_m2} (ref >59)
GFR, EST AFRICAN AMERICAN: 87 mL/min/{1.73_m2} (ref >59)
GLOBULIN, TOTAL: 2.3 g/dL (ref 1.5–4.5)
Glucose: 99 mg/dL (ref 65–99)
POTASSIUM: 4.7 mmol/L (ref 3.5–5.2)
SODIUM: 138 mmol/L (ref 134–144)
TOTAL PROTEIN: 6.7 g/dL (ref 6.0–8.5)

## 2018-04-22 LAB — CBC WITH DIFFERENTIAL/PLATELET
BASOS: 1 %
Basophils Absolute: 0 10*3/uL (ref 0.0–0.2)
EOS (ABSOLUTE): 0.5 10*3/uL — ABNORMAL HIGH (ref 0.0–0.4)
EOS: 7 %
HEMATOCRIT: 42.3 % (ref 37.5–51.0)
Hemoglobin: 14 g/dL (ref 13.0–17.7)
IMMATURE GRANS (ABS): 0 10*3/uL (ref 0.0–0.1)
IMMATURE GRANULOCYTES: 0 %
LYMPHS: 23 %
Lymphocytes Absolute: 1.6 10*3/uL (ref 0.7–3.1)
MCH: 31.7 pg (ref 26.6–33.0)
MCHC: 33.1 g/dL (ref 31.5–35.7)
MCV: 96 fL (ref 79–97)
Monocytes Absolute: 0.8 10*3/uL (ref 0.1–0.9)
Monocytes: 12 %
NEUTROS ABS: 4.1 10*3/uL (ref 1.4–7.0)
Neutrophils: 57 %
Platelets: 205 10*3/uL (ref 150–450)
RBC: 4.41 x10E6/uL (ref 4.14–5.80)
RDW: 13.5 % (ref 12.3–15.4)
WBC: 7.1 10*3/uL (ref 3.4–10.8)

## 2018-04-22 LAB — PSA, TOTAL AND FREE
PSA FREE: 0.19 ng/mL
PSA, Free Pct: 47.5 %
Prostate Specific Ag, Serum: 0.4 ng/mL (ref 0.0–4.0)

## 2018-04-24 NOTE — Progress Notes (Signed)
Hello Joseph Shields,  Your lab result is normal.Some minor variations that are not significant are commonly marked abnormal, but do not represent any medical problem for you.  Best regards, Izaya Netherton, M.D.

## 2018-07-27 ENCOUNTER — Encounter: Payer: Medicare HMO | Admitting: *Deleted

## 2018-08-23 ENCOUNTER — Other Ambulatory Visit: Payer: Self-pay

## 2018-08-23 ENCOUNTER — Ambulatory Visit (INDEPENDENT_AMBULATORY_CARE_PROVIDER_SITE_OTHER): Payer: Medicare HMO | Admitting: Family Medicine

## 2018-08-23 DIAGNOSIS — Z0000 Encounter for general adult medical examination without abnormal findings: Secondary | ICD-10-CM

## 2018-08-23 NOTE — Patient Instructions (Signed)
Joseph Shields , Thank you for taking time to come for your Medicare Wellness Visit. I appreciate your ongoing commitment to your health goals. Please review the following plan we discussed and let me know if I can assist you in the future.   These are the goals we discussed: Goals    . Exercise 150 min/wk Moderate Activity       This is a list of the screening recommended for you and due dates:  Health Maintenance  Topic Date Due  . Tetanus Vaccine  11/02/1965  . Flu Shot  11/25/2018  . Stool Blood Test  12/06/2018  .  Hepatitis C: One time screening is recommended by Center for Disease Control  (CDC) for  adults born from [birth year] through 1964.   Completed  . Pneumonia vaccines  Completed  . Colon Cancer Screening  Discontinued      Advance Directive  Advance directives are legal documents that let you make choices ahead of time about your health care and medical treatment in case you become unable to communicate for yourself. Advance directives are a way for you to communicate your wishes to family, friends, and health care providers. This can help convey your decisions about end-of-life care if you become unable to communicate. Discussing and writing advance directives should happen over time rather than all at once. Advance directives can be changed depending on your situation and what you want, even after you have signed the advance directives. If you do not have an advance directive, some states assign family decision makers to act on your behalf based on how closely you are related to them. Each state has its own laws regarding advance directives. You may want to check with your health care provider, attorney, or state representative about the laws in your state. There are different types of advance directives, such as:  Medical power of attorney.  Living will.  Do not resuscitate (DNR) or do not attempt resuscitation (DNAR) order. Health care proxy and medical power of attorney A  health care proxy, also called a health care agent, is a person who is appointed to make medical decisions for you in cases in which you are unable to make the decisions yourself. Generally, people choose someone they know well and trust to represent their preferences. Make sure to ask this person for an agreement to act as your proxy. A proxy may have to exercise judgment in the event of a medical decision for which your wishes are not known. A medical power of attorney is a legal document that names your health care proxy. Depending on the laws in your state, after the document is written, it may also need to be:  Signed.  Notarized.  Dated.  Copied.  Witnessed.  Incorporated into your medical record. You may also want to appoint someone to manage your financial affairs in a situation in which you are unable to do so. This is called a durable power of attorney for finances. It is a separate legal document from the durable power of attorney for health care. You may choose the same person or someone different from your health care proxy to act as your agent in financial matters. If you do not appoint a proxy, or if there is a concern that the proxy is not acting in your best interests, a court-appointed guardian may be designated to act on your behalf. Living will A living will is a set of instructions documenting your wishes about medical care when you  cannot express them yourself. Health care providers should keep a copy of your living will in your medical record. You may want to give a copy to family members or friends. To alert caregivers in case of an emergency, you can place a card in your wallet to let them know that you have a living will and where they can find it. A living will is used if you become:  Terminally ill.  Incapacitated.  Unable to communicate or make decisions. Items to consider in your living will include:  The use or non-use of life-sustaining equipment, such as  dialysis machines and breathing machines (ventilators).  A DNR or DNAR order, which is the instruction not to use cardiopulmonary resuscitation (CPR) if breathing or heartbeat stops.  The use or non-use of tube feeding.  Withholding of food and fluids.  Comfort (palliative) care when the goal becomes comfort rather than a cure.  Organ and tissue donation. A living will does not give instructions for distributing your money and property if you should pass away. It is recommended that you seek the advice of a lawyer when writing a will. Decisions about taxes, beneficiaries, and asset distribution will be legally binding. This process can relieve your family and friends of any concerns surrounding disputes or questions that may come up about the distribution of your assets. DNR or DNAR A DNR or DNAR order is a request not to have CPR in the event that your heart stops beating or you stop breathing. If a DNR or DNAR order has not been made and shared, a health care provider will try to help any patient whose heart has stopped or who has stopped breathing. If you plan to have surgery, talk with your health care provider about how your DNR or DNAR order will be followed if problems occur. Summary  Advance directives are the legal documents that allow you to make choices ahead of time about your health care and medical treatment in case you become unable to communicate for yourself.  The process of discussing and writing advance directives should happen over time. You can change the advance directives, even after you have signed them.  Advance directives include DNR or DNAR orders, living wills, and designating an agent as your medical power of attorney. This information is not intended to replace advice given to you by your health care provider. Make sure you discuss any questions you have with your health care provider. Document Released: 07/20/2007 Document Revised: 03/01/2016 Document Reviewed:  03/01/2016 Elsevier Interactive Patient Education  2019 [employer].

## 2018-08-23 NOTE — Progress Notes (Signed)
MEDICARE ANNUAL WELLNESS VISIT  08/23/2018  Telephone Visit Disclaimer This Medicare AWV was conducted by telephone due to national recommendations for restrictions regarding the COVID-19 Pandemic (e.g. social distancing).  I verified, using two identifiers, that I am speaking with Joseph Shields. or their authorized healthcare agent. I discussed the limitations, risks, security, and privacy concerns of performing an evaluation and management service by telephone and the potential availability of an in-person appointment in the future. The patient expressed understanding and agreed to proceed.   Subjective:  Joseph Diones Bohr Brooke Bonito. is a 73 y.o. male patient of Stacks, Cletus Gash, MD who had a Medicare Annual Wellness Visit today via telephone. Joseph Shields is Retired and lives with their spouse. he has 2 children. he reports that he is socially active and does interact with friends/family regularly. he is moderately physically active and enjoys hunting.  Patient Care Team: [name], MD as PCP - General (Family Medicine)  Advanced Directives 07/25/2017 05/06/2014 04/14/2014 04/13/2014  Does Patient Have a Medical Advance Directive? No No No No  Would patient like information on creating a medical advance directive? Yes (MAU/Ambulatory/Procedural Areas - Information given) No - patient declined information No - patient declined information -    Hospital Utilization Over the Past 12 Months: # of hospitalizations or ER visits: 0 # of surgeries: 0  Review of Systems    Patient reports that his overall health is unchanged compared to last year.  Patient Reported Readings (BP, Pulse, CBG, Weight, etc) Weight 210  Review of Systems: General ROS: negative  All other systems negative.  Pain Assessment Pain : No/denies pain     Current Medications & Allergies (verified) Allergies as of 08/23/2018      Reactions   Flonase [fluticasone Propionate] Other (See Comments)   Nose bleeds.   Penicillins Other (See Comments)   REACTION: unknown      Medication List       Accurate as of August 23, 2018  9:20 AM. Always use your most recent med list.        aspirin EC 81 MG tablet Take 81 mg by mouth daily.   folic acid 1 MG tablet Commonly known as:  FOLVITE Take 1 tablet (1 mg total) by mouth daily.   methotrexate 2.5 MG tablet Commonly known as:  RHEUMATREX Take 4 tablets (10 mg total) by mouth once a week. Caution:Chemotherapy. Protect from light.   metoprolol succinate 25 MG 24 hr tablet Commonly known as:  TOPROL-XL TAKE 1 TABLET EVERY DAY   rosuvastatin 20 MG tablet Commonly known as:  CRESTOR TAKE 1 TABLET EVERY DAY   triamcinolone ointment 0.1 % Commonly known as:  KENALOG APPLY ONE APPLICATION TOPICALLY TWO TIMES A DAY       History (reviewed): Past Medical History:  Diagnosis Date  . CAD (coronary artery disease)    a. 03/2014 NSTEMI - initially refused cath;  b. 03/2014 Echo: EF 55-60%;  c. 04/2014 Cath: LM nl, LAD 20-30p, 92m, D1 30-40, D2 small, 60-70, LCX nl, RCA 100p (CTO), L->R collaterals, EF 55-60%-->Med Rx.  [name] COPD (chronic obstructive pulmonary disease) ([place])    a. On home O2.  [name] History of tobacco abuse    a. 40+ pack years, quit 03/2014.  [name] Hyperlipidemia   . Morbid obesity ([place])   . Psoriasis    Past Surgical History:  Procedure Laterality Date  . LEFT HEART CATHETERIZATION WITH CORONARY ANGIOGRAM N/A 05/06/2014   Procedure: LEFT HEART CATHETERIZATION WITH  CORONARY ANGIOGRAM;  Surgeon: Leonie Man, MD;  Location: [facility] CATH LAB;  Service: Cardiovascular;  Laterality: N/A;  . SPINE SURGERY     Family History  Problem Relation Age of Onset  . Stroke Mother 97  . Allergies Mother   . Asthma Mother   . Cancer Mother        colon  . Heart disease Father 3       quadruple bypass  . Healthy Daughter   . Healthy Daughter    Social History   Socioeconomic History  . Marital status: Married    Spouse name: Joseph Shields  .  Number of children: 2  . Years of education: associates degree  . Highest education level: Associate degree: occupational, Hotel manager, or vocational program  Occupational History  . Occupation: Retired    Comment: Hotel manager  Social Needs  . Financial resource strain: Not hard at all  . Food insecurity:    Worry: Never true    Inability: Never true  . Transportation needs:    Medical: No    Non-medical: No  Tobacco Use  . Smoking status: Former Smoker    Packs/day: 0.50    Years: 49.00    Pack years: 24.50    Types: Cigarettes    Last attempt to quit: 04/07/2014    Years since quitting: 4.3  . Smokeless tobacco: Never Used  Substance and Sexual Activity  . Alcohol use: No    Alcohol/week: 0.0 standard drinks  . Drug use: No  . Sexual activity: Yes  Lifestyle  . Physical activity:    Days per week: 0 days    Minutes per session: 0 min  . Stress: Not at all  Relationships  . Social connections:    Talks on phone: More than three times a week    Gets together: More than three times a week    Attends religious service: More than 4 times per year    Active member of club or organization: Yes    Attends meetings of clubs or organizations: More than 4 times per year    Relationship status: Married  Other Topics Concern  . Not on file  Social History Narrative  . Not on file    Activities of Daily Living In your present state of health, do you have any difficulty performing the following activities: 08/23/2018  Hearing? N  Vision? N  Difficulty concentrating or making decisions? N  Walking or climbing stairs? N  Dressing or bathing? N  Doing errands, shopping? N  Some recent data might be hidden    Patient Literacy How often do you need to have someone help you when you read instructions, pamphlets, or other written materials from your doctor or pharmacy?: 1 - Never What is the last grade level you completed in school?: associates degree  Exercise  Current Exercise Habits: Home exercise routine, Type of exercise: walking, Time (Minutes): 30, Frequency (Times/Week): 7, Weekly Exercise (Minutes/Week): 210, Intensity: Mild  Diet Patient reports consuming 3 meals a day and 2 snack(s) a day Patient reports that his primary diet is: Low fat Patient reports that she does have regular access to food.   Depression Screen PHQ 2/9 Scores 08/23/2018 08/23/2018 10/17/2017 07/25/2017 05/05/2017 05/10/2016 11/03/2015  PHQ - 2 Score 0 0 0 0 0 0 0     Fall Risk Fall Risk  08/23/2018 08/23/2018 08/23/2018 10/17/2017 07/25/2017  Falls in the past year? 0 0 0 No No     Objective:  Joseph Diones [place] Brooke Bonito. seemed alert and oriented and he participated appropriately during our telephone visit.  Blood Pressure Weight BMI  BP Readings from Last 3 Encounters:  04/21/18 136/74  02/28/18 132/84  10/17/17 136/77   Wt Readings from Last 3 Encounters:  04/21/18 212 lb 12.8 oz (96.5 kg)  02/28/18 211 lb 3.2 oz (95.8 kg)  10/17/17 211 lb (95.7 kg)   BMI Readings from Last 1 Encounters:  04/21/18 35.41 kg/m    *Unable to obtain current vital signs, weight, and BMI due to telephone visit type  Hearing/Vision  . Tracey did not seem to have difficulty with hearing/understanding during the telephone conversation . Reports that he has had a formal eye exam by an eye care professional within the past year . Reports that he has not had a formal hearing evaluation within the past year *Unable to fully assess hearing and vision during telephone visit type  Cognitive Function: 6CIT Screen 08/23/2018  What Year? 0 points  What month? 0 points  What time? 0 points  Count back from 20 0 points  Months in reverse 0 points  Repeat phrase 0 points  Total Score 0    Normal Cognitive Function Screening: Yes (Normal:0-7, Significant for Dysfunction: >8)  Immunization & Health Maintenance Record Immunization History  Administered Date(s) Administered  . Influenza, High Dose  Seasonal PF 04/04/2015  . Pneumococcal Conjugate-13 04/04/2015  . Pneumococcal Polysaccharide-23 10/17/2017    Health Maintenance  Topic Date Due  . TETANUS/TDAP  11/02/1965  . INFLUENZA VACCINE  11/25/2018  . COLON CANCER SCREENING ANNUAL FOBT  12/06/2018  . Hepatitis C Screening  Completed  . PNA vac Low Risk Adult  Completed  . COLONOSCOPY  Discontinued       Assessment  This is a routine wellness examination for [employer].[name]  Health Maintenance: Due or Overdue Health Maintenance Due  Topic Date Due  . Samul Dada  11/02/1965    Joseph Diones Pilger Brooke Bonito. does not need a referral for Community Assistance: Care Management:   no Social Work:    no Prescription Assistance:  no Nutrition/Diabetes Education:  no   Plan:  Personalized Goals Goals Addressed   None    Personalized Health Maintenance & Screening Recommendations  Td vaccine  Lung Cancer Screening Recommended: no (Low Dose CT Chest recommended if Age 67-80 years, 30 pack-year currently smoking OR have quit w/in past 15 years) Hepatitis C Screening recommended: no HIV Screening recommended: no  Advanced Directives: Written information was prepared per patient's request.  Referrals & Orders No orders of the defined types were placed in this encounter.   Follow-up Plan . Follow-up with [name], MD as planned . Schedule 10/23/2018 at 10:10am  I have personally reviewed and noted the following in the patient's chart:   . Medical and social history . Use of alcohol, tobacco or illicit drugs  . Current medications and supplements . Functional ability and status . Nutritional status . Physical activity . Advanced directives . List of other physicians . Hospitalizations, surgeries, and ER visits in previous 12 months . Vitals . Screenings to include cognitive, depression, and falls . Referrals and appointments  In addition, I have reviewed and discussed with Joseph Shields. certain  preventive protocols, quality metrics, and best practice recommendations. A written personalized care plan for preventive services as well as general preventive health recommendations is available and can be mailed to the patient at his request.      Truett Mainland,  LPNsignature  08/10/3843   I have reviewed and agree with the above AWV documentation.  [name], M.D.

## 2018-09-06 ENCOUNTER — Other Ambulatory Visit: Payer: Self-pay | Admitting: Family Medicine

## 2018-09-19 ENCOUNTER — Other Ambulatory Visit: Payer: Self-pay | Admitting: Family Medicine

## 2018-09-19 NOTE — Telephone Encounter (Signed)
What is the name of the medication? methotrexate (RHEUMATREX) 2.5 MG   Have you contacted your pharmacy to request a refill? Yes it was denied he runs out 10/12/2018 and has appt for 6 month f/u on 06/26  Which pharmacy would you like this sent to[facility]   Patient notified that their request is being sent to the clinical staff for review and that they should receive a call once it is complete. If they do not receive a call within 24 hours they can check with their pharmacy or our office.

## 2018-09-20 MED ORDER — METHOTREXATE 2.5 MG PO TABS: 10.0000 mg | ORAL_TABLET | ORAL | 0 refills | Status: DC

## 2018-10-19 ENCOUNTER — Other Ambulatory Visit: Payer: Self-pay | Admitting: Family Medicine

## 2018-10-20 NOTE — Telephone Encounter (Signed)
Appt scheduled on 6/29

## 2018-10-20 NOTE — Telephone Encounter (Signed)
Pt. Needs to be seen for this. Thanks, WS 

## 2018-10-23 ENCOUNTER — Ambulatory Visit (INDEPENDENT_AMBULATORY_CARE_PROVIDER_SITE_OTHER): Payer: Medicare HMO | Admitting: Family Medicine

## 2018-10-23 ENCOUNTER — Encounter: Payer: Self-pay | Admitting: Family Medicine

## 2018-10-23 ENCOUNTER — Other Ambulatory Visit: Payer: Self-pay

## 2018-10-23 DIAGNOSIS — L409 Psoriasis, unspecified: Secondary | ICD-10-CM | POA: Diagnosis not present

## 2018-10-23 DIAGNOSIS — J449 Chronic obstructive pulmonary disease, unspecified: Secondary | ICD-10-CM

## 2018-10-23 DIAGNOSIS — I10 Essential (primary) hypertension: Secondary | ICD-10-CM

## 2018-10-23 DIAGNOSIS — E782 Mixed hyperlipidemia: Secondary | ICD-10-CM

## 2018-10-23 MED ORDER — METHOTREXATE 2.5 MG PO TABS: 10.0000 mg | ORAL_TABLET | ORAL | 5 refills | Status: DC

## 2018-10-23 NOTE — Addendum Note (Signed)
Addended by: Marylin Crosby on: 10/23/2018 01:52 PM   Modules accepted: Orders

## 2018-10-23 NOTE — Progress Notes (Signed)
Subjective:    Patient ID: Joseph Shields., male    DOB: 1945-09-02, 73 y.o.   MRN: [id number]   HPI: Joseph Shields. is a 73 y.o. male presenting for patient in for follow-up of elevated cholesterol. Doing well without complaints on current medication. Denies side effects of statin including myalgia and arthralgia and nausea. Also in today for liver function testing. Currently no chest pain, shortness of breath or other cardiovascular related symptoms noted.  Patient states that he continues to take methotrexate for his psoriasis.  He has had only a couple of minor outbreaks and use triamcinolone in addition to the methotrexate during those outbreaks.  He is taking folic acid regularly due to the weekly use of methotrexate.   Follow-up of hypertension. Patient has no history of headache chest pain or shortness of breath or recent cough. Patient also denies symptoms of TIA such as numbness weakness lateralizing. Patient checks  blood pressure at home and has not had any elevated readings recently. Patient denies side effects from his medication. States taking it regularly. Getting metoprolol regularly from his cardiologist.     Depression screen [facility] 2/9 08/23/2018 08/23/2018 10/17/2017 07/25/2017 05/05/2017  Decreased Interest 0 0 0 0 0  Down, Depressed, Hopeless 0 0 0 0 0  PHQ - 2 Score 0 0 0 0 0     Relevant past medical, surgical, family and social history reviewed and updated as indicated.  Interim medical history since our last visit reviewed. Allergies and medications reviewed and updated.  ROS:  Review of Systems  Constitutional: Negative.   HENT: Negative.   Eyes: Negative for visual disturbance.  Respiratory: Negative for cough and shortness of breath.   Cardiovascular: Negative for chest pain and leg swelling.  Gastrointestinal: Negative for abdominal pain, diarrhea, nausea and vomiting.  Genitourinary: Negative for difficulty urinating.  Musculoskeletal: Negative  for arthralgias and myalgias.  Skin: Negative for rash.  Neurological: Negative for headaches.  Psychiatric/Behavioral: Negative for sleep disturbance.     Social History   Tobacco Use  Smoking Status Former Smoker  . Packs/day: 0.50  . Years: 49.00  . Pack years: 24.50  . Types: Cigarettes  . Quit date: 04/07/2014  . Years since quitting: 4.5  Smokeless Tobacco Never Used       Objective:     Wt Readings from Last 3 Encounters:  04/21/18 212 lb 12.8 oz (96.5 kg)  02/28/18 211 lb 3.2 oz (95.8 kg)  10/17/17 211 lb (95.7 kg)     Exam deferred. Pt. Harboring due to COVID 19. Phone visit performed.   Assessment & Plan:   1. Mixed hyperlipidemia   2. COPD GOLD III with mild reversiblity    3. Essential hypertension   4. Psoriasis     No orders of the defined types were placed in this encounter.   Orders Placed This Encounter  Procedures  . CMP14+EGFR    Order Specific Question:   Has the patient fasted?    Answer:   Yes  . CBC with Differential/Platelet  . Lipid panel    Order Specific Question:   Has the patient fasted?    Answer:   Yes      Diagnoses and all orders for this visit:  Mixed hyperlipidemia  COPD GOLD III with mild reversiblity  -     CMP14+EGFR -     CBC with Differential/Platelet -     Lipid panel  Essential hypertension  Psoriasis  Virtual Visit via telephone Note  I discussed the limitations, risks, security and privacy concerns of performing an evaluation and management service by telephone and the availability of in person appointments. The patient was identified with two identifiers. Pt.expressed understanding and agreed to proceed. Pt. Is at home. Dr. Livia Snellen is in his office.  Follow Up Instructions:   I discussed the assessment and treatment plan with the patient. The patient was provided an opportunity to ask questions and all were answered. The patient agreed with the plan and demonstrated an understanding of the  instructions.   The patient was advised to call back or seek an in-person evaluation if the symptoms worsen or if the condition fails to improve as anticipated.   Total minutes including chart review and phone contact time: 20   Follow up plan: Return in about 6 months (around 04/24/2019).  [name], MD [place]

## 2018-10-24 ENCOUNTER — Other Ambulatory Visit: Payer: Medicare HMO

## 2018-10-24 ENCOUNTER — Other Ambulatory Visit: Payer: Self-pay

## 2018-10-24 ENCOUNTER — Other Ambulatory Visit: Payer: Self-pay | Admitting: Family Medicine

## 2018-10-24 DIAGNOSIS — J449 Chronic obstructive pulmonary disease, unspecified: Secondary | ICD-10-CM | POA: Diagnosis not present

## 2018-10-24 LAB — LIPID PANEL

## 2018-10-24 MED ORDER — METHOTREXATE 2.5 MG PO TABS: 10.0000 mg | ORAL_TABLET | ORAL | 1 refills | Status: DC

## 2018-10-25 LAB — LIPID PANEL
Chol/HDL Ratio: 2.7 ratio (ref 0.0–5.0)
Cholesterol, Total: 139 mg/dL (ref 100–199)
HDL: 51 mg/dL (ref >39)
LDL Calculated: 69 mg/dL (ref 0–99)
Triglycerides: 95 mg/dL (ref 0–149)
VLDL Cholesterol Cal: 19 mg/dL (ref 5–40)

## 2018-10-25 LAB — CMP14+EGFR
ALT: 16 IU/L (ref 0–44)
AST: 23 IU/L (ref 0–40)
Albumin/Globulin Ratio: 2.2 (ref 1.2–2.2)
Albumin: 4.6 g/dL (ref 3.7–4.7)
Alkaline Phosphatase: 53 IU/L (ref 39–117)
BUN/Creatinine Ratio: 14 (ref 10–24)
BUN: 15 mg/dL (ref 8–27)
Bilirubin Total: 0.6 mg/dL (ref 0.0–1.2)
CO2: 24 mmol/L (ref 20–29)
Calcium: 9.3 mg/dL (ref 8.6–10.2)
Chloride: 103 mmol/L (ref 96–106)
Creatinine, Ser: 1.04 mg/dL (ref 0.76–1.27)
GFR calc Af Amer: 83 mL/min/{1.73_m2} (ref >59)
GFR calc non Af Amer: 72 mL/min/{1.73_m2} (ref >59)
Globulin, Total: 2.1 g/dL (ref 1.5–4.5)
Glucose: 106 mg/dL — ABNORMAL HIGH (ref 65–99)
Potassium: 4.6 mmol/L (ref 3.5–5.2)
Sodium: 140 mmol/L (ref 134–144)
Total Protein: 6.7 g/dL (ref 6.0–8.5)

## 2018-10-25 LAB — CBC WITH DIFFERENTIAL/PLATELET
Basophils Absolute: 0.1 10*3/uL (ref 0.0–0.2)
Basos: 1 %
EOS (ABSOLUTE): 0.5 10*3/uL — ABNORMAL HIGH (ref 0.0–0.4)
Eos: 6 %
Hematocrit: 43.1 % (ref 37.5–51.0)
Hemoglobin: 14.2 g/dL (ref 13.0–17.7)
Immature Grans (Abs): 0 10*3/uL (ref 0.0–0.1)
Immature Granulocytes: 0 %
Lymphocytes Absolute: 2 10*3/uL (ref 0.7–3.1)
Lymphs: 26 %
MCH: 30.9 pg (ref 26.6–33.0)
MCHC: 32.9 g/dL (ref 31.5–35.7)
MCV: 94 fL (ref 79–97)
Monocytes Absolute: 1.1 10*3/uL — ABNORMAL HIGH (ref 0.1–0.9)
Monocytes: 14 %
Neutrophils Absolute: 4.1 10*3/uL (ref 1.4–7.0)
Neutrophils: 53 %
Platelets: 189 10*3/uL (ref 150–450)
RBC: 4.59 x10E6/uL (ref 4.14–5.80)
RDW: 13.9 % (ref 11.6–15.4)
WBC: 7.8 10*3/uL (ref 3.4–10.8)

## 2018-10-25 NOTE — Progress Notes (Signed)
Hello Joseph Shields,  Your lab result is normal.Some minor variations that are not significant are commonly marked abnormal, but do not represent any medical problem for you.  Best regards, [name], M.D.

## 2018-11-15 ENCOUNTER — Encounter: Payer: Self-pay | Admitting: Family Medicine

## 2019-01-11 ENCOUNTER — Other Ambulatory Visit: Payer: Self-pay | Admitting: Cardiology

## 2019-01-25 ENCOUNTER — Other Ambulatory Visit: Payer: Self-pay | Admitting: Cardiology

## 2019-03-14 ENCOUNTER — Other Ambulatory Visit: Payer: Self-pay | Admitting: Cardiology

## 2019-03-14 MED ORDER — ROSUVASTATIN CALCIUM 20 MG PO TABS: 20.0000 mg | ORAL_TABLET | Freq: Every day | ORAL | 0 refills | Status: DC

## 2019-03-14 NOTE — Progress Notes (Signed)
Cardiology Office Note:    Date:  03/15/2019   ID:  Joseph Norman., DOB 04/11/1946, MRN [id number]  PCP:  [name], MD  Cardiologist:  Fransico Him, MD    Referring MD: [name], MD   Chief Complaint  Patient presents with  . Coronary Artery Disease  . Hyperlipidemia    History of Present Illness:    Joseph Tustin. is a 73 y.o. 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 He also has a history of hyperlipidemia and COPD. He was started on lipitor and stopped it due to muscle aches and remains on Crestor. He also stopped coreg because he said it made his legs swell and he is now on metoprolol.   He is here today for followup and is doing well.  He denies any chest pain or pressure, SOB, DOE, PND, orthopnea, LE edema, dizziness, palpitations or syncope. He is compliant with his meds and is tolerating meds with no SE.    Past Medical History:  Diagnosis Date  . CAD (coronary artery disease)    a. 03/2014 NSTEMI - initially refused cath;  b. 03/2014 Echo: EF 55-60%;  c. 04/2014 Cath: LM nl, LAD 20-30p, 14m, D1 30-40, D2 small, 60-70, LCX nl, RCA 100p (CTO), L->R collaterals, EF 55-60%-->Med Rx.  [name] COPD (chronic obstructive pulmonary disease) ([place])    a. On home O2.  [name] History of tobacco abuse    a. 40+ pack years, quit 03/2014.  [name] Hyperlipidemia   . Morbid obesity ([place])   . Psoriasis     Past Surgical History:  Procedure Laterality Date  . LEFT HEART CATHETERIZATION WITH CORONARY ANGIOGRAM N/A 05/06/2014   Procedure: LEFT HEART CATHETERIZATION WITH CORONARY ANGIOGRAM;  Surgeon: Leonie Man, MD;  [facility] C[facility] LAB;  Service: Cardiovascular;  Laterality: N/A;  . SPINE SURGERY      Current Medications: Current Meds  Medication Sig  . aspirin EC 81 MG tablet Take 81 mg by mouth daily.  . folic acid (FOLVITE) 1  MG tablet Take 1 tablet (1 mg total) by mouth daily.  . methotrexate (RHEUMATREX) 2.5 MG tablet Take 4 tablets (10 mg total) by mouth once a week. Caution:Chemotherapy. Protect from light.  . metoprolol succinate (TOPROL-XL) 25 MG 24 hr tablet Take 1 tablet (25 mg total) by mouth daily.  . rosuvastatin (CRESTOR) 20 MG tablet Take 1 tablet (20 mg total) by mouth daily. Please keep upcoming appt with Dr. Radford Pax in November for future refills. Thank you  . triamcinolone ointment (KENALOG) 0.1 % APPLY ONE APPLICATION TOPICALLY TWO TIMES A DAY     Allergies:   Flonase [fluticasone propionate] and Penicillins   Social History   Socioeconomic History  . Marital status: Married    Spouse name: Joseph Shields  . Number of children: 2  . Years of education: associates degree  . Highest education level: Associate degree: occupational, Hotel manager, or vocational program  Occupational History  . Occupation: Retired    Comment: Hotel manager  Social Needs  . Financial resource strain: Not hard at all  . Food insecurity    Worry: Never true    Inability: Never true  . Transportation needs    Medical: No    Non-medical: No  Tobacco Use  . Smoking status: Former Smoker    Packs/day: 0.50    Years:  49.00    Pack years: 24.50    Types: Cigarettes    Quit date: 04/07/2014    Years since quitting: 4.9  . Smokeless tobacco: Never Used  Substance and Sexual Activity  . Alcohol use: No    Alcohol/week: 0.0 standard drinks  . Drug use: No  . Sexual activity: Yes  Lifestyle  . Physical activity    Days per week: 0 days    Minutes per session: 0 min  . Stress: Not at all  Relationships  . Social connections    Talks on phone: More than three times a week    Gets together: More than three times a week    Attends religious service: More than 4 times per year    Active member of club or organization: Yes    Attends meetings of clubs or organizations: More than 4 times per year     Relationship status: Married  Other Topics Concern  . Not on file  Social History Narrative  . Not on file     Family History: The patient's family history includes Allergies in his mother; Asthma in his mother; Cancer in his mother; Healthy in his daughter and daughter; Heart disease (age of onset: 1) in his father; Stroke (age of onset: 63) in his mother.  ROS:   Please see the history of present illness.    ROS  All other systems reviewed and negative.   EKGs/Labs/Other Studies Reviewed:    The following studies were reviewed today: none  EKG:  EKG is  ordered today.  The ekg ordered today demonstrates NSR at 64bpm with no ST changes  Recent Labs: 10/24/2018: ALT 16; BUN 15; Creatinine, Ser 1.04; Hemoglobin 14.2; Platelets 189; Potassium 4.6; Sodium 140   Recent Lipid Panel    Component Value Date/Time   CHOL 139 10/24/2018 0950   TRIG 95 10/24/2018 0950   HDL 51 10/24/2018 0950   CHOLHDL 2.7 10/24/2018 0950   CHOLHDL 2.6 09/29/2015 0914   VLDL 15 09/29/2015 0914   LDLCALC 69 10/24/2018 0950   LDLDIRECT 71 05/10/2016 1639    Physical Exam:    VS:  BP (!) 144/72   Pulse 64   Ht 5\' 7"  (1.702 m)   Wt 205 lb 6.4 oz (93.2 kg)   SpO2 94%   BMI 32.17 kg/m     Wt Readings from Last 3 Encounters:  03/15/19 205 lb 6.4 oz (93.2 kg)  04/21/18 212 lb 12.8 oz (96.5 kg)  02/28/18 211 lb 3.2 oz (95.8 kg)     GEN:  Well nourished, well developed in no acute distress HEENT: Normal NECK: No JVD; No carotid bruits LYMPHATICS: No lymphadenopathy CARDIAC: RRR, no murmurs, rubs, gallops RESPIRATORY:  Clear to auscultation without rales, wheezing or rhonchi  ABDOMEN: Soft, non-tender, non-distended MUSCULOSKELETAL:  No edema; No deformity  SKIN: Warm and dry NEUROLOGIC:  Alert and oriented x 3 PSYCHIATRIC:  Normal affect   ASSESSMENT:    1. Coronary artery disease involving native coronary artery of native heart without angina pectoris   2. Pure hypercholesterolemia    3. Morbid obesity ([place])    PLAN:    In order of problems listed above:  1.  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  -denies any anginal sx -continue ASA,  statin and BB  2.  HLD -LDL goal < 70 -LDL was 69 in June 2020 -continue Crestor 20mg  daily  3.  Morbid Obesity -I have encouraged him to get into a routine exercise program and cut back on carbs and portions.    Medication Adjustments/Labs and Tests Ordered: Current medicines are reviewed at length with the patient today.  Concerns regarding medicines are outlined above.  Orders Placed This Encounter  Procedures  . EKG 12-Lead   No orders of the defined types were placed in this encounter.   Signed, Fransico Him, MD  03/15/2019 2:47 PM    [place]

## 2019-03-15 ENCOUNTER — Ambulatory Visit: Payer: Medicare HMO | Admitting: Cardiology

## 2019-03-15 ENCOUNTER — Encounter: Payer: Self-pay | Admitting: Cardiology

## 2019-03-15 ENCOUNTER — Other Ambulatory Visit: Payer: Self-pay

## 2019-03-15 VITALS — BP 144/72 | HR 64 | Ht 67.0 in | Wt 205.4 lb

## 2019-03-15 DIAGNOSIS — E7800 Pure hypercholesterolemia, unspecified: Secondary | ICD-10-CM | POA: Diagnosis not present

## 2019-03-15 DIAGNOSIS — I2510 Atherosclerotic heart disease of native coronary artery without angina pectoris: Secondary | ICD-10-CM | POA: Diagnosis not present

## 2019-03-15 NOTE — Patient Instructions (Signed)
Medication Instructions:  Your physician recommends that you continue on your current medications as directed. Please refer to the Current Medication list given to you today.  *If you need a refill on your cardiac medications before your next appointment, please call your pharmacy*  Lab Work: None ordered  If you have labs (blood work) drawn today and your tests are completely normal, you will receive your results only by: . MyChart Message (if you have MyChart) OR . A paper copy in the mail If you have any lab test that is abnormal or we need to change your treatment, we will call you to review the results.  Testing/Procedures: None ordered  Follow-Up: At [facility], you and your health needs are our priority.  As part of our continuing mission to provide you with exceptional heart care, we have created designated Provider Care Teams.  These Care Teams include your primary Cardiologist (physician) and Advanced Practice Providers (APPs -  Physician Assistants and Nurse Practitioners) who all work together to provide you with the care you need, when you need it.  Your next appointment:   12 month(s)  The format for your next appointment:   In Person  Provider:   You may see Traci Turner, MD or one of the following Advanced Practice Providers on your designated Care Team:    Dayna Dunn, PA-C  Michele Lenze, PA-C    

## 2019-04-02 ENCOUNTER — Other Ambulatory Visit: Payer: Self-pay | Admitting: Cardiology

## 2019-04-23 ENCOUNTER — Other Ambulatory Visit: Payer: Self-pay | Admitting: Family Medicine

## 2019-05-07 ENCOUNTER — Other Ambulatory Visit: Payer: Self-pay | Admitting: Family Medicine

## 2019-05-09 ENCOUNTER — Other Ambulatory Visit: Payer: Self-pay | Admitting: Cardiology

## 2019-05-16 DIAGNOSIS — Z23 Encounter for immunization: Secondary | ICD-10-CM | POA: Diagnosis not present

## 2019-07-12 ENCOUNTER — Other Ambulatory Visit: Payer: Self-pay | Admitting: *Deleted

## 2019-07-12 MED ORDER — TRIAMCINOLONE ACETONIDE 0.1 % EX OINT: TOPICAL_OINTMENT | CUTANEOUS | 2 refills | Status: DC

## 2019-08-24 ENCOUNTER — Ambulatory Visit (INDEPENDENT_AMBULATORY_CARE_PROVIDER_SITE_OTHER): Payer: Medicare HMO | Admitting: *Deleted

## 2019-08-24 VITALS — Ht 67.0 in | Wt 205.5 lb

## 2019-08-24 DIAGNOSIS — Z0000 Encounter for general adult medical examination without abnormal findings: Secondary | ICD-10-CM | POA: Diagnosis not present

## 2019-08-24 NOTE — Patient Instructions (Addendum)
[facility] Maintenance Summary and Written Plan of Care  Mr. Joseph Shields ,  Thank you for allowing me to perform your Medicare Annual Wellness Visit and for your ongoing commitment to your health.   Health Maintenance & Immunization History Health Maintenance  Topic Date Due  . TETANUS/TDAP  Never done  . COLON CANCER SCREENING ANNUAL FOBT  11/15/2019  . INFLUENZA VACCINE  11/25/2019  . COVID-19 Vaccine  Completed  . Hepatitis C Screening  Completed  . PNA vac Low Risk Adult  Completed  . COLONOSCOPY  Discontinued   Immunization History  Administered Date(s) Administered  . Influenza, High Dose Seasonal PF 04/04/2015  . Moderna SARS-COVID-2 Vaccination 05/16/2019, 06/13/2019  . Pneumococcal Conjugate-13 04/04/2015  . Pneumococcal Polysaccharide-23 10/17/2017    These are the patient goals that we discussed: Goals Addressed            This Visit's Progress   . EXERCISE FOR GENERAL HEALTH       08/24/2019 AWV Goal: Exercise for General Health   Patient will verbalize understanding of the benefits of increased physical activity:  Exercising regularly is important. It will improve your overall fitness, flexibility, and endurance.  Regular exercise also will improve your overall health. It can help you control your weight, reduce stress, and improve your bone density.  Over the next year, patient will increase physical activity as tolerated with a goal of at least 150 minutes of moderate physical activity per week.   You can tell that you are exercising at a moderate intensity if your heart starts beating faster and you start breathing faster but can still hold a conversation.  Moderate-intensity exercise ideas include:  Walking 1 mile (1.6 km) in about 15 minutes  Biking  Hiking  Golfing  Dancing  Water aerobics  Patient will verbalize understanding of everyday activities that increase physical activity by providing examples like the  following: ? Yard work, such as: ? Pushing a Conservation officer, nature ? Raking and bagging leaves ? Washing your car ? Pushing a stroller ? Shoveling snow ? Gardening ? Washing windows or floors  Patient will be able to explain general safety guidelines for exercising:   Before you start a new exercise program, talk with your health care provider.  Do not exercise so much that you hurt yourself, feel dizzy, or get very short of breath.  Wear comfortable clothes and wear shoes with good support.  Drink plenty of water while you exercise to prevent dehydration or heat stroke.  Work out until your breathing and your heartbeat get faster.     Billie Ruddy       08/24/2019 AWV Goal: Increased Socialization  . Over the next year, patient will increase socialization by talking/visiting with friends/family at least 3 times per week.  . Patient will verbalize understanding of available community resources and [employer] throughout [employer]. . Patient will regularly participate in social interaction through community centers, church, etc.  . Patient will have knowledge of the local programs offered to senior citizens that can increase activity level and socialization.         This is a list of Health Maintenance Items that are overdue or due now: Health Maintenance Due  Topic Date Due  . TETANUS/TDAP  Never done     Orders/Referrals Placed Today: No orders of the defined types were placed in this encounter.  (Contact our referral department at [phone] if you have not spoken with someone about your referral appointment  within the next 5 days)    Follow-up Plan Follow up with Dr. Livia Snellen as Schedule on 08/30/2019 Will check on price of Tetanus Vaccine at Office Visit Think About Shingles Vaccine-Information provided below You will be due for a colon cancer screening FOBT or Cologuard around 11/15/2019 Read over Advance Directive Information below and discuss with  family   Advance Directive  Advance directives are legal documents that let you make choices ahead of time about your health care and medical treatment in case you become unable to communicate for yourself. Advance directives are a way for you to make known your wishes to family, friends, and health care providers. This can let others know about your end-of-life care if you become unable to communicate. Discussing and writing advance directives should happen over time rather than all at once. Advance directives can be changed depending on your situation and what you want, even after you have signed the advance directives. There are different types of advance directives, such as:  Medical power of attorney.  Living will.  Do not resuscitate (DNR) or do not attempt resuscitation (DNAR) order. Health care proxy and medical power of attorney A health care proxy is also called a health care agent. This is a person who is appointed to make medical decisions for you in cases where you are unable to make the decisions yourself. Generally, people choose someone they know well and trust to represent their preferences. Make sure to ask this person for an agreement to act as your proxy. A proxy may have to exercise judgment in the event of a medical decision for which your wishes are not known. A medical power of attorney is a legal document that names your health care proxy. Depending on the laws in your state, after the document is written, it may also need to be:  Signed.  Notarized.  Dated.  Copied.  Witnessed.  Incorporated into your medical record. You may also want to appoint someone to manage your money in a situation in which you are unable to do so. This is called a durable power of attorney for finances. It is a separate legal document from the durable power of attorney for health care. You may choose the same person or someone different from your health care proxy to act as your agent in  money matters. If you do not appoint a proxy, or if there is a concern that the proxy is not acting in your best interests, a court may appoint a guardian to act on your behalf. Living will A living will is a set of instructions that state your wishes about medical care when you cannot express them yourself. Health care providers should keep a copy of your living will in your medical record. You may want to give a copy to family members or friends. To alert caregivers in case of an emergency, you can place a card in your wallet to let them know that you have a living will and where they can find it. A living will is used if you become:  Terminally ill.  Disabled.  Unable to communicate or make decisions. Items to consider in your living will include:  To use or not to use life-support equipment, such as dialysis machines and breathing machines (ventilators).  A DNR or DNAR order. This tells health care providers not to use cardiopulmonary resuscitation (CPR) if breathing or heartbeat stops.  To use or not to use tube feeding.  To be given or not  to be given food and fluids.  Comfort (palliative) care when the goal becomes comfort rather than a cure.  Donation of organs and tissues. A living will does not give instructions for distributing your money and property if you should pass away. DNR or DNAR A DNR or DNAR order is a request not to have CPR in the event that your heart stops beating or you stop breathing. If a DNR or DNAR order has not been made and shared, a health care provider will try to help any patient whose heart has stopped or who has stopped breathing. If you plan to have surgery, talk with your health care provider about how your DNR or DNAR order will be followed if problems occur. What if I do not have an advance directive? If you do not have an advance directive, some states assign family decision makers to act on your behalf based on how closely you are related to them.  Each state has its own laws about advance directives. You may want to check with your health care provider, attorney, or state representative about the laws in your state. Summary  Advance directives are the legal documents that allow you to make choices ahead of time about your health care and medical treatment in case you become unable to tell others about your care.  The process of discussing and writing advance directives should happen over time. You can change the advance directives, even after you have signed them.  Advance directives include DNR or DNAR orders, living wills, and designating an agent as your medical power of attorney. This information is not intended to replace advice given to you by your health care provider. Make sure you discuss any questions you have with your health care provider. Document Revised: 11/09/2018 Document Reviewed: 11/09/2018 Elsevier Patient Education  [place].    https://www.cdc.gov/vaccines/hcp/vis/vis-statements/tdap.pdf">  Tdap (Tetanus, Diphtheria, Pertussis) Vaccine: What You Need to Know 1. Why get vaccinated? Tdap vaccine can prevent tetanus, diphtheria, and pertussis. Diphtheria and pertussis spread from person to person. Tetanus enters the body through cuts or wounds.  TETANUS (T) causes painful stiffening of the muscles. Tetanus can lead to serious health problems, including being unable to open the mouth, having trouble swallowing and breathing, or death.  DIPHTHERIA (D) can lead to difficulty breathing, heart failure, paralysis, or death.  PERTUSSIS (aP), also known as "whooping cough," can cause uncontrollable, violent coughing which makes it hard to breathe, eat, or drink. Pertussis can be extremely serious in babies and young children, causing pneumonia, convulsions, brain damage, or death. In teens and adults, it can cause weight loss, loss of bladder control, passing out, and rib fractures from severe coughing. 2. Tdap  vaccine Tdap is only for children 7 years and older, adolescents, and adults.  Adolescents should receive a single dose of Tdap, preferably at age 52 or 31 years. Pregnant women should get a dose of Tdap during every pregnancy, to protect the newborn from pertussis. Infants are most at risk for severe, life-threatening complications from pertussis. Adults who have never received Tdap should get a dose of Tdap. Also, adults should receive a booster dose every 10 years, or earlier in the case of a severe and dirty wound or burn. Booster doses can be either Tdap or Td (a different vaccine that protects against tetanus and diphtheria but not pertussis). Tdap may be given at the same time as other vaccines. 3. Talk with your health care provider Tell your vaccine provider if the  person getting the vaccine:  Has had an allergic reaction after a previous dose of any vaccine that protects against tetanus, diphtheria, or pertussis, or has any severe, life-threatening allergies.  Has had a coma, decreased level of consciousness, or prolonged seizures within 7 days after a previous dose of any pertussis vaccine (DTP, DTaP, or Tdap).  Has seizures or another nervous system problem.  Has ever had Guillain-Barr Syndrome (also called GBS).  Has had severe pain or swelling after a previous dose of any vaccine that protects against tetanus or diphtheria. In some cases, your health care provider may decide to postpone Tdap vaccination to a future visit.  People with minor illnesses, such as a cold, may be vaccinated. People who are moderately or severely ill should usually wait until they recover before getting Tdap vaccine.  Your health care provider can give you more information. 4. Risks of a vaccine reaction  Pain, redness, or swelling where the shot was given, mild fever, headache, feeling tired, and nausea, vomiting, diarrhea, or stomachache sometimes happen after Tdap vaccine. People sometimes faint  after medical procedures, including vaccination. Tell your provider if you feel dizzy or have vision changes or ringing in the ears.  As with any medicine, there is a very remote chance of a vaccine causing a severe allergic reaction, other serious injury, or death. 5. What if there is a serious problem? An allergic reaction could occur after the vaccinated person leaves the clinic. If you see signs of a severe allergic reaction (hives, swelling of the face and throat, difficulty breathing, a fast heartbeat, dizziness, or weakness), call 9-1-1 and get the person to the nearest hospital. For other signs that concern you, call your health care provider.  Adverse reactions should be reported to the Vaccine Adverse Event Reporting System (VAERS). Your health care provider will usually file this report, or you can do it yourself. Visit the VAERS website at www.vaers.SamedayNews.es or call (334)679-1688. VAERS is only for reporting reactions, and VAERS staff do not give medical advice. 6. The National Vaccine Injury Compensation Program The [employer] Vaccine Injury Compensation Program (VICP) is a federal program that was created to compensate people who may have been injured by certain vaccines. Visit the VICP website at GoldCloset.com.ee or call 206-243-2598 to learn about the program and about filing a claim. There is a time limit to file a claim for compensation. 7. How can I learn more?  Ask your health care provider.  Call your local or state health department.  Contact the Centers for Disease Control and Prevention (CDC): ? Call (351) 309-9195 (1-800-CDC-INFO) or ? Visit CDC's website at http://hunter.com/ Vaccine Information Statement Tdap (Tetanus, Diphtheria, Pertussis) Vaccine (07/26/2018) This information is not intended to replace advice given to you by your health care provider. Make sure you discuss any questions you have with your health care provider. Document Revised:  08/04/2018 Document Reviewed: 08/07/2018 Elsevier Patient Education  [place].   Zoster Vaccine, Recombinant injection What is this medicine? ZOSTER VACCINE (ZOS ter vak SEEN) is used to prevent shingles in adults 73 years old and over. This vaccine is not used to treat shingles or nerve pain from shingles. This medicine may be used for other purposes; ask your health care provider or pharmacist if you have questions. COMMON BRAND NAME(S): [facility] What should I tell my health care provider before I take this medicine? They need to know if you have any of these conditions:  blood disorders or disease  cancer like leukemia  or lymphoma  immune system problems or therapy  an unusual or allergic reaction to vaccines, other medications, foods, dyes, or preservatives  pregnant or trying to get pregnant  breast-feeding How should I use this medicine? This vaccine is for injection in a muscle. It is given by a health care professional. Talk to your pediatrician regarding the use of this medicine in children. This medicine is not approved for use in children. Overdosage: If you think you have taken too much of this medicine contact a poison control center or emergency room at once. NOTE: This medicine is only for you. Do not share this medicine with others. What if I miss a dose? Keep appointments for follow-up (booster) doses as directed. It is important not to miss your dose. Call your doctor or health care professional if you are unable to keep an appointment. What may interact with this medicine?  medicines that suppress your immune system  medicines to treat cancer  steroid medicines like prednisone or cortisone This list may not describe all possible interactions. Give your health care provider a list of all the medicines, herbs, non-prescription drugs, or dietary supplements you use. Also tell them if you smoke, drink alcohol, or use illegal drugs. Some items may interact  with your medicine. What should I watch for while using this medicine? Visit your doctor for regular check ups. This vaccine, like all vaccines, may not fully protect everyone. What side effects may I notice from receiving this medicine? Side effects that you should report to your doctor or health care professional as soon as possible:  allergic reactions like skin rash, itching or hives, swelling of the face, lips, or tongue  breathing problems Side effects that usually do not require medical attention (report these to your doctor or health care professional if they continue or are bothersome):  chills  headache  fever  nausea, vomiting  redness, warmth, pain, swelling or itching at site where injected  tiredness This list may not describe all possible side effects. Call your doctor for medical advice about side effects. You may report side effects to FDA at 1-800-FDA-1088. Where should I keep my medicine? This vaccine is only given in a clinic, pharmacy, doctor's office, or other health care setting and will not be stored at home. NOTE: This sheet is a summary. It may not cover all possible information. If you have questions about this medicine, talk to your doctor, pharmacist, or health care provider.  2020 Elsevier/Gold Standard (2016-11-22 13:20:30)      Colorectal Cancer Screening  Colorectal cancer screening is a group of tests that are used to check for colorectal cancer before symptoms develop. Colorectal refers to the colon and rectum. The colon and rectum are located at the end of the digestive tract and carry bowel movements out of the body. Who should have screening? All adults starting at age 65 until age 85 should have screening. Your health care provider may recommend screening at age 53. You will have tests every 1-10 years, depending on your results and the type of screening test. You may have screening tests starting at an earlier age, or more frequently than  other people, if you have any of the following risk factors:  A personal or family history of colorectal cancer or abnormal growths (polyps).  Inflammatory bowel disease, such as ulcerative colitis or Crohn's disease.  A history of having radiation treatment to the abdomen or pelvic area for cancer.  Colorectal cancer symptoms, such as changes in  bowel habits or blood in your stool.  A type of colon cancer syndrome that is passed from parent to child (hereditary), such as: ? Lynch syndrome. ? Familial adenomatous polyposis. ? Turcot syndrome. ? Peutz-Jeghers syndrome. Screening recommendations for adults who are 32-49 years old vary depending on health. How is screening done? There are several types of colorectal screening tests. You may have one or more of the following:  Guaiac-based fecal occult blood testing. For this test, a stool (feces) sample is checked for hidden (occult) blood, which could be a sign of colorectal cancer.  Fecal immunochemical test (FIT). For this test, a stool sample is checked for blood, which could be a sign of colorectal cancer.  Stool DNA test. For this test, a stool sample is checked for blood and changes in DNA that could lead to colorectal cancer.  Sigmoidoscopy. During this test, a thin, flexible tube with a camera on the end (sigmoidoscope) is used to examine the rectum and the lower colon.  Colonoscopy. During this test, a long, flexible tube with a camera on the end (colonoscope) is used to examine the entire colon and rectum. With a colonoscopy, it is possible to take a sample of tissue (biopsy) and remove small polyps during the test.  Virtual colonoscopy. Instead of a colonoscope, this type of colonoscopy uses X-rays (CT scan) and computers to produce images of the colon and rectum. What are the benefits of screening? Screening reduces your risk for colorectal cancer and can help identify cancer at an early stage, when the cancer can be removed  or treated more easily. It is common for polyps to form in the lining of the colon, especially as you age. These polyps may be cancerous or become cancerous over time. Screening can identify these polyps. What are the risks of screening? Each screening test may have different risks.  Stool sample tests have fewer risks than other types of screening tests. However, you may need more tests to confirm results from a stool sample test.  Screening tests that involve X-rays expose you to low levels of radiation, which may slightly increase your cancer risk. The benefit of detecting cancer outweighs the slight increase in risk.  Screening tests such as sigmoidoscopy and colonoscopy may place you at risk for bleeding, intestinal damage, infection, or a reaction to medicines given during the exam. Talk with your health care provider to understand your risk for colorectal cancer and to make a screening plan that is right for you. Questions to ask your health care provider  When should I start colorectal cancer screening?  What is my risk for colorectal cancer?  How often do I need screening?  Which screening tests do I need?  How do I get my test results?  What do my results mean? Where to find more information Learn more about colorectal cancer screening from:  [employer]: www.cancer.org  [employer]: www.cancer.gov Summary  Colorectal cancer screening is a group of tests used to check for colorectal cancer before symptoms develop.  Screening reduces your risk for colorectal cancer and can help identify cancer at an early stage, when the cancer can be removed or treated more easily.  All adults starting at age 5 until age 68 should have screening. Your health care provider may recommend screening at age 12.  You may have screening tests starting at an earlier age, or more frequently than other people, if you have certain risk factors.  Talk with your  health care provider to understand your risk for colorectal cancer and to make a screening plan that is right for you. This information is not intended to replace advice given to you by your health care provider. Make sure you discuss any questions you have with your health care provider. Document Revised: 08/02/2018 Document Reviewed: 01/12/2017 Elsevier Patient Education  [place].

## 2019-08-24 NOTE — Progress Notes (Signed)
MEDICARE ANNUAL WELLNESS VISIT  08/24/2019  Telephone Visit Disclaimer This Medicare AWV was conducted by telephone due to national recommendations for restrictions regarding the COVID-19 Pandemic (e.g. social distancing).  I verified, using two identifiers, that I am speaking with Joseph Shields. or their authorized healthcare agent. I discussed the limitations, risks, security, and privacy concerns of performing an evaluation and management service by telephone and the potential availability of an in-person appointment in the future. The patient expressed understanding and agreed to proceed.   Subjective:  Joseph Shields Joseph Shields. is a 74 y.o. male patient of Stacks, Cletus Gash, MD who had a Medicare Annual Wellness Visit today via telephone. Rigel is Retired and lives with their spouse. he has 2 step children. he reports that he is not socially active and does not interact with friends/family regularly. he is not physically active and enjoys gardening.  Patient Care Team: [name], MD as PCP - General (Family Medicine) [name], MD as PCP - Cardiology (Cardiology)  Advanced Directives 08/24/2019 07/25/2017 05/06/2014 04/14/2014 04/13/2014  Does Patient Have a Medical Advance Directive? No No No No No  Would patient like information on creating a medical advance directive? Yes (MAU/Ambulatory/Procedural Areas - Information given) Yes (MAU/Ambulatory/Procedural Areas - Information given) No - patient declined information No - patient declined information -    Hospital Utilization Over the Past 12 Months: # of hospitalizations or ER visits: 0 # of surgeries: 0  Review of Systems    Patient reports that his overall health is unchanged compared to last year.  History obtained from chart review and the patient General ROS: negative  Patient Reported Readings (BP, Pulse, CBG, Weight, etc) none  Pain Assessment Pain : No/denies pain     Current Medications & Allergies  (verified) Allergies as of 08/24/2019      Reactions   Flonase [fluticasone Propionate] Other (See Comments)   Nose bleeds.   Penicillins Other (See Comments)   REACTION: unknown      Medication List       Accurate as of August 24, 2019 11:12 AM. If you have any questions, ask your nurse or doctor.        aspirin EC 81 MG tablet Take 81 mg by mouth daily.   folic acid 1 MG tablet Commonly known as: FOLVITE TAKE 1 TABLET EVERY DAY   methotrexate 2.5 MG tablet Commonly known as: RHEUMATREX TAKE 4 TABLETS ONE TIME WEEKLY  (CHEMOTHERAPY, PROTECT FROM LIGHT)   metoprolol succinate 25 MG 24 hr tablet Commonly known as: TOPROL-XL TAKE 1 TABLET EVERY DAY   rosuvastatin 20 MG tablet Commonly known as: CRESTOR Take 1 tablet (20 mg total) by mouth daily.   triamcinolone ointment 0.1 % Commonly known as: KENALOG APPLY ONE APPLICATION TOPICALLY TWO TIMES A DAY       History (reviewed): Past Medical History:  Diagnosis Date  . CAD (coronary artery disease)    a. 03/2014 NSTEMI - initially refused cath;  b. 03/2014 Echo: EF 55-60%;  c. 04/2014 Cath: LM nl, LAD 20-30p, 43m, D1 30-40, D2 small, 60-70, LCX nl, RCA 100p (CTO), L->R collaterals, EF 55-60%-->Med Rx.  [name] COPD (chronic obstructive pulmonary disease) ([place])    a. On home O2.  [name] History of tobacco abuse    a. 40+ pack years, quit 03/2014.  [name] Hyperlipidemia   . Morbid obesity ([place])   . Psoriasis    Past Surgical History:  Procedure Laterality Date  . LEFT HEART  CATHETERIZATION WITH CORONARY ANGIOGRAM N/A 05/06/2014   Procedure: LEFT HEART CATHETERIZATION WITH CORONARY ANGIOGRAM;  Surgeon: Leonie Man, MD;  Location: [facility] C[facility] LAB;  Service: Cardiovascular;  Laterality: N/A;  . SPINE SURGERY     Family History  Problem Relation Age of Onset  . Stroke Mother 96  . Allergies Mother   . Asthma Mother   . Cancer Mother        colon  . Heart disease Father 63       quadruple bypass  . Healthy Daughter   . Healthy  Daughter    Social History   Socioeconomic History  . Marital status: Married    Spouse name: Hassan Rowan  . Number of children: 2  . Years of education: associates degree  . Highest education level: Associate degree: occupational, Hotel manager, or vocational program  Occupational History  . Occupation: Retired    Comment: Hotel manager  Tobacco Use  . Smoking status: Former Smoker    Packs/day: 0.50    Years: 49.00    Pack years: 24.50    Types: Cigarettes    Quit date: 04/07/2014    Years since quitting: 5.3  . Smokeless tobacco: Never Used  Substance and Sexual Activity  . Alcohol use: No    Alcohol/week: 0.0 standard drinks  . Drug use: No  . Sexual activity: Yes  Other Topics Concern  . Not on file  Social History Narrative  . Not on file   Social Determinants of Health   Financial Resource Strain: Low Risk   . Difficulty of Paying Living Expenses: Not hard at all  Food Insecurity: No Food Insecurity  . Worried About Charity fundraiser in the Last Year: Never true  . Ran Out of Food in the Last Year: Never true  Transportation Needs: No Transportation Needs  . Lack of Transportation (Medical): No  . Lack of Transportation (Non-Medical): No  Physical Activity: Inactive  . Days of Exercise per Week: 0 days  . Minutes of Exercise per Session: 0 min  Stress: No Stress Concern Present  . Feeling of Stress : Not at all  Social Connections: Slightly Isolated  . Frequency of Communication with Friends and Family: Once a week  . Frequency of Social Gatherings with Friends and Family: Twice a week  . Attends Religious Services: More than 4 times per year  . Active Member of Clubs or Organizations: No  . Attends Archivist Meetings: Never  . Marital Status: Married    Activities of Daily Living In your present state of health, do you have any difficulty performing the following activities: 08/24/2019  Hearing? N  Vision? N  Difficulty  concentrating or making decisions? N  Walking or climbing stairs? N  Dressing or bathing? N  Doing errands, shopping? N  Preparing Food and eating ? N  Using the Toilet? N  In the past six months, have you accidently leaked urine? N  Do you have problems with loss of bowel control? N  Managing your Medications? N  Managing your Finances? N  Housekeeping or managing your Housekeeping? N  Some recent data might be hidden    Patient Education/ Literacy How often do you need to have someone help you when you read instructions, pamphlets, or other written materials from your doctor or pharmacy?: 1 - Never What is the last grade level you completed in school?: Associates Degree  Exercise Current Exercise Habits: The patient does not participate in regular exercise at  present, Exercise limited by: None identified  Diet Patient reports consuming 3 meals a day and 1 snack(s) a day Patient reports that his primary diet is: Regular Patient reports that she does have regular access to food.   Depression Screen PHQ 2/9 Scores 08/24/2019 08/23/2018 08/23/2018 10/17/2017 07/25/2017 05/05/2017 05/10/2016  PHQ - 2 Score 0 0 0 0 0 0 0     Fall Risk Fall Risk  08/24/2019 08/23/2018 08/23/2018 08/23/2018 10/17/2017  Falls in the past year? 0 0 0 0 No  Number falls in past yr: 0 - - - -  Injury with Fall? 0 - - - -  Risk for fall due to : No Fall Risks - - - -  Follow up Falls evaluation completed - - - -     Objective:  Joseph Shields Devincenzi Brooke Shields. seemed alert and oriented and he participated appropriately during our telephone visit.  Blood Pressure Weight BMI  BP Readings from Last 3 Encounters:  03/15/19 (!) 144/72  04/21/18 136/74  02/28/18 132/84   Wt Readings from Last 3 Encounters:  08/24/19 205 lb 7.5 oz (93.2 kg)  03/15/19 205 lb 6.4 oz (93.2 kg)  04/21/18 212 lb 12.8 oz (96.5 kg)   BMI Readings from Last 1 Encounters:  08/24/19 32.18 kg/m    *Unable to obtain current vital signs, weight, and  BMI due to telephone visit type  Hearing/Vision  . Mercer did not seem to have difficulty with hearing/understanding during the telephone conversation . Reports that he has not had a formal eye exam by an eye care professional within the past year . Reports that he has not had a formal hearing evaluation within the past year *Unable to fully assess hearing and vision during telephone visit type  Cognitive Function: 6CIT Screen 08/24/2019 08/23/2018  What Year? 0 points 0 points  What month? 0 points 0 points  What time? 0 points 0 points  Count back from 20 0 points 0 points  Months in reverse 0 points 0 points  Repeat phrase 0 points 0 points  Total Score 0 0   (Normal:0-7, Significant for Dysfunction: >8)  Normal Cognitive Function Screening: Yes   Immunization & Health Maintenance Record Immunization History  Administered Date(s) Administered  . Influenza, High Dose Seasonal PF 04/04/2015  . Moderna SARS-COVID-2 Vaccination 05/16/2019, 06/13/2019  . Pneumococcal Conjugate-13 04/04/2015  . Pneumococcal Polysaccharide-23 10/17/2017    Health Maintenance  Topic Date Due  . TETANUS/TDAP  Never done  . COLON CANCER SCREENING ANNUAL FOBT  11/15/2019  . INFLUENZA VACCINE  11/25/2019  . COVID-19 Vaccine  Completed  . Hepatitis C Screening  Completed  . PNA vac Low Risk Adult  Completed  . COLONOSCOPY  Discontinued       Assessment  This is a routine wellness examination for [employer].[name]  Health Maintenance: Due or Overdue Health Maintenance Due  Topic Date Due  . TETANUS/TDAP  Never done    Joseph Shields. does not need a referral for Community Assistance: Care Management:   no Social Work:    no Prescription Assistance:  no Nutrition/Diabetes Education:  no   Plan:  Personalized Goals Goals Addressed            This Visit's Progress   . EXERCISE FOR GENERAL HEALTH       08/24/2019 AWV Goal: Exercise for General Health   Patient will  verbalize understanding of the benefits of increased physical activity:  Exercising regularly is important.  It will improve your overall fitness, flexibility, and endurance.  Regular exercise also will improve your overall health. It can help you control your weight, reduce stress, and improve your bone density.  Over the next year, patient will increase physical activity as tolerated with a goal of at least 150 minutes of moderate physical activity per week.   You can tell that you are exercising at a moderate intensity if your heart starts beating faster and you start breathing faster but can still hold a conversation.  Moderate-intensity exercise ideas include:  Walking 1 mile (1.6 km) in about 15 minutes  Biking  Hiking  Golfing  Dancing  Water aerobics  Patient will verbalize understanding of everyday activities that increase physical activity by providing examples like the following: ? Yard work, such as: ? Pushing a Conservation officer, nature ? Raking and bagging leaves ? Washing your car ? Pushing a stroller ? Shoveling snow ? Gardening ? Washing windows or floors  Patient will be able to explain general safety guidelines for exercising:   Before you start a new exercise program, talk with your health care provider.  Do not exercise so much that you hurt yourself, feel dizzy, or get very short of breath.  Wear comfortable clothes and wear shoes with good support.  Drink plenty of water while you exercise to prevent dehydration or heat stroke.  Work out until your breathing and your heartbeat get faster.     Billie Ruddy       08/24/2019 AWV Goal: Increased Socialization  . Over the next year, patient will increase socialization by talking/visiting with friends/family at least 3 times per week.  . Patient will verbalize understanding of available community resources and [employer] throughout [employer]. . Patient will regularly participate in social interaction  through community centers, church, etc.  . Patient will have knowledge of the local programs offered to senior citizens that can increase activity level and socialization.       Personalized Health Maintenance & Screening Recommendations  Td vaccine Advanced directives: has NO advanced directive  - add't info requested. Referral to SW: no  Lung Cancer Screening Recommended: no (Low Dose CT Chest recommended if Age 11-80 years, 30 pack-year currently smoking OR have quit w/in past 15 years) Hepatitis C Screening recommended: no HIV Screening recommended: no  Advanced Directives: Written information was prepared per patient's request.  Referrals & Orders No orders of the defined types were placed in this encounter.   Follow-up Plan . Follow-up with [name], MD as planned . Schedule Eye Exam . Will check on the price of Tdap (Boostrix) at office visit . Patient to read over information given on shingrix . Patient to read over advance directive information given and discuss with family . Patient will be due for Colon Cancer Screening (FOBT or Cologuard around 11/15/2019)   I have personally reviewed and noted the following in the patient's chart:   . Medical and social history . Use of alcohol, tobacco or illicit drugs  . Current medications and supplements . Functional ability and status . Nutritional status . Physical activity . Advanced directives . List of other physicians . Hospitalizations, surgeries, and ER visits in previous 12 months . Vitals . Screenings to include cognitive, depression, and falls . Referrals and appointments  In addition, I have reviewed and discussed with Joseph Shields. certain preventive protocols, quality metrics, and best practice recommendations. A written personalized care plan for preventive services as well as general preventive  health recommendations is available and can be mailed to the patient at his request.      Wardell Heath, LPN  [id number]  AVS printed and mailed to patient

## 2019-08-30 ENCOUNTER — Other Ambulatory Visit: Payer: Self-pay

## 2019-08-30 ENCOUNTER — Ambulatory Visit (INDEPENDENT_AMBULATORY_CARE_PROVIDER_SITE_OTHER): Payer: Medicare HMO | Admitting: Family Medicine

## 2019-08-30 ENCOUNTER — Encounter: Payer: Self-pay | Admitting: Family Medicine

## 2019-08-30 VITALS — BP 132/88 | HR 61 | Temp 97.6°F | Ht 67.0 in | Wt 209.0 lb

## 2019-08-30 DIAGNOSIS — L409 Psoriasis, unspecified: Secondary | ICD-10-CM | POA: Diagnosis not present

## 2019-08-30 DIAGNOSIS — I25118 Atherosclerotic heart disease of native coronary artery with other forms of angina pectoris: Secondary | ICD-10-CM

## 2019-08-30 DIAGNOSIS — Z125 Encounter for screening for malignant neoplasm of prostate: Secondary | ICD-10-CM

## 2019-08-30 DIAGNOSIS — I10 Essential (primary) hypertension: Secondary | ICD-10-CM | POA: Diagnosis not present

## 2019-08-30 DIAGNOSIS — J449 Chronic obstructive pulmonary disease, unspecified: Secondary | ICD-10-CM

## 2019-08-30 DIAGNOSIS — E782 Mixed hyperlipidemia: Secondary | ICD-10-CM

## 2019-08-30 MED ORDER — PYRIDOXINE HCL 50 MG PO TABS: 50.0000 mg | ORAL_TABLET | Freq: Every day | ORAL | 3 refills | Status: DC

## 2019-08-30 MED ORDER — METOPROLOL SUCCINATE ER 25 MG PO TB24: 25.0000 mg | ORAL_TABLET | Freq: Every day | ORAL | 1 refills | Status: DC

## 2019-08-30 MED ORDER — FOLIC ACID 1 MG PO TABS: 1.0000 mg | ORAL_TABLET | Freq: Every day | ORAL | 3 refills | Status: DC

## 2019-08-30 MED ORDER — TRIAMCINOLONE ACETONIDE 0.1 % EX OINT: TOPICAL_OINTMENT | CUTANEOUS | 2 refills | Status: DC

## 2019-08-30 MED ORDER — METHOTREXATE 2.5 MG PO TABS: ORAL_TABLET | ORAL | 1 refills | Status: DC

## 2019-08-30 MED ORDER — ROSUVASTATIN CALCIUM 20 MG PO TABS: 20.0000 mg | ORAL_TABLET | Freq: Every day | ORAL | 3 refills | Status: DC

## 2019-08-30 NOTE — Progress Notes (Signed)
Subjective:  Patient ID: Joseph Shields., male    DOB: 03-Mar-1946  Age: 74 y.o. MRN: [id number]  CC: Follow-up (6 month)   HPI Joseph Shields. presents for follow-up of elevated cholesterol. Doing well without complaints on current medication. Denies side effects of statin including myalgia and arthralgia and nausea. Also in today for liver function testing. Currently no chest pain, shortness of breath or other cardiovascular related symptoms noted.  Patient is also followed for his coronary artery disease.  He has had no symptoms recently.  Specifically no chest pain.  No excessive fatigue.  And no shortness of breath.   Follow-up of hypertension. Patient has no history of headache chest pain or shortness of breath or recent cough. Patient also denies symptoms of TIA such as numbness weakness lateralizing. Patient checks  blood pressure at home and has not had any elevated readings recently. Patient denies side effects from his medication. States taking it regularly.  Patient is also followed for psoriasis.  He does not does well as long as he takes the methotrexate.  He is in for refills of that medicine as the disease is in remission.  He also needs liver function testing. History Joseph Shields has a past medical history of CAD (coronary artery disease), COPD (chronic obstructive pulmonary disease) ([place]), History of tobacco abuse, Hyperlipidemia, Morbid obesity ([place]), and Psoriasis.   He has a past surgical history that includes Spine surgery and left heart catheterization with coronary angiogram (N/A, 05/06/2014).   His family history includes Allergies in his mother; Asthma in his mother; Cancer in his mother; Healthy in his daughter and daughter; Heart disease (age of onset: 83) in his father; Stroke (age of onset: 50) in his mother.He reports that he quit smoking about 5 years ago. His smoking use included cigarettes. He has a 24.50 pack-year smoking history. He has never used smokeless  tobacco. He reports that he does not drink alcohol or use drugs.  Current Outpatient Medications on File Prior to Visit  Medication Sig Dispense Refill  . aspirin EC 81 MG tablet Take 81 mg by mouth daily.     No current facility-administered medications on file prior to visit.    ROS Review of Systems  Constitutional: Negative for fever.  Respiratory: Negative for shortness of breath.   Cardiovascular: Negative for chest pain.  Musculoskeletal: Negative for arthralgias.  Skin: Negative for rash.    Objective:  BP 132/88   Pulse 61   Temp 97.6 F (36.4 C) (Temporal)   Ht 5' 7"  (1.702 m)   Wt 209 lb (94.8 kg)   BMI 32.73 kg/m   BP Readings from Last 3 Encounters:  08/30/19 132/88  03/15/19 (!) 144/72  04/21/18 136/74    Wt Readings from Last 3 Encounters:  08/30/19 209 lb (94.8 kg)  08/24/19 205 lb 7.5 oz (93.2 kg)  03/15/19 205 lb 6.4 oz (93.2 kg)     Physical Exam Vitals reviewed.  Constitutional:      Appearance: He is well-developed.  HENT:     Head: Normocephalic and atraumatic.     Right Ear: External ear normal.     Left Ear: External ear normal.     Mouth/Throat:     Pharynx: No oropharyngeal exudate or posterior oropharyngeal erythema.  Eyes:     Pupils: Pupils are equal, round, and reactive to light.  Cardiovascular:     Rate and Rhythm: Normal rate and regular rhythm.     Heart sounds: No  murmur.  Pulmonary:     Effort: No respiratory distress.     Breath sounds: Normal breath sounds.  Musculoskeletal:     Cervical back: Normal range of motion and neck supple.  Neurological:     Mental Status: He is alert and oriented to person, place, and time.     Lab Results  Component Value Date   HGBA1C 5.7 04/21/2018   HGBA1C 5.9 10/17/2017   HGBA1C 5.4 05/05/2017    Lab Results  Component Value Date   WBC 7.1 08/30/2019   HGB 15.0 08/30/2019   HCT 45.0 08/30/2019   PLT 176 08/30/2019   GLUCOSE 91 08/30/2019   CHOL 137 08/30/2019   TRIG  80 08/30/2019   HDL 51 08/30/2019   LDLDIRECT 71 05/10/2016   LDLCALC 70 08/30/2019   ALT 17 08/30/2019   AST 26 08/30/2019   NA 142 08/30/2019   K 5.1 08/30/2019   CL 104 08/30/2019   CREATININE 1.01 08/30/2019   BUN 12 08/30/2019   CO2 24 08/30/2019   TSH 1.380 10/17/2017   INR 1.0 05/03/2014   HGBA1C 5.7 04/21/2018    DG Chest 2 View  Result Date: 02/25/2016 CLINICAL DATA:  Cough for 2 weeks. EXAM: CHEST  2 VIEW COMPARISON:  Radiographs of February 19, 2014. FINDINGS: The heart size and mediastinal contours are within normal limits. Both lungs are clear. No pneumothorax or pleural effusion is noted. The visualized skeletal structures are unremarkable. IMPRESSION: No active cardiopulmonary disease. Electronically Signed   By: Marijo Conception, M.D.   On: 02/25/2016 11:16    Assessment & Plan:   Joseph Shields was seen today for follow-up.  Diagnoses and all orders for this visit:  Coronary artery disease of native artery of native heart with stable angina pectoris (HCC) -     metoprolol succinate (TOPROL-XL) 25 MG 24 hr tablet; Take 1 tablet (25 mg total) by mouth daily. -     CBC with Differential/Platelet -     CMP14+EGFR  Psoriasis -     methotrexate (RHEUMATREX) 2.5 MG tablet; TAKE 4 TABLETS ONE TIME WEEKLY  (CHEMOTHERAPY, PROTECT FROM LIGHT) -     folic acid (FOLVITE) 1 MG tablet; Take 1 tablet (1 mg total) by mouth daily. -     triamcinolone ointment (KENALOG) 0.1 %; APPLY ONE APPLICATION TOPICALLY TWO TIMES A DAY -     CBC with Differential/Platelet -     CMP14+EGFR  COPD mixed type (HCC) -     CBC with Differential/Platelet -     CMP14+EGFR  Essential hypertension -     metoprolol succinate (TOPROL-XL) 25 MG 24 hr tablet; Take 1 tablet (25 mg total) by mouth daily. -     CBC with Differential/Platelet -     CMP14+EGFR  Screening for prostate cancer -     CBC with Differential/Platelet -     CMP14+EGFR -     PSA Total (Reflex To Free)  Mixed hyperlipidemia -      rosuvastatin (CRESTOR) 20 MG tablet; Take 1 tablet (20 mg total) by mouth daily. -     CBC with Differential/Platelet -     CMP14+EGFR -     Lipid panel  Other orders -     pyridOXINE (B-6) 50 MG tablet; Take 1 tablet (50 mg total) by mouth daily.   I have changed Ebrima L. Bussa Jr.'s metoprolol succinate and folic acid. I am also having him start on pyridOXINE. Additionally, I am having him maintain  his aspirin EC, methotrexate, rosuvastatin, and triamcinolone ointment.  Meds ordered this encounter  Medications  . methotrexate (RHEUMATREX) 2.5 MG tablet    Sig: TAKE 4 TABLETS ONE TIME WEEKLY  (CHEMOTHERAPY, PROTECT FROM LIGHT)    Dispense:  52 tablet    Refill:  1  . metoprolol succinate (TOPROL-XL) 25 MG 24 hr tablet    Sig: Take 1 tablet (25 mg total) by mouth daily.    Dispense:  90 tablet    Refill:  1  . rosuvastatin (CRESTOR) 20 MG tablet    Sig: Take 1 tablet (20 mg total) by mouth daily.    Dispense:  90 tablet    Refill:  3  . folic acid (FOLVITE) 1 MG tablet    Sig: Take 1 tablet (1 mg total) by mouth daily.    Dispense:  90 tablet    Refill:  3  . triamcinolone ointment (KENALOG) 0.1 %    Sig: APPLY ONE APPLICATION TOPICALLY TWO TIMES A DAY    Dispense:  454 g    Refill:  2    Please consider 90 day supplies to promote better adherence  . pyridOXINE (B-6) 50 MG tablet    Sig: Take 1 tablet (50 mg total) by mouth daily.    Dispense:  90 tablet    Refill:  3     Follow-up: Return in about 6 months (around 03/01/2020), or if symptoms worsen or fail to improve.  [name], M.D.

## 2019-08-31 LAB — CMP14+EGFR
ALT: 17 IU/L (ref 0–44)
AST: 26 IU/L (ref 0–40)
Albumin/Globulin Ratio: 1.7 (ref 1.2–2.2)
Albumin: 4.5 g/dL (ref 3.7–4.7)
Alkaline Phosphatase: 65 IU/L (ref 39–117)
BUN/Creatinine Ratio: 12 (ref 10–24)
BUN: 12 mg/dL (ref 8–27)
Bilirubin Total: 0.7 mg/dL (ref 0.0–1.2)
CO2: 24 mmol/L (ref 20–29)
Calcium: 9.3 mg/dL (ref 8.6–10.2)
Chloride: 104 mmol/L (ref 96–106)
Creatinine, Ser: 1.01 mg/dL (ref 0.76–1.27)
GFR calc Af Amer: 86 mL/min/{1.73_m2} (ref >59)
GFR calc non Af Amer: 74 mL/min/{1.73_m2} (ref >59)
Globulin, Total: 2.7 g/dL (ref 1.5–4.5)
Glucose: 91 mg/dL (ref 65–99)
Potassium: 5.1 mmol/L (ref 3.5–5.2)
Sodium: 142 mmol/L (ref 134–144)
Total Protein: 7.2 g/dL (ref 6.0–8.5)

## 2019-08-31 LAB — CBC WITH DIFFERENTIAL/PLATELET
Basophils Absolute: 0 10*3/uL (ref 0.0–0.2)
Basos: 0 %
EOS (ABSOLUTE): 0.5 10*3/uL — ABNORMAL HIGH (ref 0.0–0.4)
Eos: 6 %
Hematocrit: 45 % (ref 37.5–51.0)
Hemoglobin: 15 g/dL (ref 13.0–17.7)
Immature Grans (Abs): 0 10*3/uL (ref 0.0–0.1)
Immature Granulocytes: 0 %
Lymphocytes Absolute: 1.9 10*3/uL (ref 0.7–3.1)
Lymphs: 26 %
MCH: 31.8 pg (ref 26.6–33.0)
MCHC: 33.3 g/dL (ref 31.5–35.7)
MCV: 95 fL (ref 79–97)
Monocytes Absolute: 1 10*3/uL — ABNORMAL HIGH (ref 0.1–0.9)
Monocytes: 15 %
Neutrophils Absolute: 3.7 10*3/uL (ref 1.4–7.0)
Neutrophils: 53 %
Platelets: 176 10*3/uL (ref 150–450)
RBC: 4.72 x10E6/uL (ref 4.14–5.80)
RDW: 13.5 % (ref 11.6–15.4)
WBC: 7.1 10*3/uL (ref 3.4–10.8)

## 2019-08-31 LAB — LIPID PANEL
Chol/HDL Ratio: 2.7 ratio (ref 0.0–5.0)
Cholesterol, Total: 137 mg/dL (ref 100–199)
HDL: 51 mg/dL (ref >39)
LDL Chol Calc (NIH): 70 mg/dL (ref 0–99)
Triglycerides: 80 mg/dL (ref 0–149)
VLDL Cholesterol Cal: 16 mg/dL (ref 5–40)

## 2019-08-31 LAB — PSA TOTAL (REFLEX TO FREE): Prostate Specific Ag, Serum: 0.4 ng/mL (ref 0.0–4.0)

## 2019-09-03 ENCOUNTER — Encounter: Payer: Self-pay | Admitting: Family Medicine

## 2019-09-03 NOTE — Progress Notes (Signed)
Hello Cru,  Your lab result is normal and/or stable.Some minor variations that are not significant are commonly marked abnormal, but do not represent any medical problem for you.  Best regards, Lysandra Loughmiller, M.D.

## 2019-09-04 ENCOUNTER — Other Ambulatory Visit: Payer: Self-pay | Admitting: Cardiology

## 2019-09-04 DIAGNOSIS — I10 Essential (primary) hypertension: Secondary | ICD-10-CM

## 2019-09-04 DIAGNOSIS — I25118 Atherosclerotic heart disease of native coronary artery with other forms of angina pectoris: Secondary | ICD-10-CM

## 2019-09-21 ENCOUNTER — Other Ambulatory Visit: Payer: Self-pay

## 2019-09-21 DIAGNOSIS — I25118 Atherosclerotic heart disease of native coronary artery with other forms of angina pectoris: Secondary | ICD-10-CM

## 2019-09-21 DIAGNOSIS — I10 Essential (primary) hypertension: Secondary | ICD-10-CM

## 2019-09-21 MED ORDER — METOPROLOL SUCCINATE ER 25 MG PO TB24: 25.0000 mg | ORAL_TABLET | Freq: Every day | ORAL | 1 refills | Status: DC

## 2019-09-26 ENCOUNTER — Other Ambulatory Visit: Payer: Self-pay | Admitting: Family Medicine

## 2019-09-26 DIAGNOSIS — L409 Psoriasis, unspecified: Secondary | ICD-10-CM

## 2019-10-02 ENCOUNTER — Other Ambulatory Visit: Payer: Self-pay | Admitting: Family Medicine

## 2019-10-02 DIAGNOSIS — L409 Psoriasis, unspecified: Secondary | ICD-10-CM

## 2020-01-18 ENCOUNTER — Other Ambulatory Visit: Payer: Self-pay | Admitting: Family Medicine

## 2020-01-18 DIAGNOSIS — L409 Psoriasis, unspecified: Secondary | ICD-10-CM

## 2020-02-08 ENCOUNTER — Other Ambulatory Visit: Payer: Self-pay | Admitting: Cardiology

## 2020-02-08 DIAGNOSIS — I25118 Atherosclerotic heart disease of native coronary artery with other forms of angina pectoris: Secondary | ICD-10-CM

## 2020-02-08 DIAGNOSIS — I10 Essential (primary) hypertension: Secondary | ICD-10-CM

## 2020-02-21 DIAGNOSIS — L821 Other seborrheic keratosis: Secondary | ICD-10-CM | POA: Diagnosis not present

## 2020-02-21 DIAGNOSIS — D485 Neoplasm of uncertain behavior of skin: Secondary | ICD-10-CM | POA: Diagnosis not present

## 2020-02-21 DIAGNOSIS — C44319 Basal cell carcinoma of skin of other parts of face: Secondary | ICD-10-CM | POA: Diagnosis not present

## 2020-02-28 DIAGNOSIS — H04123 Dry eye syndrome of bilateral lacrimal glands: Secondary | ICD-10-CM | POA: Diagnosis not present

## 2020-03-03 ENCOUNTER — Ambulatory Visit (INDEPENDENT_AMBULATORY_CARE_PROVIDER_SITE_OTHER): Payer: Medicare HMO | Admitting: Family Medicine

## 2020-03-03 ENCOUNTER — Encounter: Payer: Self-pay | Admitting: Family Medicine

## 2020-03-03 ENCOUNTER — Other Ambulatory Visit: Payer: Self-pay

## 2020-03-03 VITALS — BP 130/63 | HR 60 | Temp 97.8°F | Ht 67.0 in | Wt 209.8 lb

## 2020-03-03 DIAGNOSIS — E782 Mixed hyperlipidemia: Secondary | ICD-10-CM | POA: Diagnosis not present

## 2020-03-03 DIAGNOSIS — Z1211 Encounter for screening for malignant neoplasm of colon: Secondary | ICD-10-CM

## 2020-03-03 DIAGNOSIS — I25118 Atherosclerotic heart disease of native coronary artery with other forms of angina pectoris: Secondary | ICD-10-CM

## 2020-03-03 DIAGNOSIS — L409 Psoriasis, unspecified: Secondary | ICD-10-CM | POA: Diagnosis not present

## 2020-03-03 DIAGNOSIS — Z7189 Other specified counseling: Secondary | ICD-10-CM | POA: Diagnosis not present

## 2020-03-03 DIAGNOSIS — I10 Essential (primary) hypertension: Secondary | ICD-10-CM

## 2020-03-03 LAB — CMP14+EGFR
ALT: 17 IU/L (ref 0–44)
AST: 24 IU/L (ref 0–40)
Albumin/Globulin Ratio: 1.8 (ref 1.2–2.2)
Albumin: 4.4 g/dL (ref 3.7–4.7)
Alkaline Phosphatase: 58 IU/L (ref 44–121)
BUN/Creatinine Ratio: 17 (ref 10–24)
BUN: 18 mg/dL (ref 8–27)
Bilirubin Total: 0.6 mg/dL (ref 0.0–1.2)
CO2: 25 mmol/L (ref 20–29)
Calcium: 9.2 mg/dL (ref 8.6–10.2)
Chloride: 103 mmol/L (ref 96–106)
Creatinine, Ser: 1.03 mg/dL (ref 0.76–1.27)
GFR calc Af Amer: 83 mL/min/{1.73_m2} (ref >59)
GFR calc non Af Amer: 72 mL/min/{1.73_m2} (ref >59)
Globulin, Total: 2.5 g/dL (ref 1.5–4.5)
Glucose: 98 mg/dL (ref 65–99)
Potassium: 5 mmol/L (ref 3.5–5.2)
Sodium: 139 mmol/L (ref 134–144)
Total Protein: 6.9 g/dL (ref 6.0–8.5)

## 2020-03-03 LAB — CBC WITH DIFFERENTIAL/PLATELET
Basophils Absolute: 0.1 10*3/uL (ref 0.0–0.2)
Basos: 1 %
EOS (ABSOLUTE): 0.5 10*3/uL — ABNORMAL HIGH (ref 0.0–0.4)
Eos: 6 %
Hematocrit: 45 % (ref 37.5–51.0)
Hemoglobin: 14.7 g/dL (ref 13.0–17.7)
Immature Grans (Abs): 0 10*3/uL (ref 0.0–0.1)
Immature Granulocytes: 0 %
Lymphocytes Absolute: 1.8 10*3/uL (ref 0.7–3.1)
Lymphs: 25 %
MCH: 31.1 pg (ref 26.6–33.0)
MCHC: 32.7 g/dL (ref 31.5–35.7)
MCV: 95 fL (ref 79–97)
Monocytes Absolute: 1 10*3/uL — ABNORMAL HIGH (ref 0.1–0.9)
Monocytes: 14 %
Neutrophils Absolute: 3.9 10*3/uL (ref 1.4–7.0)
Neutrophils: 54 %
Platelets: 176 10*3/uL (ref 150–450)
RBC: 4.73 x10E6/uL (ref 4.14–5.80)
RDW: 13.2 % (ref 11.6–15.4)
WBC: 7.2 10*3/uL (ref 3.4–10.8)

## 2020-03-03 LAB — LIPID PANEL
Chol/HDL Ratio: 2.7 ratio (ref 0.0–5.0)
Cholesterol, Total: 133 mg/dL (ref 100–199)
HDL: 49 mg/dL (ref >39)
LDL Chol Calc (NIH): 69 mg/dL (ref 0–99)
Triglycerides: 75 mg/dL (ref 0–149)
VLDL Cholesterol Cal: 15 mg/dL (ref 5–40)

## 2020-03-03 MED ORDER — METHOTREXATE SODIUM 2.5 MG PO TABS: ORAL_TABLET | ORAL | 1 refills | Status: DC

## 2020-03-03 NOTE — Progress Notes (Signed)
Subjective:  Patient ID: Joseph Norman., male    DOB: [birth year]  Age: 74 y.o. MRN: [id number]  CC: Follow-up (6 month)   HPI Joseph Norman. presents for follow-up of elevated cholesterol. Doing well without complaints on current medication. Denies side effects of statin including myalgia and arthralgia and nausea. Also in today for liver function testing. Currently no chest pain, shortness of breath or other cardiovascular related symptoms noted.  Patient states that he had the Covid vaccine in January and February.  Subsequently he was diagnosed with cataracts that had not been there there before.  Additionally he had shingles on his left forehead last year to try to get into his eye.  It was taking care of before that happened.  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  He has to go in for a wider excision of it soon.  Patient expresses concern about taking the Covid booster as result of these things.  Patient continues to take methotrexate for his psoriasis.  He is working quite well.  He denies any side effects from the medication.  He does take pyridoxine and folate History Joseph Shields has a past medical history of CAD (coronary artery disease), COPD (chronic obstructive pulmonary disease) ([place]), History of tobacco abuse, Hyperlipidemia, Morbid obesity ([place]), and Psoriasis.   He has a past surgical history that includes Spine surgery and left heart catheterization with coronary angiogram (N/A, 05/06/2014).   His family history includes Allergies in his mother; Asthma in his mother; Cancer in his mother; Healthy in his daughter and daughter; Heart disease (age of onset: 23) in his father; Stroke (age of onset: 6) in his mother.He reports that he quit smoking about 5 years ago. His smoking use included cigarettes. He has a 24.50 pack-year smoking history. He has never used smokeless  tobacco. He reports that he does not drink alcohol and does not use drugs.  Current Outpatient Medications on File Prior to Visit  Medication Sig Dispense Refill  . aspirin EC 81 MG tablet Take 81 mg by mouth daily.    . folic acid (FOLVITE) 1 MG tablet Take 1 tablet (1 mg total) by mouth daily. 90 tablet 3  . metoprolol succinate (TOPROL-XL) 25 MG 24 hr tablet TAKE 1 TABLET EVERY DAY 90 tablet 0  . pyridOXINE (B-6) 50 MG tablet Take 1 tablet (50 mg total) by mouth daily. 90 tablet 3  . rosuvastatin (CRESTOR) 20 MG tablet Take 1 tablet (20 mg total) by mouth daily. 90 tablet 3  . triamcinolone ointment (KENALOG) 0.1 % APPLY ONE APPLICATION TOPICALLY TWO TIMES A DAY 454 g 2   No current facility-administered medications on file prior to visit.    ROS Review of Systems  Constitutional: Negative for fever.  Respiratory: Negative for shortness of breath.   Cardiovascular: Negative for chest pain.  Musculoskeletal: Negative for arthralgias.  Skin: Negative for rash.    Objective:  BP 130/63   Pulse 60   Temp 97.8 F (36.6 C) (Temporal)   Ht 5' 7"  (1.702 m)   Wt 209 lb 12.8 oz (95.2 kg)   BMI 32.86 kg/m   BP Readings from Last 3 Encounters:  03/03/20 130/63  08/30/19 132/88  03/15/19 (!) 144/72    Wt Readings from Last 3 Encounters:  03/03/20 209 lb 12.8 oz (95.2 kg)  08/30/19 209 lb (94.8 kg)  08/24/19  205 lb 7.5 oz (93.2 kg)     Physical Exam Vitals reviewed.  Constitutional:      Appearance: He is well-developed.  HENT:     Head: Normocephalic and atraumatic.     Right Ear: Tympanic membrane and external ear normal. No decreased hearing noted.     Left Ear: Tympanic membrane and external ear normal. No decreased hearing noted.     Mouth/Throat:     Pharynx: No oropharyngeal exudate or posterior oropharyngeal erythema.  Eyes:     Pupils: Pupils are equal, round, and reactive to light.  Cardiovascular:     Rate and Rhythm: Normal rate and regular rhythm.      Heart sounds: No murmur heard.   Pulmonary:     Effort: No respiratory distress.     Breath sounds: Normal breath sounds.  Abdominal:     General: Bowel sounds are normal.     Palpations: Abdomen is soft. There is no mass.     Tenderness: There is no abdominal tenderness.  Musculoskeletal:     Cervical back: Normal range of motion and neck supple.     Lab Results  Component Value Date   HGBA1C 5.7 04/21/2018   HGBA1C 5.9 10/17/2017   HGBA1C 5.4 05/05/2017    Lab Results  Component Value Date   WBC 7.1 08/30/2019   HGB 15.0 08/30/2019   HCT 45.0 08/30/2019   PLT 176 08/30/2019   GLUCOSE 91 08/30/2019   CHOL 137 08/30/2019   TRIG 80 08/30/2019   HDL 51 08/30/2019   LDLDIRECT 71 05/10/2016   LDLCALC 70 08/30/2019   ALT 17 08/30/2019   AST 26 08/30/2019   NA 142 08/30/2019   K 5.1 08/30/2019   CL 104 08/30/2019   CREATININE 1.01 08/30/2019   BUN 12 08/30/2019   CO2 24 08/30/2019   TSH 1.380 10/17/2017   INR 1.0 05/03/2014   HGBA1C 5.7 04/21/2018    DG Chest 2 View  Result Date: 02/25/2016 CLINICAL DATA:  Cough for 2 weeks. EXAM: CHEST  2 VIEW COMPARISON:  Radiographs of February 19, 2014. FINDINGS: The heart size and mediastinal contours are within normal limits. Both lungs are clear. No pneumothorax or pleural effusion is noted. The visualized skeletal structures are unremarkable. IMPRESSION: No active cardiopulmonary disease. Electronically Signed   By: Marijo Conception, M.D.   On: 02/25/2016 11:16    Assessment & Plan:   Joseph Shields was seen today for follow-up.  Diagnoses and all orders for this visit:  Essential hypertension -     CBC with Differential/Platelet -     CMP14+EGFR  Psoriasis -     methotrexate 2.5 MG tablet; TAKE 4 TABLETS ONE TIME WEEKLY (CAUTION: CHEMOTHERAPY. PROTECT FROM LIGHT) -     CBC with Differential/Platelet -     CMP14+EGFR  Mixed hyperlipidemia -     CBC with Differential/Platelet -     CMP14+EGFR -     Lipid panel  Screen  for colon cancer -     Fecal occult blood, imunochemical; Future  Coronary artery disease of native artery of native heart with stable angina pectoris ([place])  Educated about COVID-19 virus infection   I am having Joseph Shields. maintain his aspirin EC, rosuvastatin, folic acid, pyridOXINE, triamcinolone ointment, metoprolol succinate, and methotrexate.  Meds ordered this encounter  Medications  . methotrexate 2.5 MG tablet    Sig: TAKE 4 TABLETS ONE TIME WEEKLY (CAUTION: CHEMOTHERAPY. PROTECT FROM LIGHT)    Dispense:  48 tablet    Refill:  1     Follow-up: Return in about 6 months (around 08/31/2020).  [name], M.D.

## 2020-03-04 ENCOUNTER — Other Ambulatory Visit: Payer: Medicare HMO

## 2020-03-04 DIAGNOSIS — Z1211 Encounter for screening for malignant neoplasm of colon: Secondary | ICD-10-CM

## 2020-03-04 NOTE — Progress Notes (Signed)
Hello Joseph Shields,  Your lab result is normal and/or stable.Some minor variations that are not significant are commonly marked abnormal, but do not represent any medical problem for you.  Best regards, Kailyn Dubie, M.D.

## 2020-03-05 DIAGNOSIS — H25812 Combined forms of age-related cataract, left eye: Secondary | ICD-10-CM | POA: Diagnosis not present

## 2020-03-05 DIAGNOSIS — Z01818 Encounter for other preprocedural examination: Secondary | ICD-10-CM | POA: Diagnosis not present

## 2020-03-05 DIAGNOSIS — H25811 Combined forms of age-related cataract, right eye: Secondary | ICD-10-CM | POA: Diagnosis not present

## 2020-03-06 LAB — FECAL OCCULT BLOOD, IMMUNOCHEMICAL: Fecal Occult Bld: NEGATIVE

## 2020-03-10 DIAGNOSIS — H2511 Age-related nuclear cataract, right eye: Secondary | ICD-10-CM | POA: Diagnosis not present

## 2020-03-10 DIAGNOSIS — H25811 Combined forms of age-related cataract, right eye: Secondary | ICD-10-CM | POA: Diagnosis not present

## 2020-03-26 ENCOUNTER — Other Ambulatory Visit: Payer: Self-pay | Admitting: Family Medicine

## 2020-03-26 DIAGNOSIS — L409 Psoriasis, unspecified: Secondary | ICD-10-CM

## 2020-04-09 ENCOUNTER — Other Ambulatory Visit: Payer: Self-pay | Admitting: Cardiology

## 2020-04-09 DIAGNOSIS — E782 Mixed hyperlipidemia: Secondary | ICD-10-CM

## 2020-04-14 ENCOUNTER — Other Ambulatory Visit: Payer: Self-pay | Admitting: Cardiology

## 2020-04-14 DIAGNOSIS — I25118 Atherosclerotic heart disease of native coronary artery with other forms of angina pectoris: Secondary | ICD-10-CM

## 2020-04-14 DIAGNOSIS — I10 Essential (primary) hypertension: Secondary | ICD-10-CM

## 2020-04-23 ENCOUNTER — Ambulatory Visit: Payer: Medicare HMO | Admitting: Cardiology

## 2020-04-23 ENCOUNTER — Encounter: Payer: Self-pay | Admitting: Cardiology

## 2020-04-23 ENCOUNTER — Other Ambulatory Visit: Payer: Self-pay

## 2020-04-23 VITALS — BP 148/82 | HR 61 | Ht 67.0 in | Wt 213.2 lb

## 2020-04-23 DIAGNOSIS — E782 Mixed hyperlipidemia: Secondary | ICD-10-CM

## 2020-04-23 DIAGNOSIS — I10 Essential (primary) hypertension: Secondary | ICD-10-CM | POA: Diagnosis not present

## 2020-04-23 DIAGNOSIS — E669 Obesity, unspecified: Secondary | ICD-10-CM | POA: Diagnosis not present

## 2020-04-23 DIAGNOSIS — I25118 Atherosclerotic heart disease of native coronary artery with other forms of angina pectoris: Secondary | ICD-10-CM

## 2020-04-23 NOTE — Progress Notes (Addendum)
Cardiology Office Note:    Date:  04/23/2020   ID:  Joseph Norman., DOB [birth year], MRN [id number]  PCP:  [name], MD  Cardiologist:  Fransico Him, MD    Referring MD: [name], MD   Chief Complaint  Patient presents with  . Coronary Artery Disease  . Hypertension  . Hyperlipidemia    History of Present Illness:    Joseph Stoop. is a 74 y.o. 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 He also has a history of hyperlipidemia and COPD. He was started on lipitor and stopped it due to muscle aches and remains on Crestor. He also stopped coreg because he said it made his legs swell and he is now on metoprolol.   He is here today for followup and is doing well.  He has chronic DOE related to COPD that is stable.  He denies any chest pain or pressure, PND, orthopnea, LE edema, dizziness, palpitations or syncope. He is compliant with his meds and is tolerating meds with no SE.    Past Medical History:  Diagnosis Date  . CAD (coronary artery disease)    a. 03/2014 NSTEMI - initially refused cath;  b. 03/2014 Echo: EF 55-60%;  c. 04/2014 Cath: LM nl, LAD 20-30p, 73m, D1 30-40, D2 small, 60-70, LCX nl, RCA 100p (CTO), L->R collaterals, EF 55-60%-->Med Rx.  [name] COPD (chronic obstructive pulmonary disease) ([place])   . History of tobacco abuse    a. 40+ pack years, quit 03/2014.  [name] Hyperlipidemia   . Morbid obesity ([place])   . Psoriasis     Past Surgical History:  Procedure Laterality Date  . LEFT HEART CATHETERIZATION WITH CORONARY ANGIOGRAM N/A 05/06/2014   Procedure: LEFT HEART CATHETERIZATION WITH CORONARY ANGIOGRAM;  Surgeon: Leonie Man, MD;  Location: [facility] CATH LAB;  Service: Cardiovascular;  Laterality: N/A;  . SPINE SURGERY      Current Medications: Current Meds  Medication Sig  . aspirin EC 81 MG tablet Take 81 mg  by mouth daily.  . folic acid (FOLVITE) 1 MG tablet TAKE 1 TABLET EVERY DAY  . methotrexate 2.5 MG tablet TAKE 4 TABLETS ONE TIME WEEKLY (CAUTION: CHEMOTHERAPY. PROTECT FROM LIGHT)  . metoprolol succinate (TOPROL-XL) 25 MG 24 hr tablet TAKE 1 TABLET EVERY DAY (NEED MD APPOINTMENT)  . pyridOXINE (B-6) 50 MG tablet Take 1 tablet (50 mg total) by mouth daily.  . rosuvastatin (CRESTOR) 20 MG tablet Take 1 tablet (20 mg total) by mouth daily. Please make overdue appt with Dr. Radford Pax before anymore refills. Thank you 1st attempt  . triamcinolone ointment (KENALOG) 0.1 % APPLY ONE APPLICATION TOPICALLY TWO TIMES A DAY     Allergies:   Flonase [fluticasone propionate] and Penicillins   Social History   Socioeconomic History  . Marital status: Married    Spouse name: Joseph Shields  . Number of children: 2  . Years of education: associates degree  . Highest education level: Associate degree: occupational, Hotel manager, or vocational program  Occupational History  . Occupation: Retired    Comment: Hotel manager  Tobacco Use  . Smoking status: Former Smoker    Packs/day: 0.50    Years: 49.00    Pack years: 24.50    Types: Cigarettes    Quit date: 04/07/2014    Years since quitting: 6.0  . Smokeless tobacco: Never  Used  Vaping Use  . Vaping Use: Never used  Substance and Sexual Activity  . Alcohol use: No    Alcohol/week: 0.0 standard drinks  . Drug use: No  . Sexual activity: Yes  Other Topics Concern  . Not on file  Social History Narrative  . Not on file   Social Determinants of Health   Financial Resource Strain: Low Risk   . Difficulty of Paying Living Expenses: Not hard at all  Food Insecurity: No Food Insecurity  . Worried About Programme researcher, broadcasting/film/video in the Last Year: Never true  . Ran Out of Food in the Last Year: Never true  Transportation Needs: No Transportation Needs  . Lack of Transportation (Medical): No  . Lack of Transportation (Non-Medical): No  Physical  Activity: Inactive  . Days of Exercise per Week: 0 days  . Minutes of Exercise per Session: 0 min  Stress: No Stress Concern Present  . Feeling of Stress : Not at all  Social Connections: Moderately Integrated  . Frequency of Communication with Friends and Family: Once a week  . Frequency of Social Gatherings with Friends and Family: Twice a week  . Attends Religious Services: More than 4 times per year  . Active Member of Clubs or Organizations: No  . Attends Banker Meetings: Never  . Marital Status: Married     Family History: The patient's family history includes Allergies in his mother; Asthma in his mother; Cancer in his mother; Healthy in his daughter and daughter; Heart disease (age of onset: 49) in his father; Stroke (age of onset: 69) in his mother.  ROS:   Please see the history of present illness.    ROS  All other systems reviewed and negative.   EKGs/Labs/Other Studies Reviewed:    The following studies were reviewed today: none  EKG:  EKG is  ordered today.  The ekg ordered today demonstrates NSR at 61bpm with no ST changes  Recent Labs: 03/03/2020: ALT 17; BUN 18; Creatinine, Ser 1.03; Hemoglobin 14.7; Platelets 176; Potassium 5.0; Sodium 139   Recent Lipid Panel    Component Value Date/Time   CHOL 133 03/03/2020 1112   TRIG 75 03/03/2020 1112   HDL 49 03/03/2020 1112   CHOLHDL 2.7 03/03/2020 1112   CHOLHDL 2.6 09/29/2015 0914   VLDL 15 09/29/2015 0914   LDLCALC 69 03/03/2020 1112   LDLDIRECT 71 05/10/2016 1639    Physical Exam:    VS:  BP (!) 148/82   Pulse 61   Ht 5\' 7"  (1.702 m)   Wt 213 lb 3.2 oz (96.7 kg)   SpO2 92%   BMI 33.39 kg/m     Wt Readings from Last 3 Encounters:  04/23/20 213 lb 3.2 oz (96.7 kg)  03/03/20 209 lb 12.8 oz (95.2 kg)  08/30/19 209 lb (94.8 kg)     GEN: Well nourished, well developed in no acute distress HEENT: Normal NECK: No JVD; No carotid bruits LYMPHATICS: No lymphadenopathy CARDIAC:RRR, no  murmurs, rubs, gallops RESPIRATORY:  Clear to auscultation without rales, wheezing or rhonchi  ABDOMEN: Soft, non-tender, non-distended MUSCULOSKELETAL:  Trace edema; No deformity  SKIN: Warm and dry NEUROLOGIC:  Alert and oriented x 3 PSYCHIATRIC:  Normal affect    ASSESSMENT:    1. Coronary artery disease of native artery of native heart with stable angina pectoris (HCC)   2. Mixed hyperlipidemia   3. Obesity (BMI 30-39.9)   4. Essential hypertension    PLAN:  In order of problems listed above:  1.  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  -he has not had any anginal sx since I saw him last.  He has chronic DOE related to COPD that is stable -continue ASA, statin and BB  2.  HLD -LDL goal < 70 -LDL was 69 in Nov 2021 -continue Crestor 20mg  daily  3.  Obesity -I have encouraged him to get into a routine exercise program and cut back on carbs and portions.   4.  HTN -BP borderline controlled on exam today but says that his SBP at home runs in the 130s -continue Toprol XL 25mg  daily    Medication Adjustments/Labs and Tests Ordered: Current medicines are reviewed at length with the patient today.  Concerns regarding medicines are outlined above.  Orders Placed This Encounter  Procedures  . EKG 12-Lead   No orders of the defined types were placed in this encounter.   Signed, , MD  04/23/2020 10:32 AM    [facility]

## 2020-04-23 NOTE — Patient Instructions (Signed)

## 2020-05-01 DIAGNOSIS — C44319 Basal cell carcinoma of skin of other parts of face: Secondary | ICD-10-CM | POA: Diagnosis not present

## 2020-05-14 ENCOUNTER — Other Ambulatory Visit: Payer: Self-pay | Admitting: Cardiology

## 2020-05-14 DIAGNOSIS — E782 Mixed hyperlipidemia: Secondary | ICD-10-CM

## 2020-05-23 ENCOUNTER — Telehealth: Payer: Medicare HMO | Admitting: Nurse Practitioner

## 2020-05-23 ENCOUNTER — Encounter: Payer: Self-pay | Admitting: Nurse Practitioner

## 2020-05-23 DIAGNOSIS — J441 Chronic obstructive pulmonary disease with (acute) exacerbation: Secondary | ICD-10-CM

## 2020-05-23 MED ORDER — BENZONATATE 100 MG PO CAPS: 100.0000 mg | ORAL_CAPSULE | Freq: Two times a day (BID) | ORAL | 0 refills | Status: DC | PRN

## 2020-05-23 MED ORDER — DOXYCYCLINE HYCLATE 100 MG PO TABS: 100.0000 mg | ORAL_TABLET | Freq: Two times a day (BID) | ORAL | 0 refills | Status: DC

## 2020-05-23 MED ORDER — ALBUTEROL SULFATE HFA 108 (90 BASE) MCG/ACT IN AERS: 2.0000 | INHALATION_SPRAY | Freq: Four times a day (QID) | RESPIRATORY_TRACT | 0 refills | Status: DC | PRN

## 2020-05-23 NOTE — Assessment & Plan Note (Signed)
-  COPD exacerbation with bronchitis -Rx. Doxycycline -Rx. Tessalon perles -Rx. Albuterol inhaler -hasn't needed albuterol lately d/t improvement with COPD, but he states he would like to have inhaler in case he needs it

## 2020-05-23 NOTE — Progress Notes (Addendum)
Acute Office Visit  Subjective:    Patient ID: Joseph Shields., male    DOB: 1945/10/25, 75 y.o.   MRN: [id number]  Chief Complaint  Patient presents with  . Cough    X2 days    HPI Patient is in today for sick visit, He states he tested negative for COVID, but he is a COPD patient with a bad cough and congestion. He has cough, congestion, and drainage that started bad yesterday. OTC medication not helping.  Past Medical History:  Diagnosis Date  . CAD (coronary artery disease)    a. 03/2014 NSTEMI - initially refused cath;  b. 03/2014 Echo: EF 55-60%;  c. 04/2014 Cath: LM nl, LAD 20-30p, 5m, D1 30-40, D2 small, 60-70, LCX nl, RCA 100p (CTO), L->R collaterals, EF 55-60%-->Med Rx.  [name] COPD (chronic obstructive pulmonary disease) ([place])   . History of tobacco abuse    a. 40+ pack years, quit 03/2014.  [name] Hyperlipidemia   . Morbid obesity ([place])   . Psoriasis     Past Surgical History:  Procedure Laterality Date  . LEFT HEART CATHETERIZATION WITH CORONARY ANGIOGRAM N/A 05/06/2014   Procedure: LEFT HEART CATHETERIZATION WITH CORONARY ANGIOGRAM;  Surgeon: Leonie Man, MD;  [facility] CATH LAB;  Service: Cardiovascular;  Laterality: N/A;  . SPINE SURGERY      Family History  Problem Relation Age of Onset  . Stroke Mother 42  . Allergies Mother   . Asthma Mother   . Cancer Mother        colon  . Heart disease Father 71       quadruple bypass  . Healthy Daughter   . Healthy Daughter     Social History   Socioeconomic History  . Marital status: Married    Spouse name: Hassan Rowan  . Number of children: 2  . Years of education: associates degree  . Highest education level: Associate degree: occupational, Hotel manager, or vocational program  Occupational History  . Occupation: Retired    Comment: Hotel manager  Tobacco Use  . Smoking status: Former Smoker    Packs/day: 0.50    Years: 49.00    Pack years: 24.50    Types: Cigarettes    Quit date:  04/07/2014    Years since quitting: 6.1  . Smokeless tobacco: Never Used  Vaping Use  . Vaping Use: Never used  Substance and Sexual Activity  . Alcohol use: No    Alcohol/week: 0.0 standard drinks  . Drug use: No  . Sexual activity: Yes  Other Topics Concern  . Not on file  Social History Narrative  . Not on file   Social Determinants of Health   Financial Resource Strain: Low Risk   . Difficulty of Paying Living Expenses: Not hard at all  Food Insecurity: No Food Insecurity  . Worried About Charity fundraiser in the Last Year: Never true  . Ran Out of Food in the Last Year: Never true  Transportation Needs: No Transportation Needs  . Lack of Transportation (Medical): No  . Lack of Transportation (Non-Medical): No  Physical Activity: Inactive  . Days of Exercise per Week: 0 days  . Minutes of Exercise per Session: 0 min  Stress: No Stress Concern Present  . Feeling of Stress : Not at all  Social Connections: Moderately Integrated  . Frequency of Communication with Friends and Family: Once a week  . Frequency of Social Gatherings with Friends and Family: Twice a week  .  Attends Religious Services: More than 4 times per year  . Active Member of Clubs or Organizations: No  . Attends Archivist Meetings: Never  . Marital Status: Married  Human resources officer Violence: Not At Risk  . Fear of Current or Ex-Partner: No  . Emotionally Abused: No  . Physically Abused: No  . Sexually Abused: No    Outpatient Medications Prior to Visit  Medication Sig Dispense Refill  . aspirin EC 81 MG tablet Take 81 mg by mouth daily.    . folic acid (FOLVITE) 1 MG tablet TAKE 1 TABLET EVERY DAY 90 tablet 1  . methotrexate 2.5 MG tablet TAKE 4 TABLETS ONE TIME WEEKLY (CAUTION: CHEMOTHERAPY. PROTECT FROM LIGHT) 48 tablet 1  . metoprolol succinate (TOPROL-XL) 25 MG 24 hr tablet TAKE 1 TABLET EVERY DAY (NEED MD APPOINTMENT) 90 tablet 0  . pyridOXINE (B-6) 50 MG tablet Take 1 tablet (50  mg total) by mouth daily. 90 tablet 3  . rosuvastatin (CRESTOR) 20 MG tablet Take 1 tablet (20 mg total) by mouth daily. 90 tablet 3  . triamcinolone ointment (KENALOG) 0.1 % APPLY ONE APPLICATION TOPICALLY TWO TIMES A DAY 454 g 2   No facility-administered medications prior to visit.    Allergies  Allergen Reactions  . Flonase [Fluticasone Propionate] Other (See Comments)    Nose bleeds.  . Penicillins Other (See Comments)    REACTION: unknown    Review of Systems  Constitutional: Negative for chills, fatigue and fever.  HENT: Positive for congestion, postnasal drip, sinus pressure and sore throat. Negative for sinus pain.   Respiratory: Positive for cough, shortness of breath and wheezing. Negative for chest tightness.   Cardiovascular: Negative.        Objective:    Physical Exam  There were no vitals taken for this visit. Wt Readings from Last 3 Encounters:  04/23/20 213 lb 3.2 oz (96.7 kg)  03/03/20 209 lb 12.8 oz (95.2 kg)  08/30/19 209 lb (94.8 kg)    Health Maintenance Due  Topic Date Due  . COVID-19 Vaccine (3 - Booster for Moderna series) 12/11/2019    There are no preventive care reminders to display for this patient.   Lab Results  Component Value Date   TSH 1.380 10/17/2017   Lab Results  Component Value Date   WBC 7.2 03/03/2020   HGB 14.7 03/03/2020   HCT 45.0 03/03/2020   MCV 95 03/03/2020   PLT 176 03/03/2020   Lab Results  Component Value Date   NA 139 03/03/2020   K 5.0 03/03/2020   CO2 25 03/03/2020   GLUCOSE 98 03/03/2020   BUN 18 03/03/2020   CREATININE 1.03 03/03/2020   BILITOT 0.6 03/03/2020   ALKPHOS 58 03/03/2020   AST 24 03/03/2020   ALT 17 03/03/2020   PROT 6.9 03/03/2020   ALBUMIN 4.4 03/03/2020   CALCIUM 9.2 03/03/2020   ANIONGAP 9 11/05/2015   GFR 89.32 05/03/2014   Lab Results  Component Value Date   CHOL 133 03/03/2020   Lab Results  Component Value Date   HDL 49 03/03/2020   Lab Results  Component Value  Date   LDLCALC 69 03/03/2020   Lab Results  Component Value Date   TRIG 75 03/03/2020   Lab Results  Component Value Date   CHOLHDL 2.7 03/03/2020   Lab Results  Component Value Date   HGBA1C 5.7 04/21/2018       Assessment & Plan:   Problem List Items Addressed This  Visit      Respiratory   COPD with acute exacerbation ([place])    -COPD exacerbation with bronchitis -Rx. Doxycycline -Rx. Tessalon perles -Rx. Albuterol inhaler -hasn't needed albuterol lately d/t improvement with COPD, but he states he would like to have inhaler in case he needs it      Relevant Medications   albuterol (VENTOLIN HFA) 108 (90 Base) MCG/ACT inhaler   benzonatate (TESSALON) 100 MG capsule       Meds ordered this encounter  Medications  . DISCONTD: doxycycline (VIBRA-TABS) 100 MG tablet    Sig: Take 1 tablet (100 mg total) by mouth 2 (two) times daily.    Dispense:  20 tablet    Refill:  0  . albuterol (VENTOLIN HFA) 108 (90 Base) MCG/ACT inhaler    Sig: Inhale 2 puffs into the lungs every 6 (six) hours as needed for wheezing or shortness of breath.    Dispense:  8 g    Refill:  0  . benzonatate (TESSALON) 100 MG capsule    Sig: Take 1 capsule (100 mg total) by mouth 2 (two) times daily as needed for cough.    Dispense:  20 capsule    Refill:  0  . doxycycline (VIBRA-TABS) 100 MG tablet    Sig: Take 1 tablet (100 mg total) by mouth 2 (two) times daily.    Dispense:  14 tablet    Refill:  0   Date:  05/23/2020   Location of Patient: Home Location of Provider: Office Consent was obtain for visit to be over via telehealth. I verified that I am speaking with the correct person using two identifiers.  I connected with  Joseph Shields. on 05/23/20 via video chat and verified that I am speaking with the correct person using two identifiers.   I discussed the limitations of evaluation and management by telemedicine. The patient expressed understanding and agreed to proceed.  Time  spent: 15 min   Noreene Larsson, NP

## 2020-07-01 ENCOUNTER — Other Ambulatory Visit: Payer: Self-pay | Admitting: Cardiology

## 2020-07-01 DIAGNOSIS — I25118 Atherosclerotic heart disease of native coronary artery with other forms of angina pectoris: Secondary | ICD-10-CM

## 2020-07-01 DIAGNOSIS — I10 Essential (primary) hypertension: Secondary | ICD-10-CM

## 2020-08-08 ENCOUNTER — Other Ambulatory Visit: Payer: Self-pay | Admitting: Family Medicine

## 2020-08-08 DIAGNOSIS — L409 Psoriasis, unspecified: Secondary | ICD-10-CM

## 2020-08-11 NOTE — Telephone Encounter (Signed)
Last office visit 03/03/20 Last refill 03/03/20, #48, 1 refill

## 2020-08-13 ENCOUNTER — Other Ambulatory Visit: Payer: Self-pay | Admitting: Family Medicine

## 2020-08-13 DIAGNOSIS — L409 Psoriasis, unspecified: Secondary | ICD-10-CM

## 2020-08-15 DIAGNOSIS — H04123 Dry eye syndrome of bilateral lacrimal glands: Secondary | ICD-10-CM | POA: Diagnosis not present

## 2020-09-01 ENCOUNTER — Ambulatory Visit (INDEPENDENT_AMBULATORY_CARE_PROVIDER_SITE_OTHER): Payer: Medicare HMO | Admitting: Family Medicine

## 2020-09-01 ENCOUNTER — Other Ambulatory Visit: Payer: Self-pay

## 2020-09-01 ENCOUNTER — Encounter: Payer: Self-pay | Admitting: Family Medicine

## 2020-09-01 VITALS — BP 136/61 | HR 62 | Temp 98.1°F | Ht 67.0 in | Wt 213.2 lb

## 2020-09-01 DIAGNOSIS — E782 Mixed hyperlipidemia: Secondary | ICD-10-CM

## 2020-09-01 DIAGNOSIS — J449 Chronic obstructive pulmonary disease, unspecified: Secondary | ICD-10-CM | POA: Diagnosis not present

## 2020-09-01 DIAGNOSIS — L409 Psoriasis, unspecified: Secondary | ICD-10-CM | POA: Diagnosis not present

## 2020-09-01 DIAGNOSIS — I10 Essential (primary) hypertension: Secondary | ICD-10-CM

## 2020-09-01 DIAGNOSIS — Z125 Encounter for screening for malignant neoplasm of prostate: Secondary | ICD-10-CM | POA: Diagnosis not present

## 2020-09-01 MED ORDER — FOLIC ACID 1 MG PO TABS: 1.0000 mg | ORAL_TABLET | Freq: Every day | ORAL | 3 refills | Status: DC

## 2020-09-01 MED ORDER — ALBUTEROL SULFATE HFA 108 (90 BASE) MCG/ACT IN AERS: 2.0000 | INHALATION_SPRAY | Freq: Four times a day (QID) | RESPIRATORY_TRACT | 2 refills | Status: DC | PRN

## 2020-09-01 MED ORDER — METHOTREXATE SODIUM 2.5 MG PO TABS
ORAL_TABLET | ORAL | 1 refills | Status: DC
Start: 2020-09-01 — End: 2021-03-04

## 2020-09-01 NOTE — Progress Notes (Signed)
Subjective:  Patient ID: Joseph Norman., male    DOB: 26-Jun-1945  Age: 75 y.o. MRN: [id number]  CC: Medical Management of Chronic Issues   HPI Joseph Larin. presents for  follow-up of hypertension. Patient has no history of headache chest pain or shortness of breath or recent cough. Patient also denies symptoms of TIA such as focal numbness or weakness. Patient denies side effects from medication. States taking it regularly.   in for follow-up of elevated cholesterol. Doing well without complaints on current medication. Denies side effects of statin including myalgia and arthralgia and nausea. Currently no chest pain, shortness of breath or other cardiovascular related symptoms noted.    History Joseph Shields has a past medical history of CAD (coronary artery disease), COPD (chronic obstructive pulmonary disease) ([place]), History of tobacco abuse, Hyperlipidemia, Morbid obesity ([place]), and Psoriasis.   He has a past surgical history that includes Spine surgery and left heart catheterization with coronary angiogram (N/A, 05/06/2014).   His family history includes Allergies in his mother; Asthma in his mother; Cancer in his mother; Healthy in his daughter and daughter; Heart disease (age of onset: 51) in his father; Stroke (age of onset: 93) in his mother.He reports that he quit smoking about 6 years ago. His smoking use included cigarettes. He has a 24.50 pack-year smoking history. He has never used smokeless tobacco. He reports that he does not drink alcohol and does not use drugs.  Current Outpatient Medications on File Prior to Visit  Medication Sig Dispense Refill  . aspirin EC 81 MG tablet Take 81 mg by mouth daily.    . metoprolol succinate (TOPROL-XL) 25 MG 24 hr tablet Take 1 tablet (25 mg total) by mouth daily. 90 tablet 2  . pyridOXINE (B-6) 50 MG tablet Take 1 tablet (50 mg total) by mouth daily. 90 tablet 3  . rosuvastatin (CRESTOR) 20 MG tablet Take 1 tablet (20 mg total) by  mouth daily. 90 tablet 3  . triamcinolone ointment (KENALOG) 0.1 % APPLY ONE APPLICATION TOPICALLY TWO TIMES A DAY 454 g 2   No current facility-administered medications on file prior to visit.    ROS Review of Systems  Constitutional: Negative for fever.  Respiratory: Negative for shortness of breath.   Cardiovascular: Negative for chest pain.  Musculoskeletal: Negative for arthralgias.  Skin: Negative for rash.    Objective:  BP 136/61   Pulse 62   Temp 98.1 F (36.7 C)   Ht 5' 7"  (1.702 m)   Wt 213 lb 3.2 oz (96.7 kg)   SpO2 95%   BMI 33.39 kg/m   BP Readings from Last 3 Encounters:  09/01/20 136/61  04/23/20 (!) 148/82  03/03/20 130/63    Wt Readings from Last 3 Encounters:  09/01/20 213 lb 3.2 oz (96.7 kg)  04/23/20 213 lb 3.2 oz (96.7 kg)  03/03/20 209 lb 12.8 oz (95.2 kg)     Physical Exam Vitals reviewed.  Constitutional:      Appearance: He is well-developed.  HENT:     Head: Normocephalic and atraumatic.     Right Ear: External ear normal.     Left Ear: External ear normal.     Mouth/Throat:     Pharynx: No oropharyngeal exudate or posterior oropharyngeal erythema.  Eyes:     Pupils: Pupils are equal, round, and reactive to light.  Cardiovascular:     Rate and Rhythm: Normal rate and regular rhythm.     Heart sounds: No murmur  heard.   Pulmonary:     Effort: No respiratory distress.     Breath sounds: Normal breath sounds.  Musculoskeletal:     Cervical back: Normal range of motion and neck supple.  Neurological:     Mental Status: He is alert and oriented to person, place, and time.       Assessment & Plan:   Joseph Shields was seen today for medical management of chronic issues.  Diagnoses and all orders for this visit:  Mixed hyperlipidemia -     CBC with Differential/Platelet -     CMP14+EGFR -     Lipid panel  Psoriasis -     methotrexate 2.5 MG tablet; TAKE 4 TABLETS ONE TIME WEEKLY (CAUTION: CHEMOTHERAPY. PROTECT FROM LIGHT) -      folic acid (FOLVITE) 1 MG tablet; Take 1 tablet (1 mg total) by mouth daily. -     CMP14+EGFR  Essential hypertension -     CBC with Differential/Platelet -     CMP14+EGFR  Screening for prostate cancer -     PSA, total and free  Other orders -     albuterol (VENTOLIN HFA) 108 (90 Base) MCG/ACT inhaler; Inhale 2 puffs into the lungs every 6 (six) hours as needed for wheezing or shortness of breath.   Allergies as of 09/01/2020      Reactions   Flonase [fluticasone Propionate] Other (See Comments)   Nose bleeds.   Penicillins Other (See Comments)   REACTION: unknown      Medication List       Accurate as of Sep 01, 2020 10:46 AM. If you have any questions, ask your nurse or doctor.        STOP taking these medications   benzonatate 100 MG capsule Commonly known as: TESSALON Stopped by: [name], MD   doxycycline 100 MG tablet Commonly known as: VIBRA-TABS Stopped by: [name], MD     TAKE these medications   albuterol 108 (90 Base) MCG/ACT inhaler Commonly known as: VENTOLIN HFA Inhale 2 puffs into the lungs every 6 (six) hours as needed for wheezing or shortness of breath.   aspirin EC 81 MG tablet Take 81 mg by mouth daily.   folic acid 1 MG tablet Commonly known as: FOLVITE Take 1 tablet (1 mg total) by mouth daily.   methotrexate 2.5 MG tablet TAKE 4 TABLETS ONE TIME WEEKLY (CAUTION: CHEMOTHERAPY. PROTECT FROM LIGHT)   metoprolol succinate 25 MG 24 hr tablet Commonly known as: TOPROL-XL Take 1 tablet (25 mg total) by mouth daily.   pyridOXINE 50 MG tablet Commonly known as: B-6 Take 1 tablet (50 mg total) by mouth daily.   rosuvastatin 20 MG tablet Commonly known as: CRESTOR Take 1 tablet (20 mg total) by mouth daily.   triamcinolone ointment 0.1 % Commonly known as: KENALOG APPLY ONE APPLICATION TOPICALLY TWO TIMES A DAY       Meds ordered this encounter  Medications  . albuterol (VENTOLIN HFA) 108 (90 Base) MCG/ACT inhaler     Sig: Inhale 2 puffs into the lungs every 6 (six) hours as needed for wheezing or shortness of breath.    Dispense:  8 g    Refill:  2  . methotrexate 2.5 MG tablet    Sig: TAKE 4 TABLETS ONE TIME WEEKLY (CAUTION: CHEMOTHERAPY. PROTECT FROM LIGHT)    Dispense:  48 tablet    Refill:  1  . folic acid (FOLVITE) 1 MG tablet    Sig: Take 1 tablet (  1 mg total) by mouth daily.    Dispense:  90 tablet    Refill:  3      Follow-up: Return in about 6 months (around 03/04/2021).  [name], M.D.

## 2020-09-02 LAB — CBC WITH DIFFERENTIAL/PLATELET
Basophils Absolute: 0 10*3/uL (ref 0.0–0.2)
Basos: 1 %
EOS (ABSOLUTE): 0.4 10*3/uL (ref 0.0–0.4)
Eos: 6 %
Hematocrit: 45.2 % (ref 37.5–51.0)
Hemoglobin: 15 g/dL (ref 13.0–17.7)
Immature Grans (Abs): 0 10*3/uL (ref 0.0–0.1)
Immature Granulocytes: 0 %
Lymphocytes Absolute: 1.6 10*3/uL (ref 0.7–3.1)
Lymphs: 23 %
MCH: 31.6 pg (ref 26.6–33.0)
MCHC: 33.2 g/dL (ref 31.5–35.7)
MCV: 95 fL (ref 79–97)
Monocytes Absolute: 0.9 10*3/uL (ref 0.1–0.9)
Monocytes: 13 %
Neutrophils Absolute: 4.1 10*3/uL (ref 1.4–7.0)
Neutrophils: 57 %
Platelets: 204 10*3/uL (ref 150–450)
RBC: 4.75 x10E6/uL (ref 4.14–5.80)
RDW: 13.7 % (ref 11.6–15.4)
WBC: 7.1 10*3/uL (ref 3.4–10.8)

## 2020-09-02 LAB — CMP14+EGFR
ALT: 24 IU/L (ref 0–44)
AST: 26 IU/L (ref 0–40)
Albumin/Globulin Ratio: 1.6 (ref 1.2–2.2)
Albumin: 4.4 g/dL (ref 3.7–4.7)
Alkaline Phosphatase: 60 IU/L (ref 44–121)
BUN/Creatinine Ratio: 16 (ref 10–24)
BUN: 16 mg/dL (ref 8–27)
Bilirubin Total: 0.5 mg/dL (ref 0.0–1.2)
CO2: 23 mmol/L (ref 20–29)
Calcium: 9.1 mg/dL (ref 8.6–10.2)
Chloride: 103 mmol/L (ref 96–106)
Creatinine, Ser: 0.99 mg/dL (ref 0.76–1.27)
Globulin, Total: 2.7 g/dL (ref 1.5–4.5)
Glucose: 111 mg/dL — ABNORMAL HIGH (ref 65–99)
Potassium: 5.4 mmol/L — ABNORMAL HIGH (ref 3.5–5.2)
Sodium: 140 mmol/L (ref 134–144)
Total Protein: 7.1 g/dL (ref 6.0–8.5)
eGFR: 80 mL/min/{1.73_m2} (ref >59)

## 2020-09-02 LAB — LIPID PANEL
Chol/HDL Ratio: 2.9 ratio (ref 0.0–5.0)
Cholesterol, Total: 130 mg/dL (ref 100–199)
HDL: 45 mg/dL (ref >39)
LDL Chol Calc (NIH): 69 mg/dL (ref 0–99)
Triglycerides: 80 mg/dL (ref 0–149)
VLDL Cholesterol Cal: 16 mg/dL (ref 5–40)

## 2020-09-02 LAB — PSA, TOTAL AND FREE
PSA, Free Pct: 46 %
PSA, Free: 0.23 ng/mL
Prostate Specific Ag, Serum: 0.5 ng/mL (ref 0.0–4.0)

## 2020-09-02 NOTE — Progress Notes (Signed)
Hello Asad,  Your lab result is normal and/or stable.Some minor variations that are not significant are commonly marked abnormal, but do not represent any medical problem for you.  Best regards, Jehan Ranganathan, M.D.

## 2020-09-18 ENCOUNTER — Ambulatory Visit (INDEPENDENT_AMBULATORY_CARE_PROVIDER_SITE_OTHER): Payer: Medicare HMO

## 2020-09-18 VITALS — Ht 67.0 in | Wt 212.0 lb

## 2020-09-18 DIAGNOSIS — Z0000 Encounter for general adult medical examination without abnormal findings: Secondary | ICD-10-CM | POA: Diagnosis not present

## 2020-09-18 NOTE — Progress Notes (Signed)
Subjective:   Joseph Shields. is a 75 y.o. male who presents for Medicare Annual/Subsequent preventive examination.  Virtual Visit via Telephone Note  I connected with  Joseph Shields. on 09/18/20 at  3:30 PM EDT by telephone and verified that I am speaking with the correct person using two identifiers.  Location: Patient: Home Provider: [facility] Persons participating in the virtual visit: patient/Nurse Health Advisor   I discussed the limitations, risks, security and privacy concerns of performing an evaluation and management service by telephone and the availability of in person appointments. The patient expressed understanding and agreed to proceed.  Interactive audio and video telecommunications were attempted between this nurse and patient, however failed, due to patient having technical difficulties OR patient did not have access to video capability.  We continued and completed visit with audio only.  Some vital signs may be absent or patient reported.   Garlene Apperson E Wofford Stratton, LPN   Review of Systems     Cardiac Risk Factors include: advanced age (>61men, >57 women);family history of premature cardiovascular disease;hypertension;male gender;obesity (BMI >30kg/m2);sedentary lifestyle;smoking/ tobacco exposure     Objective:    Today's Vitals   09/18/20 1534  Weight: 212 lb (96.2 kg)  Height: 5\' 7"  (1.702 m)   Body mass index is 33.2 kg/m.  Advanced Directives 09/18/2020 08/24/2019 07/25/2017 05/06/2014 04/14/2014 04/13/2014  Does Patient Have a Medical Advance Directive? No No No No No No  Would patient like information on creating a medical advance directive? Yes (MAU/Ambulatory/Procedural Areas - Information given) Yes (MAU/Ambulatory/Procedural Areas - Information given) Yes (MAU/Ambulatory/Procedural Areas - Information given) No - patient declined information No - patient declined information -    Current Medications (verified) Outpatient Encounter Medications as of  09/18/2020  Medication Sig  . albuterol (VENTOLIN HFA) 108 (90 Base) MCG/ACT inhaler Inhale 2 puffs into the lungs every 6 (six) hours as needed for wheezing or shortness of breath.  [name] aspirin EC 81 MG tablet Take 81 mg by mouth daily.  . folic acid (FOLVITE) 1 MG tablet Take 1 tablet (1 mg total) by mouth daily.  . methotrexate 2.5 MG tablet TAKE 4 TABLETS ONE TIME WEEKLY (CAUTION: CHEMOTHERAPY. PROTECT FROM LIGHT)  . metoprolol succinate (TOPROL-XL) 25 MG 24 hr tablet Take 1 tablet (25 mg total) by mouth daily.  [name] pyridOXINE (B-6) 50 MG tablet Take 1 tablet (50 mg total) by mouth daily.  . rosuvastatin (CRESTOR) 20 MG tablet Take 1 tablet (20 mg total) by mouth daily.  [name] triamcinolone ointment (KENALOG) 0.1 % APPLY ONE APPLICATION TOPICALLY TWO TIMES A DAY   No facility-administered encounter medications on file as of 09/18/2020.    Allergies (verified) Flonase [fluticasone propionate] and Penicillins   History: Past Medical History:  Diagnosis Date  . CAD (coronary artery disease)    a. 03/2014 NSTEMI - initially refused cath;  b. 03/2014 Echo: EF 55-60%;  c. 04/2014 Cath: LM nl, LAD 20-30p, 94m, D1 30-40, D2 small, 60-70, LCX nl, RCA 100p (CTO), L->R collaterals, EF 55-60%-->Med Rx.  [name] COPD (chronic obstructive pulmonary disease) ([place])   . History of tobacco abuse    a. 40+ pack years, quit 03/2014.  [name] Hyperlipidemia   . Morbid obesity ([place])   . Psoriasis    Past Surgical History:  Procedure Laterality Date  . LEFT HEART CATHETERIZATION WITH CORONARY ANGIOGRAM N/A 05/06/2014   Procedure: LEFT HEART CATHETERIZATION WITH CORONARY ANGIOGRAM;  Surgeon: Leonie Man, MD;  [facility] C[facility] LAB;  Service:  Cardiovascular;  Laterality: N/A;  . SPINE SURGERY     Family History  Problem Relation Age of Onset  . Stroke Mother 77  . Allergies Mother   . Asthma Mother   . Cancer Mother        colon  . Heart disease Father 34       quadruple bypass  . Healthy Daughter   . Healthy  Daughter    Social History   Socioeconomic History  . Marital status: Married    Spouse name: Hassan Rowan  . Number of children: 2  . Years of education: associates degree  . Highest education level: Associate degree: occupational, Hotel manager, or vocational program  Occupational History  . Occupation: Retired    Comment: Hotel manager  Tobacco Use  . Smoking status: Former Smoker    Packs/day: 0.50    Years: 49.00    Pack years: 24.50    Types: Cigarettes    Quit date: 04/07/2014    Years since quitting: 6.4  . Smokeless tobacco: Never Used  Vaping Use  . Vaping Use: Never used  Substance and Sexual Activity  . Alcohol use: No    Alcohol/week: 0.0 standard drinks  . Drug use: No  . Sexual activity: Yes  Other Topics Concern  . Not on file  Social History Narrative   Lives home with wife. Children live nearby   Social Determinants of Health   Financial Resource Strain: Low Risk   . Difficulty of Paying Living Expenses: Not hard at all  Food Insecurity: No Food Insecurity  . Worried About Charity fundraiser in the Last Year: Never true  . Ran Out of Food in the Last Year: Never true  Transportation Needs: No Transportation Needs  . Lack of Transportation (Medical): No  . Lack of Transportation (Non-Medical): No  Physical Activity: Insufficiently Active  . Days of Exercise per Week: 7 days  . Minutes of Exercise per Session: 10 min  Stress: No Stress Concern Present  . Feeling of Stress : Not at all  Social Connections: Socially Integrated  . Frequency of Communication with Friends and Family: More than three times a week  . Frequency of Social Gatherings with Friends and Family: More than three times a week  . Attends Religious Services: More than 4 times per year  . Active Member of Clubs or Organizations: Yes  . Attends Archivist Meetings: More than 4 times per year  . Marital Status: Married    Tobacco Counseling Counseling given: Not  Answered   Clinical Intake:  Pre-visit preparation completed: Yes  Pain : No/denies pain     BMI - recorded: 33.2 Nutritional Status: BMI > 30  Obese Nutritional Risks: None Diabetes: No  How often do you need to have someone help you when you read instructions, pamphlets, or other written materials from your doctor or pharmacy?: 1 - Never  Diabetic? No  Interpreter Needed?: No  Information entered by :: Amer Alcindor, LPN   Activities of Daily Living In your present state of health, do you have any difficulty performing the following activities: 09/18/2020  Hearing? Y  Vision? N  Difficulty concentrating or making decisions? N  Walking or climbing stairs? N  Dressing or bathing? N  Doing errands, shopping? N  Preparing Food and eating ? N  Using the Toilet? N  In the past six months, have you accidently leaked urine? N  Do you have problems with loss of bowel control? N  Managing your Medications? N  Managing your Finances? N  Housekeeping or managing your Housekeeping? N  Some recent data might be hidden    Patient Care Team: [name], MD as PCP - General (Family Medicine) [name], MD as PCP - Cardiology (Cardiology) Harlen Labs, MD as Referring Physician (Optometry)  Indicate any recent Medical Services you may have received from other than [facility] providers in the past year (date may be approximate).     Assessment:   This is a routine wellness examination for Joseph Shields.  Hearing/Vision screen  Hearing Screening   125Hz  250Hz  500Hz  1000Hz  2000Hz  3000Hz  4000Hz  6000Hz  8000Hz   Right ear:           Left ear:           Comments: C/o mild hearing loss - declines hearing aids  Vision Screening Comments: Up to date with annual eye exam with Dr Marin Comment in [place]  Dietary issues and exercise activities discussed: Current Exercise Habits: Home exercise routine, Type of exercise: walking, Time (Minutes): 15, Frequency (Times/Week): 7, Weekly Exercise  (Minutes/Week): 105, Intensity: Mild, Exercise limited by: orthopedic condition(s)  Goals Addressed            This Visit's Progress   . [place]   Not on track    08/24/2019 AWV Goal: Exercise for General Health   Patient will verbalize understanding of the benefits of increased physical activity:  Exercising regularly is important. It will improve your overall fitness, flexibility, and endurance.  Regular exercise also will improve your overall health. It can help you control your weight, reduce stress, and improve your bone density.  Over the next year, patient will increase physical activity as tolerated with a goal of at least 150 minutes of moderate physical activity per week.   You can tell that you are exercising at a moderate intensity if your heart starts beating faster and you start breathing faster but can still hold a conversation.  Moderate-intensity exercise ideas include:  Walking 1 mile (1.6 km) in about 15 minutes  Biking  Hiking  Golfing  Dancing  Water aerobics  Patient will verbalize understanding of everyday activities that increase physical activity by providing examples like the following: ? Yard work, such as: ? Pushing a Conservation officer, nature ? Raking and bagging leaves ? Washing your car ? Pushing a stroller ? Shoveling snow ? Gardening ? Washing windows or floors  Patient will be able to explain general safety guidelines for exercising:   Before you start a new exercise program, talk with your health care provider.  Do not exercise so much that you hurt yourself, feel dizzy, or get very short of breath.  Wear comfortable clothes and wear shoes with good support.  Drink plenty of water while you exercise to prevent dehydration or heat stroke.  Work out until your breathing and your heartbeat get faster.       Depression Screen PHQ 2/9 Scores 09/18/2020 09/01/2020 03/03/2020 08/30/2019 08/24/2019 08/23/2018 08/23/2018  PHQ - 2  Score 0 0 0 0 0 0 0    Fall Risk Fall Risk  09/18/2020 09/01/2020 03/03/2020 08/30/2019 08/24/2019  Falls in the past year? 0 0 0 0 0  Number falls in past yr: 0 - 0 0 0  Injury with Fall? 0 - 0 0 0  Risk for fall due to : No Fall Risks - No Fall Risks No Fall Risks No Fall Risks  Follow up Falls prevention discussed -  Falls evaluation completed Falls evaluation completed Falls evaluation completed    FALL RISK PREVENTION PERTAINING TO THE HOME:  Any stairs in or around the home? No  If so, are there any without handrails? No  Home free of loose throw rugs in walkways, pet beds, electrical cords, etc? Yes  Adequate lighting in your home to reduce risk of falls? Yes   ASSISTIVE DEVICES UTILIZED TO PREVENT FALLS:  Life alert? No  Use of a cane, walker or w/c? No  Grab bars in the bathroom? Yes  Shower chair or bench in shower? Yes  Elevated toilet seat or a handicapped toilet? Yes   TIMED UP AND GO:  Was the test performed? No . Telephonic visit.  Cognitive Function: Normal cognitive status assessed by direct observation by this Nurse Health Advisor. No abnormalities found.   MMSE - Mini Mental State Exam 07/25/2017  Orientation to time 5  Orientation to Place 5  Registration 3  Attention/ Calculation 5  Recall 2  Language- name 2 objects 2  Language- repeat 1  Language- follow 3 step command 3  Language- read & follow direction 1  Write a sentence 1  Copy design 1  Total score 29     6CIT Screen 08/24/2019 08/23/2018  What Year? 0 points 0 points  What month? 0 points 0 points  What time? 0 points 0 points  Count back from 20 0 points 0 points  Months in reverse 0 points 0 points  Repeat phrase 0 points 0 points  Total Score 0 0    Immunizations Immunization History  Administered Date(s) Administered  . Influenza, High Dose Seasonal PF 04/04/2015  . Moderna Sars-Covid-2 Vaccination 05/16/2019, 06/13/2019, 04/23/2020  . Pneumococcal Conjugate-13 04/04/2015  .  Pneumococcal Polysaccharide-23 10/17/2017    TDAP status: Due, Education has been provided regarding the importance of this vaccine. Advised may receive this vaccine at local pharmacy or Health Dept. Aware to provide a copy of the vaccination record if obtained from local pharmacy or Health Dept. Verbalized acceptance and understanding.  Flu Vaccine status: Declined, Education has been provided regarding the importance of this vaccine but patient still declined. Advised may receive this vaccine at local pharmacy or Health Dept. Aware to provide a copy of the vaccination record if obtained from local pharmacy or Health Dept. Verbalized acceptance and understanding.  Pneumococcal vaccine status: Up to date  Covid-19 vaccine status: Completed vaccines  Qualifies for Shingles Vaccine? Yes   Zostavax completed No   Shingrix Completed?: No.    Education has been provided regarding the importance of this vaccine. Patient has been advised to call insurance company to determine out of pocket expense if they have not yet received this vaccine. Advised may also receive vaccine at local pharmacy or Health Dept. Verbalized acceptance and understanding.  Screening Tests Health Maintenance  Topic Date Due  . Zoster Vaccines- Shingrix (1 of 2) Never done  . INFLUENZA VACCINE  11/24/2020  . COLON CANCER SCREENING ANNUAL FOBT  03/04/2021  . COVID-19 Vaccine  Completed  . Hepatitis C Screening  Completed  . PNA vac Low Risk Adult  Completed  . HPV VACCINES  Aged Out  . COLONOSCOPY (Pts 45-38yrs Insurance coverage will need to be confirmed)  Discontinued  . TETANUS/TDAP  Discontinued    Health Maintenance  Health Maintenance Due  Topic Date Due  . Zoster Vaccines- Shingrix (1 of 2) Never done    Colorectal cancer screening: Type of screening: FOBT/FIT. Completed 03/04/2020. Repeat every  1 years  Lung Cancer Screening: (Low Dose CT Chest recommended if Age 34-80 years, 30 pack-year currently smoking  OR have quit w/in 15years.) does not qualify.   Additional Screening:  Hepatitis C Screening: does qualify; Completed 10/15/2015  Vision Screening: Recommended annual ophthalmology exams for early detection of glaucoma and other disorders of the eye. Is the patient up to date with their annual eye exam?  Yes  Who is the provider or what is the name of the office in which the patient attends annual eye exams? Dr Marin Comment If pt is not established with a provider, would they like to be referred to a provider to establish care? No .   Dental Screening: Recommended annual dental exams for proper oral hygiene  Community Resource Referral / Chronic Care Management: CRR required this visit?  No   CCM required this visit?  No      Plan:     I have personally reviewed and noted the following in the patient's chart:   . Medical and social history . Use of alcohol, tobacco or illicit drugs  . Current medications and supplements including opioid prescriptions. Patient is not currently taking opioid prescriptions. . Functional ability and status . Nutritional status . Physical activity . Advanced directives . List of other physicians . Hospitalizations, surgeries, and ER visits in previous 12 months . Vitals . Screenings to include cognitive, depression, and falls . Referrals and appointments  In addition, I have reviewed and discussed with patient certain preventive protocols, quality metrics, and best practice recommendations. A written personalized care plan for preventive services as well as general preventive health recommendations were provided to patient.     Sandrea Hammond, LPN   [id number]   Nurse Notes: None

## 2020-09-18 NOTE — Patient Instructions (Signed)
Joseph Shields , Thank you for taking time to come for your Medicare Wellness Visit. I appreciate your ongoing commitment to your health goals. Please review the following plan we discussed and let me know if I can assist you in the future.   Screening recommendations/referrals: Colonoscopy: FOBT done 03/04/2020 - repeat annually Recommended yearly ophthalmology/optometry visit for glaucoma screening and checkup Recommended yearly dental visit for hygiene and checkup  Vaccinations: Influenza vaccine: Declined Pneumococcal vaccine: Done 04/04/2015 & 10/17/2017 Tdap vaccine: Due *every 10 years Shingles vaccine: Due *ask provider since you take Methotrexate   Covid-19: Done 05/16/2019, 06/13/2019, & 04/03/2020  Advanced directives: Advance directive discussed with you today. I have provided a copy for you to complete at home and have notarized. Once this is complete please bring a copy in to our office so we can scan it into your chart.  Conditions/risks identified: Aim for 30 minutes of exercise or brisk walking each day, drink 6-8 glasses of water and eat lots of fruits and vegetables.  Next appointment: Follow up in one year for your annual wellness visit.   Preventive Care 38 Years and Older, Male  Preventive care refers to lifestyle choices and visits with your health care provider that can promote health and wellness. What does preventive care include?  A yearly physical exam. This is also called an annual well check.  Dental exams once or twice a year.  Routine eye exams. Ask your health care provider how often you should have your eyes checked.  Personal lifestyle choices, including:  Daily care of your teeth and gums.  Regular physical activity.  Eating a healthy diet.  Avoiding tobacco and drug use.  Limiting alcohol use.  Practicing safe sex.  Taking low doses of aspirin every day.  Taking vitamin and mineral supplements as recommended by your health care provider. What  happens during an annual well check? The services and screenings done by your health care provider during your annual well check will depend on your age, overall health, lifestyle risk factors, and family history of disease. Counseling  Your health care provider may ask you questions about your:  Alcohol use.  Tobacco use.  Drug use.  Emotional well-being.  Home and relationship well-being.  Sexual activity.  Eating habits.  History of falls.  Memory and ability to understand (cognition).  Work and work Statistician. Screening  You may have the following tests or measurements:  Height, weight, and BMI.  Blood pressure.  Lipid and cholesterol levels. These may be checked every 5 years, or more frequently if you are over 76 years old.  Skin check.  Lung cancer screening. You may have this screening every year starting at age 66 if you have a 30-pack-year history of smoking and currently smoke or have quit within the past 15 years.  Fecal occult blood test (FOBT) of the stool. You may have this test every year starting at age 33.  Flexible sigmoidoscopy or colonoscopy. You may have a sigmoidoscopy every 5 years or a colonoscopy every 10 years starting at age 33.  Prostate cancer screening. Recommendations will vary depending on your family history and other risks.  Hepatitis C blood test.  Hepatitis B blood test.  Sexually transmitted disease (STD) testing.  Diabetes screening. This is done by checking your blood sugar (glucose) after you have not eaten for a while (fasting). You may have this done every 1-3 years.  Abdominal aortic aneurysm (AAA) screening. You may need this if you are a current  or former smoker.  Osteoporosis. You may be screened starting at age 28 if you are at high risk. Talk with your health care provider about your test results, treatment options, and if necessary, the need for more tests. Vaccines  Your health care provider may recommend  certain vaccines, such as:  Influenza vaccine. This is recommended every year.  Tetanus, diphtheria, and acellular pertussis (Tdap, Td) vaccine. You may need a Td booster every 10 years.  Zoster vaccine. You may need this after age 58.  Pneumococcal 13-valent conjugate (PCV13) vaccine. One dose is recommended after age 33.  Pneumococcal polysaccharide (PPSV23) vaccine. One dose is recommended after age 85. Talk to your health care provider about which screenings and vaccines you need and how often you need them. This information is not intended to replace advice given to you by your health care provider. Make sure you discuss any questions you have with your health care provider. Document Released: 05/09/2015 Document Revised: 12/31/2015 Document Reviewed: 02/11/2015 Elsevier Interactive Patient Education  2017 [place] Prevention in the Home Falls can cause injuries. They can happen to people of all ages. There are many things you can do to make your home safe and to help prevent falls. What can I do on the outside of my home?  Regularly fix the edges of walkways and driveways and fix any cracks.  Remove anything that might make you trip as you walk through a door, such as a raised step or threshold.  Trim any bushes or trees on the path to your home.  Use bright outdoor lighting.  Clear any walking paths of anything that might make someone trip, such as rocks or tools.  Regularly check to see if handrails are loose or broken. Make sure that both sides of any steps have handrails.  Any raised decks and porches should have guardrails on the edges.  Have any leaves, snow, or ice cleared regularly.  Use sand or salt on walking paths during winter.  Clean up any spills in your garage right away. This includes oil or grease spills. What can I do in the bathroom?  Use night lights.  Install grab bars by the toilet and in the tub and shower. Do not use towel bars as  grab bars.  Use non-skid mats or decals in the tub or shower.  If you need to sit down in the shower, use a plastic, non-slip stool.  Keep the floor dry. Clean up any water that spills on the floor as soon as it happens.  Remove soap buildup in the tub or shower regularly.  Attach bath mats securely with double-sided non-slip rug tape.  Do not have throw rugs and other things on the floor that can make you trip. What can I do in the bedroom?  Use night lights.  Make sure that you have a light by your bed that is easy to reach.  Do not use any sheets or blankets that are too big for your bed. They should not hang down onto the floor.  Have a firm chair that has side arms. You can use this for support while you get dressed.  Do not have throw rugs and other things on the floor that can make you trip. What can I do in the kitchen?  Clean up any spills right away.  Avoid walking on wet floors.  Keep items that you use a lot in easy-to-reach places.  If you need to reach something above you,  use a strong step stool that has a grab bar.  Keep electrical cords out of the way.  Do not use floor polish or wax that makes floors slippery. If you must use wax, use non-skid floor wax.  Do not have throw rugs and other things on the floor that can make you trip. What can I do with my stairs?  Do not leave any items on the stairs.  Make sure that there are handrails on both sides of the stairs and use them. Fix handrails that are broken or loose. Make sure that handrails are as long as the stairways.  Check any carpeting to make sure that it is firmly attached to the stairs. Fix any carpet that is loose or worn.  Avoid having throw rugs at the top or bottom of the stairs. If you do have throw rugs, attach them to the floor with carpet tape.  Make sure that you have a light switch at the top of the stairs and the bottom of the stairs. If you do not have them, ask someone to add them  for you. What else can I do to help prevent falls?  Wear shoes that:  Do not have high heels.  Have rubber bottoms.  Are comfortable and fit you well.  Are closed at the toe. Do not wear sandals.  If you use a stepladder:  Make sure that it is fully opened. Do not climb a closed stepladder.  Make sure that both sides of the stepladder are locked into place.  Ask someone to hold it for you, if possible.  Clearly mark and make sure that you can see:  Any grab bars or handrails.  First and last steps.  Where the edge of each step is.  Use tools that help you move around (mobility aids) if they are needed. These include:  Canes.  Walkers.  Scooters.  Crutches.  Turn on the lights when you go into a dark area. Replace any light bulbs as soon as they burn out.  Set up your furniture so you have a clear path. Avoid moving your furniture around.  If any of your floors are uneven, fix them.  If there are any pets around you, be aware of where they are.  Review your medicines with your doctor. Some medicines can make you feel dizzy. This can increase your chance of falling. Ask your doctor what other things that you can do to help prevent falls. This information is not intended to replace advice given to you by your health care provider. Make sure you discuss any questions you have with your health care provider. Document Released: 02/06/2009 Document Revised: 09/18/2015 Document Reviewed: 05/17/2014 Elsevier Interactive Patient Education  2017 [employer].

## 2020-10-16 ENCOUNTER — Other Ambulatory Visit: Payer: Self-pay | Admitting: Family Medicine

## 2020-10-16 DIAGNOSIS — L409 Psoriasis, unspecified: Secondary | ICD-10-CM

## 2020-11-28 ENCOUNTER — Ambulatory Visit (INDEPENDENT_AMBULATORY_CARE_PROVIDER_SITE_OTHER): Payer: Medicare HMO | Admitting: Family Medicine

## 2020-11-28 ENCOUNTER — Encounter: Payer: Self-pay | Admitting: Family Medicine

## 2020-11-28 ENCOUNTER — Other Ambulatory Visit: Payer: Self-pay

## 2020-11-28 VITALS — BP 126/65 | HR 75 | Temp 97.7°F | Ht 67.0 in | Wt 213.2 lb

## 2020-11-28 DIAGNOSIS — R600 Localized edema: Secondary | ICD-10-CM | POA: Diagnosis not present

## 2020-11-28 NOTE — Progress Notes (Signed)
Subjective:  Patient ID: Joseph Shields., male    DOB: 06/06/1945  Age: 75 y.o. MRN: [id number]  CC: Edema (Bilateral ankles)   HPI Joseph Shields. presents for onset 3 weeks ago of edema in the legs. Worse on right. More increase over last 2-3 days. Eating a lot of salt on fresh tomatoes. No dyspnea or chest pain. One sore at right mid calf medially.   Depression screen [facility] 2/9 11/28/2020 09/18/2020 09/01/2020  Decreased Interest 0 0 0  Down, Depressed, Hopeless 0 0 0  PHQ - 2 Score 0 0 0    History Joseph Shields has a past medical history of CAD (coronary artery disease), COPD (chronic obstructive pulmonary disease) ([place]), History of tobacco abuse, Hyperlipidemia, Morbid obesity ([place]), and Psoriasis.   Joseph Shields has a past surgical history that includes Spine surgery and left heart catheterization with coronary angiogram (N/A, 05/06/2014).   His family history includes Allergies in his mother; Asthma in his mother; Cancer in his mother; Healthy in his daughter and daughter; Heart disease (age of onset: 73) in his father; Stroke (age of onset: 2) in his mother.Joseph Shields reports that Joseph Shields quit smoking about 6 years ago. His smoking use included cigarettes. Joseph Shields has a 24.50 pack-year smoking history. Joseph Shields has never used smokeless tobacco. Joseph Shields reports that Joseph Shields does not drink alcohol and does not use drugs.    ROS Review of Systems  Constitutional:  Negative for fever.  Respiratory:  Negative for shortness of breath.   Cardiovascular:  Positive for leg swelling. Negative for chest pain.  Musculoskeletal:  Negative for arthralgias.  Skin:  Negative for rash.   Objective:  BP 126/65   Pulse 75   Temp 97.7 F (36.5 C)   Ht 5' 7"  (1.702 m)   Wt 213 lb 3.2 oz (96.7 kg)   SpO2 91%   BMI 33.39 kg/m   BP Readings from Last 3 Encounters:  11/28/20 126/65  09/01/20 136/61  04/23/20 (!) 148/82    Wt Readings from Last 3 Encounters:  11/28/20 213 lb 3.2 oz (96.7 kg)  09/18/20 212 lb (96.2 kg)  09/01/20  213 lb 3.2 oz (96.7 kg)     Physical Exam Vitals reviewed.  Constitutional:      Appearance: Joseph Shields is well-developed.  HENT:     Head: Normocephalic and atraumatic.     Right Ear: External ear normal.     Left Ear: External ear normal.     Mouth/Throat:     Pharynx: No oropharyngeal exudate or posterior oropharyngeal erythema.  Eyes:     Pupils: Pupils are equal, round, and reactive to light.  Cardiovascular:     Rate and Rhythm: Normal rate and regular rhythm.     Heart sounds: No murmur heard. Pulmonary:     Effort: No respiratory distress.     Breath sounds: Normal breath sounds.  Musculoskeletal:     Cervical back: Normal range of motion and neck supple.     Right lower leg: Edema (3+ with mildly hyperemic varicosities and blue spider veins R>L) present.  Skin:    General: Skin is warm and dry.  Neurological:     Mental Status: Joseph Shields is alert and oriented to person, place, and time.      Assessment & Plan:   Joseph Shields was seen today for edema.  Diagnoses and all orders for this visit:  Bilateral leg edema -     CBC with Differential/Platelet -     CMP14+EGFR -  D-dimer, quantitative      I am having Joseph Shields. maintain his aspirin EC, pyridOXINE, triamcinolone ointment, rosuvastatin, metoprolol succinate, albuterol, methotrexate, and folic acid.  Allergies as of 11/28/2020       Reactions   Flonase [fluticasone Propionate] Other (See Comments)   Nose bleeds.   Penicillins Other (See Comments)   REACTION: unknown        Medication List        Accurate as of November 28, 2020 10:57 AM. If you have any questions, ask your nurse or doctor.          albuterol 108 (90 Base) MCG/ACT inhaler Commonly known as: VENTOLIN HFA Inhale 2 puffs into the lungs every 6 (six) hours as needed for wheezing or shortness of breath.   aspirin EC 81 MG tablet Take 81 mg by mouth daily.   folic acid 1 MG tablet Commonly known as: FOLVITE TAKE 1 TABLET EVERY  DAY   methotrexate 2.5 MG tablet TAKE 4 TABLETS ONE TIME WEEKLY (CAUTION: CHEMOTHERAPY. PROTECT FROM LIGHT)   metoprolol succinate 25 MG 24 hr tablet Commonly known as: TOPROL-XL Take 1 tablet (25 mg total) by mouth daily.   pyridOXINE 50 MG tablet Commonly known as: B-6 Take 1 tablet (50 mg total) by mouth daily.   rosuvastatin 20 MG tablet Commonly known as: CRESTOR Take 1 tablet (20 mg total) by mouth daily.   triamcinolone ointment 0.1 % Commonly known as: KENALOG APPLY ONE APPLICATION TOPICALLY TWO TIMES A DAY         Follow-up: Return if symptoms worsen or fail to improve.  [name], M.D.

## 2020-11-29 LAB — CBC WITH DIFFERENTIAL/PLATELET
Basophils Absolute: 0 10*3/uL (ref 0.0–0.2)
Basos: 1 %
EOS (ABSOLUTE): 0.4 10*3/uL (ref 0.0–0.4)
Eos: 5 %
Hematocrit: 44.7 % (ref 37.5–51.0)
Hemoglobin: 14.8 g/dL (ref 13.0–17.7)
Immature Grans (Abs): 0 10*3/uL (ref 0.0–0.1)
Immature Granulocytes: 0 %
Lymphocytes Absolute: 1.8 10*3/uL (ref 0.7–3.1)
Lymphs: 23 %
MCH: 31.6 pg (ref 26.6–33.0)
MCHC: 33.1 g/dL (ref 31.5–35.7)
MCV: 95 fL (ref 79–97)
Monocytes Absolute: 1 10*3/uL — ABNORMAL HIGH (ref 0.1–0.9)
Monocytes: 13 %
Neutrophils Absolute: 4.5 10*3/uL (ref 1.4–7.0)
Neutrophils: 58 %
Platelets: 171 10*3/uL (ref 150–450)
RBC: 4.69 x10E6/uL (ref 4.14–5.80)
RDW: 13.4 % (ref 11.6–15.4)
WBC: 7.8 10*3/uL (ref 3.4–10.8)

## 2020-11-29 LAB — CMP14+EGFR
ALT: 19 IU/L (ref 0–44)
AST: 26 IU/L (ref 0–40)
Albumin/Globulin Ratio: 1.8 (ref 1.2–2.2)
Albumin: 4.6 g/dL (ref 3.7–4.7)
Alkaline Phosphatase: 59 IU/L (ref 44–121)
BUN/Creatinine Ratio: 15 (ref 10–24)
BUN: 16 mg/dL (ref 8–27)
Bilirubin Total: 0.8 mg/dL (ref 0.0–1.2)
CO2: 24 mmol/L (ref 20–29)
Calcium: 10 mg/dL (ref 8.6–10.2)
Chloride: 103 mmol/L (ref 96–106)
Creatinine, Ser: 1.08 mg/dL (ref 0.76–1.27)
Globulin, Total: 2.5 g/dL (ref 1.5–4.5)
Glucose: 100 mg/dL — ABNORMAL HIGH (ref 65–99)
Potassium: 5.2 mmol/L (ref 3.5–5.2)
Sodium: 141 mmol/L (ref 134–144)
Total Protein: 7.1 g/dL (ref 6.0–8.5)
eGFR: 72 mL/min/{1.73_m2} (ref >59)

## 2020-11-29 LAB — D-DIMER, QUANTITATIVE: D-DIMER: 2.02 mg/L FEU — ABNORMAL HIGH (ref 0.00–0.49)

## 2020-12-01 ENCOUNTER — Telehealth: Payer: Self-pay | Admitting: Family Medicine

## 2020-12-01 ENCOUNTER — Other Ambulatory Visit: Payer: Self-pay | Admitting: Family Medicine

## 2020-12-01 DIAGNOSIS — R600 Localized edema: Secondary | ICD-10-CM

## 2020-12-01 DIAGNOSIS — R7989 Other specified abnormal findings of blood chemistry: Secondary | ICD-10-CM

## 2020-12-01 NOTE — Telephone Encounter (Signed)
Stable labs

## 2020-12-01 NOTE — Telephone Encounter (Signed)
Thank you for pointing that out. D-dimer is elevated. If he is having any chest pain or shortness of breath, he needs to go to the ER. If he is only having bilaterally edema, I will order bilateral US stat at [facility] to rule out DVTs.

## 2020-12-01 NOTE — Telephone Encounter (Signed)
Patient is aware of results but I was unsure what to tell him about his D-dimer.

## 2020-12-01 NOTE — Telephone Encounter (Signed)
Patient aware of results.  He is not  having any shortness of breath or chest pain.  His legs are still swollen but he feels the swelling has decreased some.

## 2020-12-02 ENCOUNTER — Ambulatory Visit (HOSPITAL_COMMUNITY)
Admission: RE | Admit: 2020-12-02 | Discharge: 2020-12-02 | Disposition: A | Discharge status: Home / Self Care | Payer: Medicare HMO | Source: Ambulatory Visit | Attending: Family Medicine | Admitting: Family Medicine

## 2020-12-02 ENCOUNTER — Other Ambulatory Visit: Payer: Self-pay

## 2020-12-02 DIAGNOSIS — R7989 Other specified abnormal findings of blood chemistry: Secondary | ICD-10-CM | POA: Diagnosis not present

## 2020-12-02 DIAGNOSIS — R600 Localized edema: Secondary | ICD-10-CM | POA: Insufficient documentation

## 2020-12-23 ENCOUNTER — Encounter: Payer: Self-pay | Admitting: Family Medicine

## 2021-02-04 ENCOUNTER — Other Ambulatory Visit: Payer: Self-pay | Admitting: Cardiology

## 2021-02-04 DIAGNOSIS — I25118 Atherosclerotic heart disease of native coronary artery with other forms of angina pectoris: Secondary | ICD-10-CM

## 2021-02-04 DIAGNOSIS — I10 Essential (primary) hypertension: Secondary | ICD-10-CM

## 2021-02-09 DIAGNOSIS — Z01818 Encounter for other preprocedural examination: Secondary | ICD-10-CM | POA: Diagnosis not present

## 2021-02-09 DIAGNOSIS — H25812 Combined forms of age-related cataract, left eye: Secondary | ICD-10-CM | POA: Diagnosis not present

## 2021-02-20 DIAGNOSIS — H25812 Combined forms of age-related cataract, left eye: Secondary | ICD-10-CM | POA: Diagnosis not present

## 2021-02-27 DIAGNOSIS — H04123 Dry eye syndrome of bilateral lacrimal glands: Secondary | ICD-10-CM | POA: Diagnosis not present

## 2021-03-04 ENCOUNTER — Other Ambulatory Visit: Payer: Self-pay | Admitting: Cardiology

## 2021-03-04 ENCOUNTER — Encounter: Payer: Self-pay | Admitting: Family Medicine

## 2021-03-04 ENCOUNTER — Ambulatory Visit (INDEPENDENT_AMBULATORY_CARE_PROVIDER_SITE_OTHER): Payer: Medicare HMO | Admitting: Family Medicine

## 2021-03-04 ENCOUNTER — Other Ambulatory Visit: Payer: Self-pay

## 2021-03-04 VITALS — BP 122/73 | HR 61 | Temp 98.2°F | Ht 67.0 in | Wt 204.6 lb

## 2021-03-04 DIAGNOSIS — E782 Mixed hyperlipidemia: Secondary | ICD-10-CM | POA: Diagnosis not present

## 2021-03-04 DIAGNOSIS — I10 Essential (primary) hypertension: Secondary | ICD-10-CM | POA: Diagnosis not present

## 2021-03-04 DIAGNOSIS — L409 Psoriasis, unspecified: Secondary | ICD-10-CM

## 2021-03-04 MED ORDER — METHOTREXATE SODIUM 2.5 MG PO TABS: ORAL_TABLET | ORAL | 3 refills | Status: DC

## 2021-03-04 MED ORDER — ROSUVASTATIN CALCIUM 20 MG PO TABS: 20.0000 mg | ORAL_TABLET | Freq: Every day | ORAL | 2 refills | Status: DC

## 2021-03-04 NOTE — Progress Notes (Signed)
Subjective:  Patient ID: Joseph Norman., male    DOB: Oct 10, 1945  Age: 75 y.o. MRN: [id number]  CC: Medical Management of Chronic Issues   HPI Joseph Vondrak. presents for follow-up of elevated cholesterol. Doing well without complaints on current medication. Denies side effects of statin including myalgia and arthralgia and nausea. Also in today for liver function testing. Currently no chest pain, shortness of breath or other cardiovascular related symptoms noted.   presents for  follow-up of hypertension. Patient has no history of headache chest pain or shortness of breath or recent cough. Patient also denies symptoms of TIA such as focal numbness or weakness. Patient denies side effects from medication. States taking it regularly.  History Joseph Shields has a past medical history of CAD (coronary artery disease), COPD (chronic obstructive pulmonary disease) ([place]), History of tobacco abuse, Hyperlipidemia, Morbid obesity ([place]), and Psoriasis.   Joseph Shields has a past surgical history that includes Spine surgery; left heart catheterization with coronary angiogram (N/A, 05/06/2014); and Cataract extraction, bilateral.   His family history includes Allergies in his mother; Asthma in his mother; Cancer in his mother; Healthy in his daughter and daughter; Heart disease (age of onset: 62) in his father; Stroke (age of onset: 44) in his mother.Joseph Shields reports that Joseph Shields quit smoking about 6 years ago. His smoking use included cigarettes. Joseph Shields has a 24.50 pack-year smoking history. Joseph Shields has never used smokeless tobacco. Joseph Shields reports that Joseph Shields does not drink alcohol and does not use drugs.  Current Outpatient Medications on File Prior to Visit  Medication Sig Dispense Refill   albuterol (VENTOLIN HFA) 108 (90 Base) MCG/ACT inhaler Inhale 2 puffs into the lungs every 6 (six) hours as needed for wheezing or shortness of breath. 8 g 2   aspirin EC 81 MG tablet Take 81 mg by mouth daily.     folic acid (FOLVITE) 1 MG tablet  TAKE 1 TABLET EVERY DAY 90 tablet 1   metoprolol succinate (TOPROL-XL) 25 MG 24 hr tablet TAKE 1 TABLET EVERY DAY 90 tablet 2   pyridOXINE (B-6) 50 MG tablet Take 1 tablet (50 mg total) by mouth daily. 90 tablet 3   triamcinolone ointment (KENALOG) 0.1 % APPLY ONE APPLICATION TOPICALLY TWO TIMES A DAY 454 g 2   No current facility-administered medications on file prior to visit.    ROS Review of Systems  Constitutional:  Negative for fever.  Respiratory:  Negative for shortness of breath.   Cardiovascular:  Negative for chest pain.  Musculoskeletal:  Negative for arthralgias.  Skin:  Negative for rash.   Objective:  BP 122/73   Pulse 61   Temp 98.2 F (36.8 C)   Ht 5' 7"  (1.702 m)   Wt 204 lb 9.6 oz (92.8 kg)   SpO2 94%   BMI 32.04 kg/m   BP Readings from Last 3 Encounters:  03/04/21 122/73  11/28/20 126/65  09/01/20 136/61    Wt Readings from Last 3 Encounters:  03/04/21 204 lb 9.6 oz (92.8 kg)  11/28/20 213 lb 3.2 oz (96.7 kg)  09/18/20 212 lb (96.2 kg)     Physical Exam Vitals reviewed.  Constitutional:      Appearance: Joseph Shields is well-developed.  HENT:     Head: Normocephalic and atraumatic.     Right Ear: External ear normal.     Left Ear: External ear normal.     Mouth/Throat:     Pharynx: No oropharyngeal exudate or posterior oropharyngeal erythema.  Eyes:  Pupils: Pupils are equal, round, and reactive to light.  Cardiovascular:     Rate and Rhythm: Normal rate and regular rhythm.     Heart sounds: No murmur heard. Pulmonary:     Effort: No respiratory distress.     Breath sounds: Normal breath sounds.  Musculoskeletal:     Cervical back: Normal range of motion and neck supple.  Neurological:     Mental Status: Joseph Shields is alert and oriented to person, place, and time.    Lab Results  Component Value Date   HGBA1C 5.7 04/21/2018   HGBA1C 5.9 10/17/2017   HGBA1C 5.4 05/05/2017    Lab Results  Component Value Date   WBC 7.8 11/28/2020   HGB 14.8  11/28/2020   HCT 44.7 11/28/2020   PLT 171 11/28/2020   GLUCOSE 100 (H) 11/28/2020   CHOL 130 09/01/2020   TRIG 80 09/01/2020   HDL 45 09/01/2020   LDLDIRECT 71 05/10/2016   LDLCALC 69 09/01/2020   ALT 19 11/28/2020   AST 26 11/28/2020   NA 141 11/28/2020   K 5.2 11/28/2020   CL 103 11/28/2020   CREATININE 1.08 11/28/2020   BUN 16 11/28/2020   CO2 24 11/28/2020   TSH 1.380 10/17/2017   INR 1.0 05/03/2014   HGBA1C 5.7 04/21/2018    US Venous Img Lower Bilateral  Result Date: 12/02/2020 CLINICAL DATA:  Bilateral lower extremity edema. EXAM: BILATERAL LOWER EXTREMITY VENOUS DOPPLER ULTRASOUND TECHNIQUE: Gray-scale sonography with graded compression, as well as color Doppler and duplex ultrasound were performed to evaluate the lower extremity deep venous systems from the level of the common femoral vein and including the common femoral, femoral, profunda femoral, popliteal and calf veins including the posterior tibial, peroneal and gastrocnemius veins when visible. The superficial great saphenous vein was also interrogated. Spectral Doppler was utilized to evaluate flow at rest and with distal augmentation maneuvers in the common femoral, femoral and popliteal veins. COMPARISON:  None. FINDINGS: RIGHT LOWER EXTREMITY Common Femoral Vein: No evidence of thrombus. Normal compressibility, respiratory phasicity and response to augmentation. Saphenofemoral Junction: No evidence of thrombus. Normal compressibility and flow on color Doppler imaging. Profunda Femoral Vein: No evidence of thrombus. Normal compressibility and flow on color Doppler imaging. Femoral Vein: No evidence of thrombus. Normal compressibility, respiratory phasicity and response to augmentation. Popliteal Vein: No evidence of thrombus. Normal compressibility, respiratory phasicity and response to augmentation. Calf Veins: No evidence of thrombus. Normal compressibility and flow on color Doppler imaging. Superficial Great Saphenous  Vein: No evidence of thrombus. Normal compressibility. Venous Reflux:  None. Other Findings:  None. LEFT LOWER EXTREMITY Common Femoral Vein: No evidence of thrombus. Normal compressibility, respiratory phasicity and response to augmentation. Saphenofemoral Junction: No evidence of thrombus. Normal compressibility and flow on color Doppler imaging. Profunda Femoral Vein: No evidence of thrombus. Normal compressibility and flow on color Doppler imaging. Femoral Vein: No evidence of thrombus. Normal compressibility, respiratory phasicity and response to augmentation. Popliteal Vein: No evidence of thrombus. Normal compressibility, respiratory phasicity and response to augmentation. Calf Veins: No evidence of thrombus. Normal compressibility and flow on color Doppler imaging. Superficial Great Saphenous Vein: No evidence of thrombus. Normal compressibility. Venous Reflux:  None. Other Findings:  None. IMPRESSION: No evidence of deep venous thrombosis in either lower extremity. Electronically Signed   By: Marijo Conception M.D.   On: 12/02/2020 14:43    Assessment & Plan:   Joseph Shields was seen today for medical management of chronic issues.  Diagnoses and all  orders for this visit:  Mixed hyperlipidemia -     Lipid panel -     rosuvastatin (CRESTOR) 20 MG tablet; Take 1 tablet (20 mg total) by mouth daily.  Essential hypertension -     CBC with Differential/Platelet -     CMP14+EGFR  Psoriasis -     methotrexate 2.5 MG tablet; TAKE 4 TABLETS ONE TIME WEEKLY (CAUTION: CHEMOTHERAPY. PROTECT FROM LIGHT)  I am having Joseph Shields. maintain his aspirin EC, pyridOXINE, triamcinolone ointment, albuterol, folic acid, metoprolol succinate, rosuvastatin, and methotrexate.  Meds ordered this encounter  Medications   rosuvastatin (CRESTOR) 20 MG tablet    Sig: Take 1 tablet (20 mg total) by mouth daily.    Dispense:  90 tablet    Refill:  2   methotrexate 2.5 MG tablet    Sig: TAKE 4 TABLETS ONE TIME  WEEKLY (CAUTION: CHEMOTHERAPY. PROTECT FROM LIGHT)    Dispense:  48 tablet    Refill:  3     Follow-up: No follow-ups on file.  [name], M.D.

## 2021-03-05 LAB — LIPID PANEL
Chol/HDL Ratio: 2.8 ratio (ref 0.0–5.0)
Cholesterol, Total: 125 mg/dL (ref 100–199)
HDL: 44 mg/dL (ref >39)
LDL Chol Calc (NIH): 64 mg/dL (ref 0–99)
Triglycerides: 88 mg/dL (ref 0–149)
VLDL Cholesterol Cal: 17 mg/dL (ref 5–40)

## 2021-03-05 LAB — CBC WITH DIFFERENTIAL/PLATELET
Basophils Absolute: 0.1 10*3/uL (ref 0.0–0.2)
Basos: 1 %
EOS (ABSOLUTE): 0.3 10*3/uL (ref 0.0–0.4)
Eos: 5 %
Hematocrit: 43.1 % (ref 37.5–51.0)
Hemoglobin: 14.4 g/dL (ref 13.0–17.7)
Immature Grans (Abs): 0 10*3/uL (ref 0.0–0.1)
Immature Granulocytes: 0 %
Lymphocytes Absolute: 1.7 10*3/uL (ref 0.7–3.1)
Lymphs: 23 %
MCH: 32.5 pg (ref 26.6–33.0)
MCHC: 33.4 g/dL (ref 31.5–35.7)
MCV: 97 fL (ref 79–97)
Monocytes Absolute: 1 10*3/uL — ABNORMAL HIGH (ref 0.1–0.9)
Monocytes: 13 %
Neutrophils Absolute: 4.3 10*3/uL (ref 1.4–7.0)
Neutrophils: 58 %
Platelets: 164 10*3/uL (ref 150–450)
RBC: 4.43 x10E6/uL (ref 4.14–5.80)
RDW: 13.3 % (ref 11.6–15.4)
WBC: 7.4 10*3/uL (ref 3.4–10.8)

## 2021-03-05 LAB — CMP14+EGFR
ALT: 24 IU/L (ref 0–44)
AST: 28 IU/L (ref 0–40)
Albumin/Globulin Ratio: 2 (ref 1.2–2.2)
Albumin: 4.5 g/dL (ref 3.7–4.7)
Alkaline Phosphatase: 53 IU/L (ref 44–121)
BUN/Creatinine Ratio: 14 (ref 10–24)
BUN: 15 mg/dL (ref 8–27)
Bilirubin Total: 0.9 mg/dL (ref 0.0–1.2)
CO2: 24 mmol/L (ref 20–29)
Calcium: 9.3 mg/dL (ref 8.6–10.2)
Chloride: 102 mmol/L (ref 96–106)
Creatinine, Ser: 1.08 mg/dL (ref 0.76–1.27)
Globulin, Total: 2.2 g/dL (ref 1.5–4.5)
Glucose: 96 mg/dL (ref 70–99)
Potassium: 4.9 mmol/L (ref 3.5–5.2)
Sodium: 139 mmol/L (ref 134–144)
Total Protein: 6.7 g/dL (ref 6.0–8.5)
eGFR: 72 mL/min/{1.73_m2} (ref >59)

## 2021-03-06 NOTE — Progress Notes (Signed)
Hello Jerrad,  Your lab result is normal and/or stable.Some minor variations that are not significant are commonly marked abnormal, but do not represent any medical problem for you.  Best regards, Nizar Cutler, M.D.

## 2021-05-15 ENCOUNTER — Encounter: Payer: Self-pay | Admitting: Cardiology

## 2021-05-15 ENCOUNTER — Other Ambulatory Visit: Payer: Self-pay

## 2021-05-15 ENCOUNTER — Ambulatory Visit: Payer: Medicare HMO | Admitting: Cardiology

## 2021-05-15 VITALS — BP 128/82 | HR 62 | Ht 67.0 in | Wt 206.6 lb

## 2021-05-15 DIAGNOSIS — I10 Essential (primary) hypertension: Secondary | ICD-10-CM | POA: Diagnosis not present

## 2021-05-15 DIAGNOSIS — I25118 Atherosclerotic heart disease of native coronary artery with other forms of angina pectoris: Secondary | ICD-10-CM

## 2021-05-15 DIAGNOSIS — E782 Mixed hyperlipidemia: Secondary | ICD-10-CM | POA: Diagnosis not present

## 2021-05-15 DIAGNOSIS — E669 Obesity, unspecified: Secondary | ICD-10-CM

## 2021-05-15 MED ORDER — ROSUVASTATIN CALCIUM 20 MG PO TABS: 20.0000 mg | ORAL_TABLET | Freq: Every day | ORAL | 3 refills | Status: DC

## 2021-05-15 MED ORDER — METOPROLOL SUCCINATE ER 25 MG PO TB24: 25.0000 mg | ORAL_TABLET | Freq: Every day | ORAL | 3 refills | Status: DC

## 2021-05-15 NOTE — Progress Notes (Signed)
Cardiology Office Note:    Date:  05/15/2021   ID:  Joseph Norman., DOB Sep 19, 1945, MRN [id number]  PCP:  [name], MD  Cardiologist:  Fransico Him, MD    Referring MD: [name], MD   Chief Complaint  Patient presents with   Coronary Artery Disease   Hypertension   Hyperlipidemia    History of Present Illness:    Joseph Villella. is a 76 y.o. 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  He also has a history of hyperlipidemia and COPD.  He was started on lipitor and stopped it due to muscle aches and remains on Crestor.  He also stopped coreg because he said it made his legs swell and he is now on metoprolol.    He is here today for followup and is doing well.  He has chronic DOE related to COPD which he says is stable.  He denies any chest pain or pressure,  PND, orthopnea, LE edema, dizziness, palpitations or syncope. He is compliant with his meds and is tolerating meds with no SE.     Past Medical History:  Diagnosis Date   CAD (coronary artery disease)    a. 03/2014 NSTEMI - initially refused cath;  b. 03/2014 Echo: EF 55-60%;  c. 04/2014 Cath: LM nl, LAD 20-30p, 15m, D1 30-40, D2 small, 60-70, LCX nl, RCA 100p (CTO), L->R collaterals, EF 55-60%-->Med Rx.   COPD (chronic obstructive pulmonary disease) ([place])    History of tobacco abuse    a. 40+ pack years, quit 03/2014.   Hyperlipidemia    Morbid obesity ([place])    Psoriasis     Past Surgical History:  Procedure Laterality Date   CATARACT EXTRACTION, BILATERAL     LEFT HEART CATHETERIZATION WITH CORONARY ANGIOGRAM N/A 05/06/2014   Procedure: LEFT HEART CATHETERIZATION WITH CORONARY ANGIOGRAM;  Surgeon: Leonie Man, MD;  Location: [facility] CATH LAB;  Service: Cardiovascular;  Laterality: N/A;   SPINE SURGERY      Current Medications: Current Meds  Medication Sig    albuterol (VENTOLIN HFA) 108 (90 Base) MCG/ACT inhaler Inhale 2 puffs into the lungs every 6 (six) hours as needed for wheezing or shortness of breath.   aspirin EC 81 MG tablet Take 81 mg by mouth daily.   folic acid (FOLVITE) 1 MG tablet TAKE 1 TABLET EVERY DAY   methotrexate 2.5 MG tablet TAKE 4 TABLETS ONE TIME WEEKLY (CAUTION: CHEMOTHERAPY. PROTECT FROM LIGHT)   metoprolol succinate (TOPROL-XL) 25 MG 24 hr tablet TAKE 1 TABLET EVERY DAY   pyridOXINE (B-6) 50 MG tablet Take 1 tablet (50 mg total) by mouth daily.   rosuvastatin (CRESTOR) 20 MG tablet Take 1 tablet (20 mg total) by mouth daily.   triamcinolone ointment (KENALOG) 0.1 % APPLY ONE APPLICATION TOPICALLY TWO TIMES A DAY     Allergies:   Flonase [fluticasone propionate] and Penicillins   Social History   Socioeconomic History   Marital status: Married    Spouse name: Hassan Rowan   Number of children: 2   Years of education: associates degree   Highest education level: Associate degree: occupational, Hotel manager, or vocational program  Occupational History   Occupation: Retired    Comment: Hotel manager  Tobacco Use   Smoking status: Former    Packs/day: 0.50    Years: 49.00    Pack  years: 24.50    Types: Cigarettes    Quit date: 04/07/2014    Years since quitting: 7.1   Smokeless tobacco: Never  Vaping Use   Vaping Use: Never used  Substance and Sexual Activity   Alcohol use: No    Alcohol/week: 0.0 standard drinks   Drug use: No   Sexual activity: Yes  Other Topics Concern   Not on file  Social History Narrative   Lives home with wife. Children live nearby   Social Determinants of Health   Financial Resource Strain: Low Risk    Difficulty of Paying Living Expenses: Not hard at all  Food Insecurity: No Food Insecurity   Worried About Charity fundraiser in the Last Year: Never true   Arboriculturist in the Last Year: Never true  Transportation Needs: No Transportation Needs   Lack of  Transportation (Medical): No   Lack of Transportation (Non-Medical): No  Physical Activity: Insufficiently Active   Days of Exercise per Week: 7 days   Minutes of Exercise per Session: 10 min  Stress: No Stress Concern Present   Feeling of Stress : Not at all  Social Connections: Socially Integrated   Frequency of Communication with Friends and Family: More than three times a week   Frequency of Social Gatherings with Friends and Family: More than three times a week   Attends Religious Services: More than 4 times per year   Active Member of [employer] or Organizations: Yes   Attends Music therapist: More than 4 times per year   Marital Status: Married     Family History: The patient's family history includes Allergies in his mother; Asthma in his mother; Cancer in his mother; Healthy in his daughter and daughter; Heart disease (age of onset: 20) in his father; Stroke (age of onset: 50) in his mother.  ROS:   Please see the history of present illness.    ROS  All other systems reviewed and negative.   EKGs/Labs/Other Studies Reviewed:    The following studies were reviewed today: none  EKG:  EKG is ordered today.  The ekg ordered today demonstrates NSR with iRBBB and no ST changes  Recent Labs: 03/04/2021: ALT 24; BUN 15; Creatinine, Ser 1.08; Hemoglobin 14.4; Platelets 164; Potassium 4.9; Sodium 139   Recent Lipid Panel    Component Value Date/Time   CHOL 125 03/04/2021 1046   TRIG 88 03/04/2021 1046   HDL 44 03/04/2021 1046   CHOLHDL 2.8 03/04/2021 1046   CHOLHDL 2.6 09/29/2015 0914   VLDL 15 09/29/2015 0914   LDLCALC 64 03/04/2021 1046   LDLDIRECT 71 05/10/2016 1639    Physical Exam:    VS:  BP 128/82    Pulse 62    Ht 5\' 7"  (1.702 m)    Wt 206 lb 9.6 oz (93.7 kg)    SpO2 95%    BMI 32.36 kg/m     Wt Readings from Last 3 Encounters:  05/15/21 206 lb 9.6 oz (93.7 kg)  03/04/21 204 lb 9.6 oz (92.8 kg)  11/28/20 213 lb 3.2 oz (96.7 kg)     GEN: Well  nourished, well developed in no acute distress HEENT: Normal NECK: No JVD; No carotid bruits LYMPHATICS: No lymphadenopathy CARDIAC:RRR, no murmurs, rubs, gallops RESPIRATORY:  Clear to auscultation without rales, wheezing or rhonchi  ABDOMEN: Soft, non-tender, non-distended MUSCULOSKELETAL:  No edema; No deformity  SKIN: Warm and dry NEUROLOGIC:  Alert and oriented x 3 PSYCHIATRIC:  Normal  affect    ASSESSMENT:    1. Coronary artery disease of native artery of native heart with stable angina pectoris ([place])   2. Mixed hyperlipidemia   3. Obesity (BMI 30-39.9)   4. Essential hypertension    PLAN:    In order of problems listed above:  1.  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  -he denies any anginal symptoms since I saw him last -continue prescription drug management with ASA 81mg  daily, Toprol XL 25mg  daily and Crestor 20mg  daily with PRN refills  2.  HLD -LDL goal < 70 -I have personally reviewed and interpreted outside labs performed by patient's PCP which showed LDL 64, HDL 44, TAG 88 on 03/04/2021 -continue prescription drug management with Crestor 20mg  daily with PRN refills  3.  Obesity -I have encouraged him to get into a routine exercise program and cut back on carbs and portions.   4.  HTN -Bp is controlled on exam today -continue prescription drug management with Toprol XL 25mg  daily with PRN refills  Medication Adjustments/Labs and Tests Ordered: Current medicines are reviewed at length with the patient today.  Concerns regarding medicines are outlined above.  Orders Placed This Encounter  Procedures   EKG 12-Lead   No orders of the defined types were placed in this encounter.   Signed, Fransico Him, MD  05/15/2021 8:07 AM    [place]

## 2021-05-15 NOTE — Addendum Note (Signed)
Addended by: Antonieta Iba on: 05/15/2021 08:11 AM   Modules accepted: Orders

## 2021-05-15 NOTE — Patient Instructions (Signed)

## 2021-06-17 ENCOUNTER — Other Ambulatory Visit: Payer: Self-pay | Admitting: Family Medicine

## 2021-06-17 DIAGNOSIS — L409 Psoriasis, unspecified: Secondary | ICD-10-CM

## 2021-07-25 ENCOUNTER — Other Ambulatory Visit: Payer: Self-pay | Admitting: Family Medicine

## 2021-09-01 ENCOUNTER — Ambulatory Visit (INDEPENDENT_AMBULATORY_CARE_PROVIDER_SITE_OTHER): Payer: Medicare HMO | Admitting: Family Medicine

## 2021-09-01 ENCOUNTER — Encounter: Payer: Self-pay | Admitting: Family Medicine

## 2021-09-01 VITALS — BP 120/64 | HR 59 | Temp 97.9°F | Ht 67.0 in | Wt 202.4 lb

## 2021-09-01 DIAGNOSIS — L409 Psoriasis, unspecified: Secondary | ICD-10-CM

## 2021-09-01 DIAGNOSIS — I10 Essential (primary) hypertension: Secondary | ICD-10-CM | POA: Diagnosis not present

## 2021-09-01 DIAGNOSIS — E782 Mixed hyperlipidemia: Secondary | ICD-10-CM | POA: Diagnosis not present

## 2021-09-01 MED ORDER — FOLIC ACID 1 MG PO TABS: 1.0000 mg | ORAL_TABLET | Freq: Every day | ORAL | 3 refills | Status: DC

## 2021-09-01 MED ORDER — SPIRIVA RESPIMAT 2.5 MCG/ACT IN AERS: 2.0000 | INHALATION_SPRAY | Freq: Every day | RESPIRATORY_TRACT | 3 refills | Status: DC

## 2021-09-01 MED ORDER — PYRIDOXINE HCL 50 MG PO TABS: 50.0000 mg | ORAL_TABLET | Freq: Every day | ORAL | 3 refills | Status: DC

## 2021-09-01 NOTE — Progress Notes (Signed)
? ?Subjective:  ?Patient ID: Joseph Shields., male    DOB: Dec 20, 1945  Age: 76 y.o. MRN: [id number] ? ?CC: Medical Management of Chronic Issues ? ? ?HPI ?Joseph Diones Sarratt Jr. presents for  follow-up of hypertension. Patient has no history of headache chest pain or shortness of breath or recent cough. Patient also denies symptoms of TIA such as focal numbness or weakness. Patient denies side effects from medication. States taking it regularly. ? ? in for follow-up of elevated cholesterol. Doing well without complaints on current medication. Denies side effects of statin including myalgia and arthralgia and nausea. Currently no chest pain, shortness of breath or other cardiovascular related symptoms noted. ? ?Mild COPD. Using albuterol 2-3 times a week. Interested in maintenance inhaler. ? ? ?History ?Joseph Shields has a past medical history of CAD (coronary artery disease), COPD (chronic obstructive pulmonary disease) ([place]), History of tobacco abuse, Hyperlipidemia, Morbid obesity ([place]), and Psoriasis.  ? ?He has a past surgical history that includes Spine surgery; left heart catheterization with coronary angiogram (N/A, 05/06/2014); and Cataract extraction, bilateral.  ? ?His family history includes Allergies in his mother; Asthma in his mother; Cancer in his mother; Healthy in his daughter and daughter; Heart disease (age of onset: 45) in his father; Stroke (age of onset: 40) in his mother.He reports that he quit smoking about 7 years ago. His smoking use included cigarettes. He has a 24.50 pack-year smoking history. He has never used smokeless tobacco. He reports that he does not drink alcohol and does not use drugs. ? ?Current Outpatient Medications on File Prior to Visit  ?Medication Sig Dispense Refill  ? albuterol (VENTOLIN HFA) 108 (90 Base) MCG/ACT inhaler INHALE 2 PUFFS BY MOUTH EVERY 6 HOURS AS NEEDED FOR WHEEZING FOR SHORTNESS OF BREATH 18 g 0  ? aspirin EC 81 MG tablet Take 81 mg by mouth daily.    ?  methotrexate 2.5 MG tablet TAKE 4 TABLETS ONE TIME WEEKLY (CAUTION: CHEMOTHERAPY. PROTECT FROM LIGHT) 48 tablet 3  ? metoprolol succinate (TOPROL-XL) 25 MG 24 hr tablet Take 1 tablet (25 mg total) by mouth daily. 90 tablet 3  ? rosuvastatin (CRESTOR) 20 MG tablet Take 1 tablet (20 mg total) by mouth daily. 90 tablet 3  ? triamcinolone ointment (KENALOG) 0.1 % APPLY ONE APPLICATION TOPICALLY TWO TIMES A DAY 454 g 2  ? ?No current facility-administered medications on file prior to visit.  ? ? ?ROS ?Review of Systems  ?Constitutional:  Negative for fever.  ?Respiratory:  Positive for shortness of breath (2-3 times a week).   ?Cardiovascular:  Negative for chest pain.  ?Musculoskeletal:  Negative for arthralgias.  ?Skin:  Negative for rash.  ? ?Objective:  ?BP 120/64   Pulse (!) 59   Temp 97.9 ?F (36.6 ?C)   Ht 5' 7" (1.702 m)   Wt 202 lb 6.4 oz (91.8 kg)   SpO2 91%   BMI 31.70 kg/m?  ? ?BP Readings from Last 3 Encounters:  ?09/01/21 120/64  ?05/15/21 128/82  ?03/04/21 122/73  ? ? ?Wt Readings from Last 3 Encounters:  ?09/01/21 202 lb 6.4 oz (91.8 kg)  ?05/15/21 206 lb 9.6 oz (93.7 kg)  ?03/04/21 204 lb 9.6 oz (92.8 kg)  ? ? ? ?Physical Exam ?Vitals reviewed.  ?Constitutional:   ?   Appearance: He is well-developed.  ?HENT:  ?   Head: Normocephalic and atraumatic.  ?   Right Ear: External ear normal.  ?   Left Ear: External ear normal.  ?  Mouth/Throat:  ?   Pharynx: No oropharyngeal exudate or posterior oropharyngeal erythema.  ?Eyes:  ?   Pupils: Pupils are equal, round, and reactive to light.  ?Cardiovascular:  ?   Rate and Rhythm: Normal rate and regular rhythm.  ?   Heart sounds: No murmur heard. ?Pulmonary:  ?   Effort: No respiratory distress.  ?   Breath sounds: Normal breath sounds.  ?Musculoskeletal:  ?   Cervical back: Normal range of motion and neck supple.  ?Neurological:  ?   Mental Status: He is alert and oriented to person, place, and time.  ? ? ? ? ?Assessment & Plan:  ? ?Joseph Shields was seen today  for medical management of chronic issues. ? ?Diagnoses and all orders for this visit: ? ?Essential hypertension ?-     CBC with Differential/Platelet ?-     CMP14+EGFR ? ?Mixed hyperlipidemia ?-     Lipid panel ? ?Psoriasis ?-     folic acid (FOLVITE) 1 MG tablet; Take 1 tablet (1 mg total) by mouth daily. ? ?Other orders ?-     pyridOXINE (B-6) 50 MG tablet; Take 1 tablet (50 mg total) by mouth daily. ?-     Tiotropium Bromide Monohydrate (SPIRIVA RESPIMAT) 2.5 MCG/ACT AERS; Inhale 2 puffs into the lungs daily. ? ? ?Allergies as of 09/01/2021   ? ?   Reactions  ? Flonase [fluticasone Propionate] Other (See Comments)  ? Nose bleeds.  ? Penicillins Other (See Comments)  ? REACTION: unknown  ? ?  ? ?  ?Medication List  ?  ? ?  ? Accurate as of Sep 01, 2021 10:34 AM. If you have any questions, ask your nurse or doctor.  ?  ?  ? ?  ? ?albuterol 108 (90 Base) MCG/ACT inhaler ?Commonly known as: VENTOLIN HFA ?INHALE 2 PUFFS BY MOUTH EVERY 6 HOURS AS NEEDED FOR WHEEZING FOR SHORTNESS OF BREATH ?  ?aspirin EC 81 MG tablet ?Take 81 mg by mouth daily. ?  ?folic acid 1 MG tablet ?Commonly known as: FOLVITE ?Take 1 tablet (1 mg total) by mouth daily. ?  ?methotrexate 2.5 MG tablet ?TAKE 4 TABLETS ONE TIME WEEKLY (CAUTION: CHEMOTHERAPY. PROTECT FROM LIGHT) ?  ?metoprolol succinate 25 MG 24 hr tablet ?Commonly known as: TOPROL-XL ?Take 1 tablet (25 mg total) by mouth daily. ?  ?pyridOXINE 50 MG tablet ?Commonly known as: B-6 ?Take 1 tablet (50 mg total) by mouth daily. ?  ?rosuvastatin 20 MG tablet ?Commonly known as: CRESTOR ?Take 1 tablet (20 mg total) by mouth daily. ?  ?Spiriva Respimat 2.5 MCG/ACT Aers ?Generic drug: Tiotropium Bromide Monohydrate ?Inhale 2 puffs into the lungs daily. ?Started by: [name], MD ?  ?triamcinolone ointment 0.1 % ?Commonly known as: KENALOG ?APPLY ONE APPLICATION TOPICALLY TWO TIMES A DAY ?  ? ?  ? ? ?Meds ordered this encounter  ?Medications  ? folic acid (FOLVITE) 1 MG tablet  ?  Sig: Take 1  tablet (1 mg total) by mouth daily.  ?  Dispense:  90 tablet  ?  Refill:  3  ? pyridOXINE (B-6) 50 MG tablet  ?  Sig: Take 1 tablet (50 mg total) by mouth daily.  ?  Dispense:  90 tablet  ?  Refill:  3  ? Tiotropium Bromide Monohydrate (SPIRIVA RESPIMAT) 2.5 MCG/ACT AERS  ?  Sig: Inhale 2 puffs into the lungs daily.  ?  Dispense:  3 each  ?  Refill:  3  ? ? ? ? ?Follow-up:  Return in about 6 months (around 03/04/2022). ? ?[name], M.D. ?

## 2021-09-02 LAB — CBC WITH DIFFERENTIAL/PLATELET
Basophils Absolute: 0 10*3/uL (ref 0.0–0.2)
Basos: 1 %
EOS (ABSOLUTE): 0.4 10*3/uL (ref 0.0–0.4)
Eos: 6 %
Hematocrit: 43.8 % (ref 37.5–51.0)
Hemoglobin: 14.4 g/dL (ref 13.0–17.7)
Immature Grans (Abs): 0 10*3/uL (ref 0.0–0.1)
Immature Granulocytes: 0 %
Lymphocytes Absolute: 2 10*3/uL (ref 0.7–3.1)
Lymphs: 27 %
MCH: 31.7 pg (ref 26.6–33.0)
MCHC: 32.9 g/dL (ref 31.5–35.7)
MCV: 97 fL (ref 79–97)
Monocytes Absolute: 0.9 10*3/uL (ref 0.1–0.9)
Monocytes: 13 %
Neutrophils Absolute: 3.9 10*3/uL (ref 1.4–7.0)
Neutrophils: 53 %
Platelets: 152 10*3/uL (ref 150–450)
RBC: 4.54 x10E6/uL (ref 4.14–5.80)
RDW: 13.5 % (ref 11.6–15.4)
WBC: 7.3 10*3/uL (ref 3.4–10.8)

## 2021-09-02 LAB — CMP14+EGFR
ALT: 22 IU/L (ref 0–44)
AST: 27 IU/L (ref 0–40)
Albumin/Globulin Ratio: 1.7 (ref 1.2–2.2)
Albumin: 4.5 g/dL (ref 3.7–4.7)
Alkaline Phosphatase: 59 IU/L (ref 44–121)
BUN/Creatinine Ratio: 14 (ref 10–24)
BUN: 14 mg/dL (ref 8–27)
Bilirubin Total: 0.8 mg/dL (ref 0.0–1.2)
CO2: 24 mmol/L (ref 20–29)
Calcium: 9.4 mg/dL (ref 8.6–10.2)
Chloride: 102 mmol/L (ref 96–106)
Creatinine, Ser: 0.99 mg/dL (ref 0.76–1.27)
Globulin, Total: 2.7 g/dL (ref 1.5–4.5)
Glucose: 102 mg/dL — ABNORMAL HIGH (ref 70–99)
Potassium: 4.7 mmol/L (ref 3.5–5.2)
Sodium: 140 mmol/L (ref 134–144)
Total Protein: 7.2 g/dL (ref 6.0–8.5)
eGFR: 80 mL/min/{1.73_m2} (ref >59)

## 2021-09-02 LAB — LIPID PANEL
Chol/HDL Ratio: 2.5 ratio (ref 0.0–5.0)
Cholesterol, Total: 133 mg/dL (ref 100–199)
HDL: 53 mg/dL (ref >39)
LDL Chol Calc (NIH): 65 mg/dL (ref 0–99)
Triglycerides: 72 mg/dL (ref 0–149)
VLDL Cholesterol Cal: 15 mg/dL (ref 5–40)

## 2021-09-02 NOTE — Progress Notes (Signed)
Hello Joseph Shields,  Your lab result is normal and/or stable.Some minor variations that are not significant are commonly marked abnormal, but do not represent any medical problem for you.  Best regards, Khaled Herda, M.D.

## 2021-09-07 ENCOUNTER — Encounter: Payer: Self-pay | Admitting: Family Medicine

## 2021-09-07 ENCOUNTER — Ambulatory Visit (INDEPENDENT_AMBULATORY_CARE_PROVIDER_SITE_OTHER): Payer: Medicare HMO | Admitting: Family Medicine

## 2021-09-07 VITALS — BP 99/62 | HR 66 | Temp 97.5°F | Ht 67.0 in | Wt 202.2 lb

## 2021-09-07 DIAGNOSIS — J40 Bronchitis, not specified as acute or chronic: Secondary | ICD-10-CM | POA: Diagnosis not present

## 2021-09-07 DIAGNOSIS — J329 Chronic sinusitis, unspecified: Secondary | ICD-10-CM | POA: Diagnosis not present

## 2021-09-07 MED ORDER — BENZONATATE 200 MG PO CAPS: 200.0000 mg | ORAL_CAPSULE | Freq: Three times a day (TID) | ORAL | 0 refills | Status: DC | PRN

## 2021-09-07 MED ORDER — DOXYCYCLINE HYCLATE 100 MG PO TABS: 100.0000 mg | ORAL_TABLET | Freq: Two times a day (BID) | ORAL | 0 refills | Status: DC

## 2021-09-07 NOTE — Progress Notes (Signed)
? ?Subjective:  ?Patient ID: Joseph Shields., male    DOB: 10/19/1945  Age: 76 y.o. MRN: [id number] ? ?CC: Nasal Congestion and Cough ? ? ?HPI ?Joseph Diones Shutters Jr. presents for Patient presents with upper respiratory congestion. Rhinorrhea that is frequently purulent. There is moderate sore throat. Feels dry and stings. Patient reports coughing frequently as well.  Yellow sputum noted. Had chills last night. No sweats. The patient denies being short of breath. Onset was 2 days ago. Gradually worsening. Tried OTCs without improvement. ? ? ? ?  09/01/2021  ? 10:11 AM 03/04/2021  ? 10:00 AM 11/28/2020  ? 10:23 AM  ?Depression screen PHQ 2/9  ?Decreased Interest 3 0 0  ?Down, Depressed, Hopeless 0 0 0  ?PHQ - 2 Score 3 0 0  ?Altered sleeping 0    ?Tired, decreased energy 0    ?Change in appetite 0    ?Feeling bad or failure about yourself  0    ?Trouble concentrating 0    ?Moving slowly or fidgety/restless 0    ?Suicidal thoughts 0    ?PHQ-9 Score 3    ?Difficult doing work/chores Not difficult at all    ? ? ?History ?Joseph Shields has a past medical history of CAD (coronary artery disease), COPD (chronic obstructive pulmonary disease) ([place]), History of tobacco abuse, Hyperlipidemia, Morbid obesity ([place]), and Psoriasis.  ? ?He has a past surgical history that includes Spine surgery; left heart catheterization with coronary angiogram (N/A, 05/06/2014); and Cataract extraction, bilateral.  ? ?His family history includes Allergies in his mother; Asthma in his mother; Cancer in his mother; Healthy in his daughter and daughter; Heart disease (age of onset: 51) in his father; Stroke (age of onset: 12) in his mother.He reports that he quit smoking about 7 years ago. His smoking use included cigarettes. He has a 24.50 pack-year smoking history. He has never used smokeless tobacco. He reports that he does not drink alcohol and does not use drugs. ? ? ? ?ROS ?Review of Systems  ?Constitutional:  Positive for chills. Negative for fever.   ?HENT:  Positive for congestion.   ?Respiratory:  Positive for cough. Negative for shortness of breath.   ?Cardiovascular:  Negative for chest pain.  ?Musculoskeletal:  Negative for arthralgias.  ?Skin:  Negative for rash.  ? ?Objective:  ?BP 99/62   Pulse 66   Temp (!) 97.5 ?F (36.4 ?C)   Ht '5\' 7"'$  (1.702 m)   Wt 202 lb 3.2 oz (91.7 kg)   SpO2 91%   BMI 31.67 kg/m?  ? ?BP Readings from Last 3 Encounters:  ?09/07/21 99/62  ?09/01/21 120/64  ?05/15/21 128/82  ? ? ?Wt Readings from Last 3 Encounters:  ?09/07/21 202 lb 3.2 oz (91.7 kg)  ?09/01/21 202 lb 6.4 oz (91.8 kg)  ?05/15/21 206 lb 9.6 oz (93.7 kg)  ? ? ? ?Physical Exam ?Vitals reviewed.  ?Constitutional:   ?   Appearance: He is well-developed.  ?HENT:  ?   Head: Normocephalic and atraumatic.  ?   Right Ear: Tympanic membrane and external ear normal.  ?   Left Ear: Tympanic membrane and external ear normal.  ?   Nose: Congestion present.  ?   Comments: Maxillary tenderness ?   Mouth/Throat:  ?   Pharynx: No oropharyngeal exudate or posterior oropharyngeal erythema.  ?Eyes:  ?   Pupils: Pupils are equal, round, and reactive to light.  ?Cardiovascular:  ?   Rate and Rhythm: Normal rate and regular rhythm.  ?  Heart sounds: No murmur heard. ?Pulmonary:  ?   Effort: No respiratory distress.  ?   Breath sounds: Wheezing present.  ?Musculoskeletal:  ?   Cervical back: Normal range of motion and neck supple.  ?Neurological:  ?   Mental Status: He is alert and oriented to person, place, and time.  ? ? ? ? ?Assessment & Plan:  ? ?Joseph Shields was seen today for nasal congestion and cough. ? ?Diagnoses and all orders for this visit: ? ?Sinobronchitis ? ?Other orders ?-     doxycycline (VIBRA-TABS) 100 MG tablet; Take 1 tablet (100 mg total) by mouth 2 (two) times daily. ?-     benzonatate (TESSALON) 200 MG capsule; Take 1 capsule (200 mg total) by mouth 3 (three) times daily as needed for cough. ? ? ? ? ? ? ?I am having Joseph Shields. "W.L." start on benzonatate. I  am also having him maintain his aspirin EC, triamcinolone ointment, methotrexate, metoprolol succinate, rosuvastatin, albuterol, folic acid, pyridOXINE, Spiriva Respimat, and doxycycline. ? ?Allergies as of 09/07/2021   ? ?   Reactions  ? Flonase [fluticasone Propionate] Other (See Comments)  ? Nose bleeds.  ? Penicillins Other (See Comments)  ? REACTION: unknown  ? ?  ? ?  ?Medication List  ?  ? ?  ? Accurate as of Sep 07, 2021  3:46 PM. If you have any questions, ask your nurse or doctor.  ?  ?  ? ?  ? ?albuterol 108 (90 Base) MCG/ACT inhaler ?Commonly known as: VENTOLIN HFA ?INHALE 2 PUFFS BY MOUTH EVERY 6 HOURS AS NEEDED FOR WHEEZING FOR SHORTNESS OF BREATH ?  ?aspirin EC 81 MG tablet ?Take 81 mg by mouth daily. ?  ?benzonatate 200 MG capsule ?Commonly known as: TESSALON ?Take 1 capsule (200 mg total) by mouth 3 (three) times daily as needed for cough. ?Started by: [name], MD ?  ?doxycycline 100 MG tablet ?Commonly known as: VIBRA-TABS ?Take 1 tablet (100 mg total) by mouth 2 (two) times daily. ?Started by: [name], MD ?  ?folic acid 1 MG tablet ?Commonly known as: FOLVITE ?Take 1 tablet (1 mg total) by mouth daily. ?  ?methotrexate 2.5 MG tablet ?TAKE 4 TABLETS ONE TIME WEEKLY (CAUTION: CHEMOTHERAPY. PROTECT FROM LIGHT) ?  ?metoprolol succinate 25 MG 24 hr tablet ?Commonly known as: TOPROL-XL ?Take 1 tablet (25 mg total) by mouth daily. ?  ?pyridOXINE 50 MG tablet ?Commonly known as: B-6 ?Take 1 tablet (50 mg total) by mouth daily. ?  ?rosuvastatin 20 MG tablet ?Commonly known as: CRESTOR ?Take 1 tablet (20 mg total) by mouth daily. ?  ?Spiriva Respimat 2.5 MCG/ACT Aers ?Generic drug: Tiotropium Bromide Monohydrate ?Inhale 2 puffs into the lungs daily. ?  ?triamcinolone ointment 0.1 % ?Commonly known as: KENALOG ?APPLY ONE APPLICATION TOPICALLY TWO TIMES A DAY ?  ? ?  ? ? ? ?Follow-up: Return if symptoms worsen or fail to improve. ? ?[name], M.D. ?

## 2021-09-22 ENCOUNTER — Ambulatory Visit: Payer: Medicare HMO

## 2021-09-28 ENCOUNTER — Telehealth: Payer: Self-pay | Admitting: Family Medicine

## 2021-09-28 NOTE — Telephone Encounter (Signed)
  Left message for patient to call back and schedule Medicare Annual Wellness Visit (AWV) to be completed by video or phone.  No hx of AWV eligible for AWVI per [facility] as of 08/29/2020  Please schedule at anytime with [place] -- 45 Minutes appointment   Any questions, please call me at [phone]

## 2021-10-14 LAB — POC FIT TEST STOOL: Fecal Occult Blood: NEGATIVE

## 2021-11-11 ENCOUNTER — Telehealth: Payer: Self-pay | Admitting: Family Medicine

## 2021-11-11 NOTE — Telephone Encounter (Signed)
Left message for patient to call back and schedule Medicare Annual Wellness Visit (AWV) to be completed by video or phone.   Last AWV: 09/18/2020    Please schedule at anytime with [place]     45 minute appointment  Any questions, please contact me at [phone]

## 2021-11-17 IMAGING — US US EXTREM LOW VENOUS
1 series · 13 of 24 positions shown · non-contrast
Comparison: None.

CLINICAL DATA: Bilateral lower extremity edema.



[Series 1: us venous img lower bilat (dvt) · portal-venous · 13 of 67 slices shown]
[im 1/67]
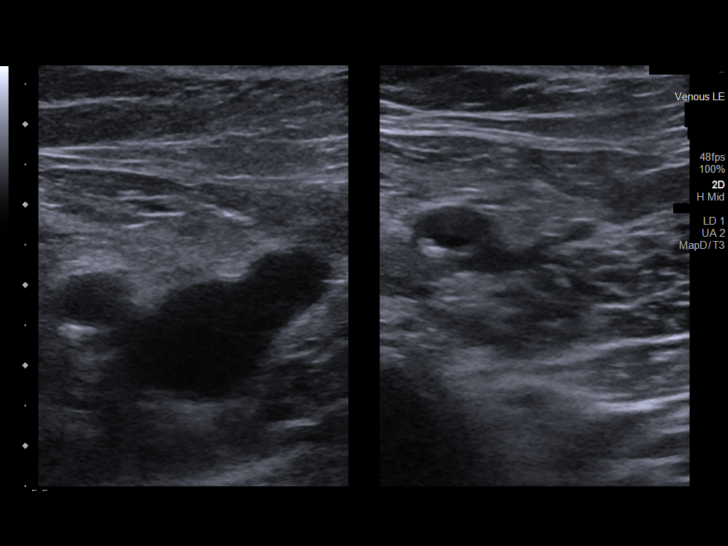
[im 6/67]
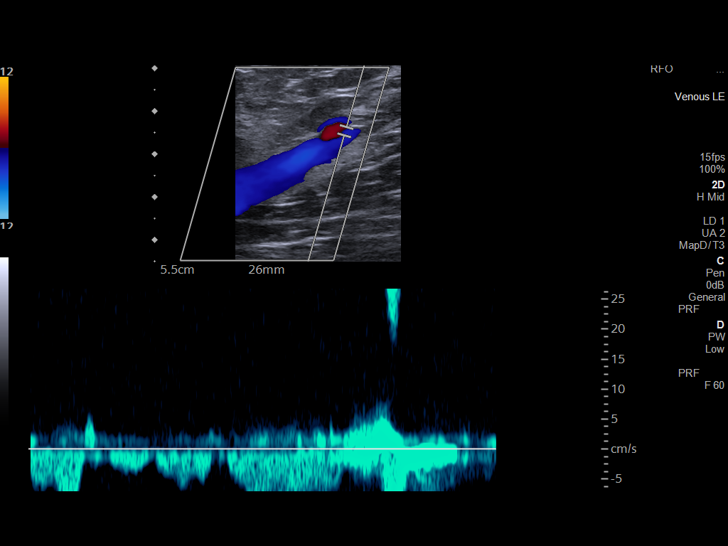
[im 12/67]
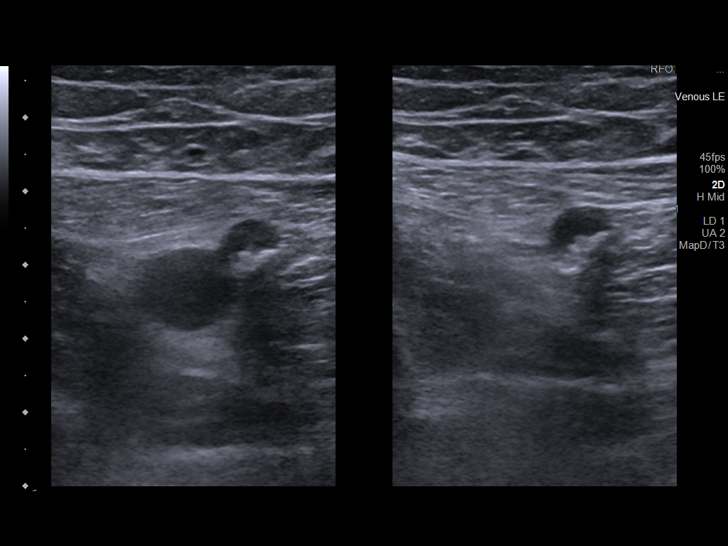
[im 18/67]
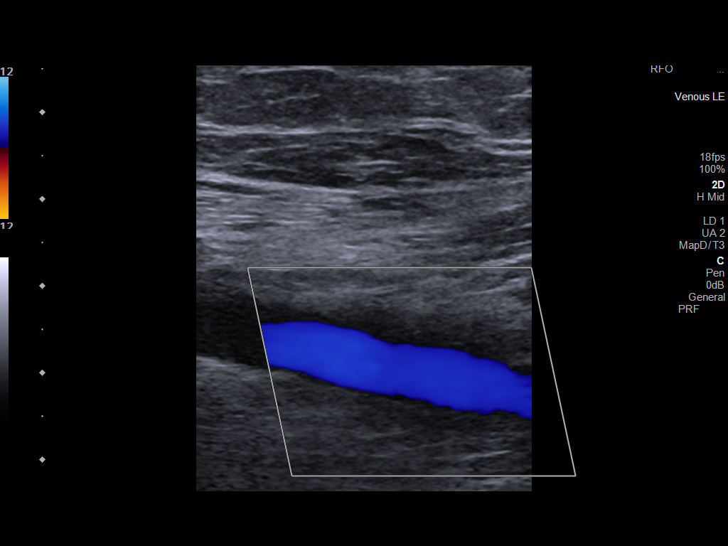
[im 23/67]
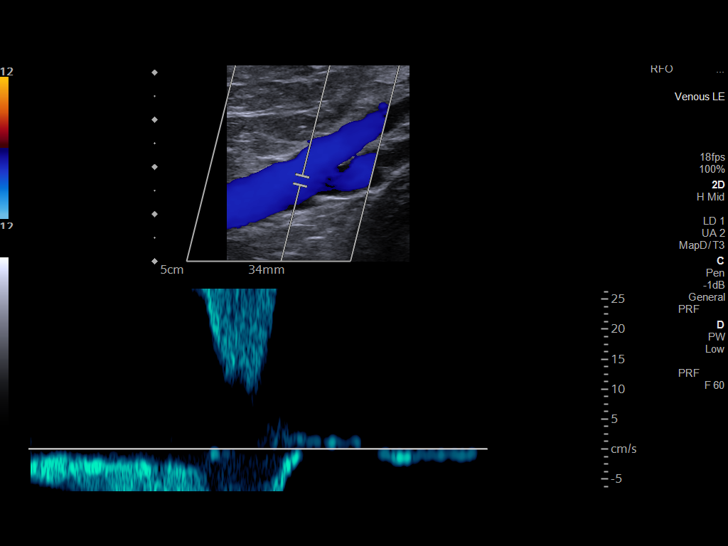
[im 29/67]
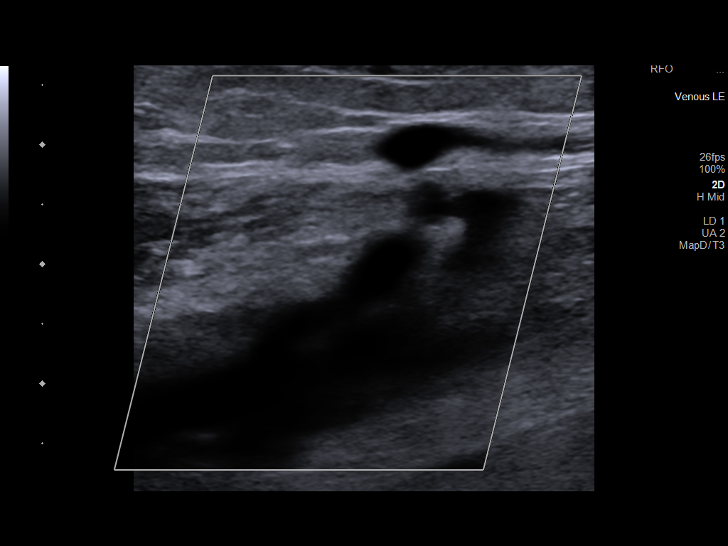
[im 35/67]
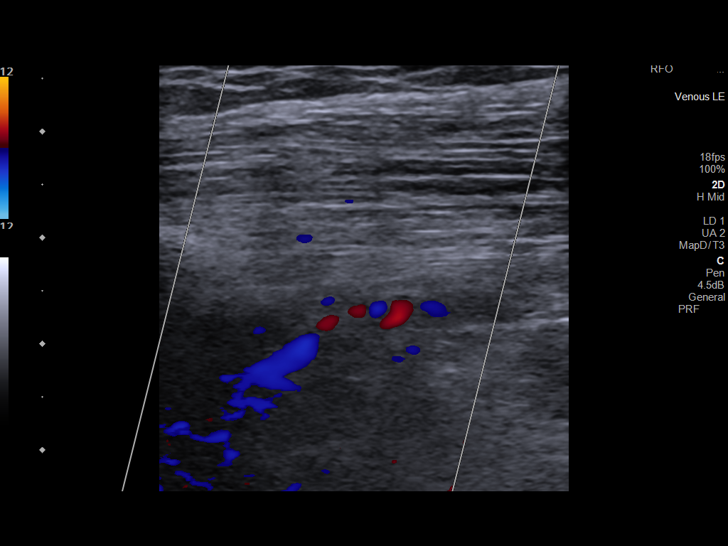
[im 38/67]
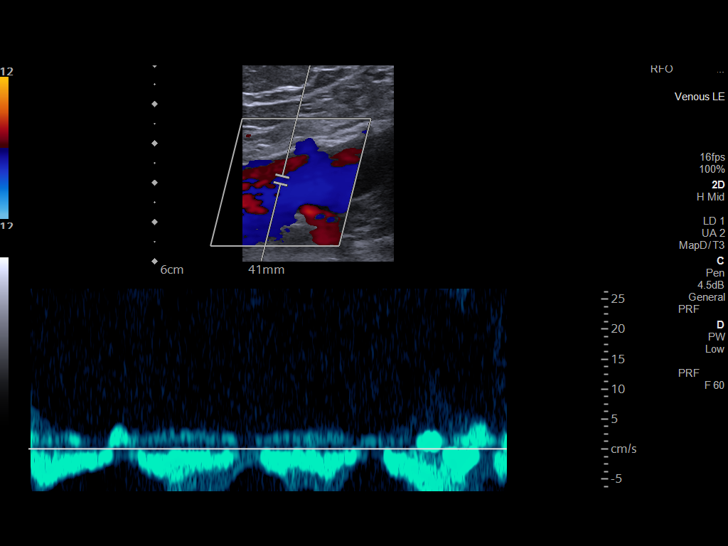
[im 44/67]
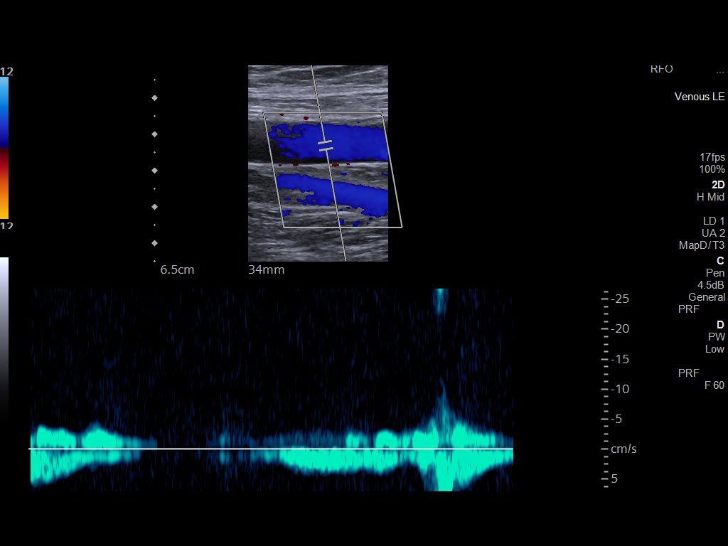
[im 49/67]
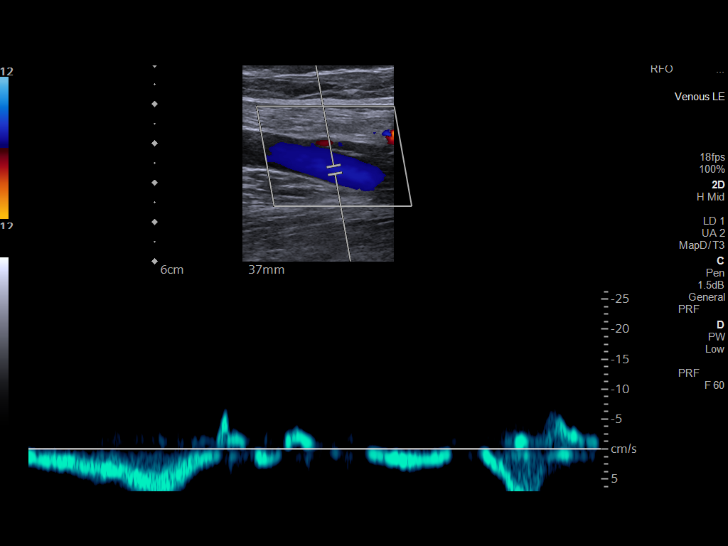
[im 55/67]
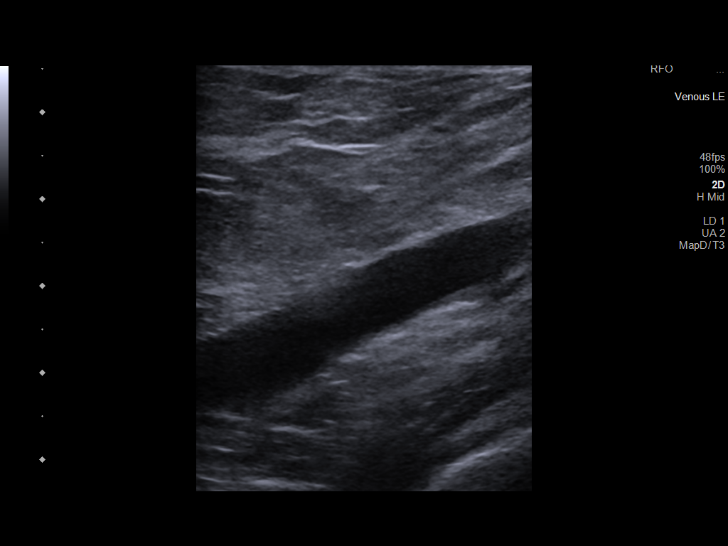
[im 61/67]
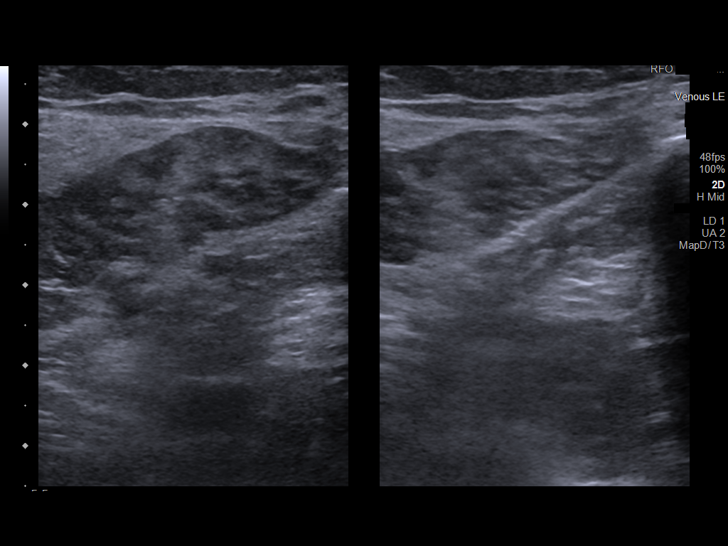
[im 67/67]
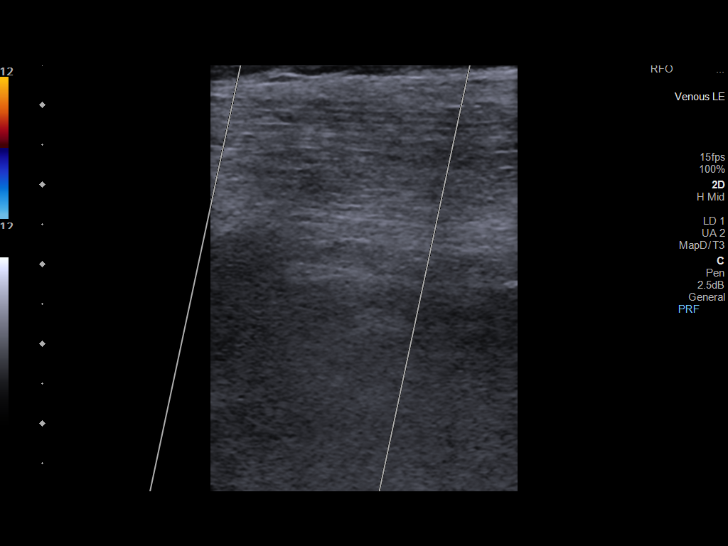

[13 of 24 positions shown; findings below may reference images not displayed]

FINDINGS: RIGHT LOWER EXTREMITY

Common Femoral Vein: No evidence of thrombus. Normal
compressibility, respiratory phasicity and response to augmentation.

Saphenofemoral Junction: No evidence of thrombus. Normal
compressibility and flow on color Doppler imaging.

Profunda Femoral Vein: No evidence of thrombus. Normal
compressibility and flow on color Doppler imaging.

Femoral Vein: No evidence of thrombus. Normal compressibility,
respiratory phasicity and response to augmentation.

Popliteal Vein: No evidence of thrombus. Normal compressibility,
respiratory phasicity and response to augmentation.

Calf Veins: No evidence of thrombus. Normal compressibility and flow
on color Doppler imaging.

Superficial Great Saphenous Vein: No evidence of thrombus. Normal
compressibility.

Venous Reflux:  None.

Other Findings:  None.

LEFT LOWER EXTREMITY

Common Femoral Vein: No evidence of thrombus. Normal
compressibility, respiratory phasicity and response to augmentation.

Saphenofemoral Junction: No evidence of thrombus. Normal
compressibility and flow on color Doppler imaging.

Profunda Femoral Vein: No evidence of thrombus. Normal
compressibility and flow on color Doppler imaging.

Femoral Vein: No evidence of thrombus. Normal compressibility,
respiratory phasicity and response to augmentation.

Popliteal Vein: No evidence of thrombus. Normal compressibility,
respiratory phasicity and response to augmentation.

Calf Veins: No evidence of thrombus. Normal compressibility and flow
on color Doppler imaging.

Superficial Great Saphenous Vein: No evidence of thrombus. Normal
compressibility.

Venous Reflux:  None.

Other Findings:  None.
IMPRESSION: No evidence of deep venous thrombosis in either lower extremity.

## 2021-12-11 ENCOUNTER — Other Ambulatory Visit: Payer: Self-pay | Admitting: Family Medicine

## 2021-12-11 DIAGNOSIS — L409 Psoriasis, unspecified: Secondary | ICD-10-CM

## 2021-12-16 ENCOUNTER — Other Ambulatory Visit: Payer: Self-pay | Admitting: Cardiology

## 2021-12-16 DIAGNOSIS — I25118 Atherosclerotic heart disease of native coronary artery with other forms of angina pectoris: Secondary | ICD-10-CM

## 2021-12-16 DIAGNOSIS — I10 Essential (primary) hypertension: Secondary | ICD-10-CM

## 2022-01-02 ENCOUNTER — Other Ambulatory Visit: Payer: Self-pay | Admitting: Family Medicine

## 2022-01-02 DIAGNOSIS — E782 Mixed hyperlipidemia: Secondary | ICD-10-CM

## 2022-03-04 ENCOUNTER — Other Ambulatory Visit: Payer: Self-pay | Admitting: *Deleted

## 2022-03-04 ENCOUNTER — Ambulatory Visit: Payer: Medicare HMO | Admitting: Family Medicine

## 2022-03-04 DIAGNOSIS — L409 Psoriasis, unspecified: Secondary | ICD-10-CM

## 2022-03-04 MED ORDER — TRIAMCINOLONE ACETONIDE 0.1 % EX OINT: TOPICAL_OINTMENT | CUTANEOUS | 2 refills | Status: DC

## 2022-03-08 ENCOUNTER — Ambulatory Visit: Payer: Medicare HMO | Admitting: Family Medicine

## 2022-03-15 ENCOUNTER — Ambulatory Visit (INDEPENDENT_AMBULATORY_CARE_PROVIDER_SITE_OTHER): Payer: Medicare HMO | Admitting: Family Medicine

## 2022-03-15 ENCOUNTER — Encounter: Payer: Self-pay | Admitting: Family Medicine

## 2022-03-15 VITALS — BP 130/64 | HR 63 | Temp 98.1°F | Ht 67.0 in | Wt 201.8 lb

## 2022-03-15 DIAGNOSIS — J432 Centrilobular emphysema: Secondary | ICD-10-CM

## 2022-03-15 DIAGNOSIS — E782 Mixed hyperlipidemia: Secondary | ICD-10-CM

## 2022-03-15 DIAGNOSIS — I10 Essential (primary) hypertension: Secondary | ICD-10-CM

## 2022-03-15 DIAGNOSIS — Z125 Encounter for screening for malignant neoplasm of prostate: Secondary | ICD-10-CM | POA: Diagnosis not present

## 2022-03-15 DIAGNOSIS — L409 Psoriasis, unspecified: Secondary | ICD-10-CM | POA: Diagnosis not present

## 2022-03-15 NOTE — Progress Notes (Signed)
Subjective:  Patient ID: Joseph Shields., male    DOB: January 12, 1946  Age: 76 y.o. MRN: [id number]  CC: Medical Management of Chronic Issues   HPI Gertrude Tarbet. presents for  follow-up of hypertension. Patient has no history of headache chest pain or shortness of breath or recent cough. Patient also denies symptoms of TIA such as focal numbness or weakness. Patient denies side effects from medication. States taking it regularly.  Some DOE - mentions vacuuming. Can mow, walk and be okay.  Taking methotrexate with good results for psoriasis. Doesnn't want to try MCA, etc.    History Nykeem has a past medical history of CAD (coronary artery disease), COPD (chronic obstructive pulmonary disease) ([place]), History of tobacco abuse, Hyperlipidemia, Morbid obesity ([place]), and Psoriasis.   He has a past surgical history that includes Spine surgery; left heart catheterization with coronary angiogram (N/A, 05/06/2014); and Cataract extraction, bilateral.   His family history includes Allergies in his mother; Asthma in his mother; Cancer in his mother; Healthy in his daughter and daughter; Heart disease (age of onset: 74) in his father; Stroke (age of onset: 23) in his mother.He reports that he quit smoking about 7 years ago. His smoking use included cigarettes. He has a 24.50 pack-year smoking history. He has never used smokeless tobacco. He reports that he does not drink alcohol and does not use drugs.  Current Outpatient Medications on File Prior to Visit  Medication Sig Dispense Refill   albuterol (VENTOLIN HFA) 108 (90 Base) MCG/ACT inhaler INHALE 2 PUFFS BY MOUTH EVERY 6 HOURS AS NEEDED FOR WHEEZING FOR SHORTNESS OF BREATH 18 g 0   aspirin EC 81 MG tablet Take 81 mg by mouth daily.     folic acid (FOLVITE) 1 MG tablet Take 1 tablet (1 mg total) by mouth daily. 90 tablet 3   methotrexate 2.5 MG tablet TAKE 4 TABLETS ONE TIME WEEKLY (CAUTION: CHEMOTHERAPY. PROTECT FROM LIGHT) 48 tablet 2    metoprolol succinate (TOPROL-XL) 25 MG 24 hr tablet TAKE 1 TABLET EVERY DAY 90 tablet 1   pyridOXINE (B-6) 50 MG tablet Take 1 tablet (50 mg total) by mouth daily. 90 tablet 3   rosuvastatin (CRESTOR) 20 MG tablet TAKE 1 TABLET EVERY DAY 90 tablet 3   Tiotropium Bromide Monohydrate (SPIRIVA RESPIMAT) 2.5 MCG/ACT AERS Inhale 2 puffs into the lungs daily. 3 each 3   triamcinolone ointment (KENALOG) 0.1 % APPLY ONE APPLICATION TOPICALLY TWO TIMES A DAY 454 g 2   No current facility-administered medications on file prior to visit.    ROS Review of Systems  Constitutional:  Negative for fever.  Respiratory:  Negative for shortness of breath.   Cardiovascular:  Negative for chest pain.  Musculoskeletal:  Negative for arthralgias.  Skin:  Negative for rash.    Objective:  BP 130/64   Pulse 63   Temp 98.1 F (36.7 C)   Ht _0  (1.702 m)   Wt 201 lb 12.8 oz (91.5 kg)   SpO2 93%   BMI 31.61 kg/m   BP Readings from Last 3 Encounters:  03/15/22 130/64  09/07/21 99/62  09/01/21 120/64    Wt Readings from Last 3 Encounters:  03/15/22 201 lb 12.8 oz (91.5 kg)  09/07/21 202 lb 3.2 oz (91.7 kg)  09/01/21 202 lb 6.4 oz (91.8 kg)     Physical Exam Vitals reviewed.  Constitutional:      Appearance: He is well-developed.  HENT:     Head:  Normocephalic and atraumatic.     Right Ear: External ear normal.     Left Ear: External ear normal.     Mouth/Throat:     Pharynx: No oropharyngeal exudate or posterior oropharyngeal erythema.  Eyes:     Pupils: Pupils are equal, round, and reactive to light.  Cardiovascular:     Rate and Rhythm: Normal rate and regular rhythm.     Heart sounds: No murmur heard. Pulmonary:     Effort: No respiratory distress.     Comments: Diminished breath sounds  Musculoskeletal:     Cervical back: Normal range of motion and neck supple.  Neurological:     Mental Status: He is alert and oriented to person, place, and time.       Assessment & Plan:    Raynor was seen today for medical management of chronic issues.  Diagnoses and all orders for this visit:  Centrilobular emphysema ([place]) -     CBC with Differential/Platelet -     CMP14+EGFR -     Lipid panel  Mixed hyperlipidemia -     CBC with Differential/Platelet -     CMP14+EGFR -     Lipid panel  Essential hypertension -     CBC with Differential/Platelet -     CMP14+EGFR -     Lipid panel  Screening for prostate cancer -     PSA, total and free  Psoriasis   Allergies as of 03/15/2022       Reactions   Flonase [fluticasone Propionate] Other (See Comments)   Nose bleeds.   Penicillins Other (See Comments)   REACTION: unknown        Medication List        Accurate as of March 15, 2022 11:30 AM. If you have any questions, ask your nurse or doctor.          STOP taking these medications    benzonatate 200 MG capsule Commonly known as: TESSALON Stopped by: [name], MD   doxycycline 100 MG tablet Commonly known as: VIBRA-TABS Stopped by: [name], MD       TAKE these medications    albuterol 108 (90 Base) MCG/ACT inhaler Commonly known as: VENTOLIN HFA INHALE 2 PUFFS BY MOUTH EVERY 6 HOURS AS NEEDED FOR WHEEZING FOR SHORTNESS OF BREATH   aspirin EC 81 MG tablet Take 81 mg by mouth daily.   folic acid 1 MG tablet Commonly known as: FOLVITE Take 1 tablet (1 mg total) by mouth daily.   methotrexate 2.5 MG tablet Commonly known as: RHEUMATREX TAKE 4 TABLETS ONE TIME WEEKLY (CAUTION: CHEMOTHERAPY. PROTECT FROM LIGHT)   metoprolol succinate 25 MG 24 hr tablet Commonly known as: TOPROL-XL TAKE 1 TABLET EVERY DAY   pyridOXINE 50 MG tablet Commonly known as: B-6 Take 1 tablet (50 mg total) by mouth daily.   rosuvastatin 20 MG tablet Commonly known as: CRESTOR TAKE 1 TABLET EVERY DAY   Spiriva Respimat 2.5 MCG/ACT Aers Generic drug: Tiotropium Bromide Monohydrate Inhale 2 puffs into the lungs daily.   triamcinolone  ointment 0.1 % Commonly known as: KENALOG APPLY ONE APPLICATION TOPICALLY TWO TIMES A DAY          No changes today in treatment regimen  Follow-up: Return in about 6 months (around 09/13/2022).  [name], M.D.

## 2022-03-16 LAB — CMP14+EGFR
ALT: 27 IU/L (ref 0–44)
AST: 28 IU/L (ref 0–40)
Albumin/Globulin Ratio: 2 (ref 1.2–2.2)
Albumin: 4.5 g/dL (ref 3.8–4.8)
Alkaline Phosphatase: 57 IU/L (ref 44–121)
BUN/Creatinine Ratio: 16 (ref 10–24)
BUN: 15 mg/dL (ref 8–27)
Bilirubin Total: 0.8 mg/dL (ref 0.0–1.2)
CO2: 23 mmol/L (ref 20–29)
Calcium: 9.3 mg/dL (ref 8.6–10.2)
Chloride: 102 mmol/L (ref 96–106)
Creatinine, Ser: 0.94 mg/dL (ref 0.76–1.27)
Globulin, Total: 2.3 g/dL (ref 1.5–4.5)
Glucose: 96 mg/dL (ref 70–99)
Potassium: 4.7 mmol/L (ref 3.5–5.2)
Sodium: 139 mmol/L (ref 134–144)
Total Protein: 6.8 g/dL (ref 6.0–8.5)
eGFR: 85 mL/min/{1.73_m2} (ref >59)

## 2022-03-16 LAB — CBC WITH DIFFERENTIAL/PLATELET
Basophils Absolute: 0 10*3/uL (ref 0.0–0.2)
Basos: 0 %
EOS (ABSOLUTE): 0.3 10*3/uL (ref 0.0–0.4)
Eos: 5 %
Hematocrit: 44 % (ref 37.5–51.0)
Hemoglobin: 14.1 g/dL (ref 13.0–17.7)
Immature Grans (Abs): 0 10*3/uL (ref 0.0–0.1)
Immature Granulocytes: 0 %
Lymphocytes Absolute: 1.6 10*3/uL (ref 0.7–3.1)
Lymphs: 23 %
MCH: 31.5 pg (ref 26.6–33.0)
MCHC: 32 g/dL (ref 31.5–35.7)
MCV: 98 fL — ABNORMAL HIGH (ref 79–97)
Monocytes Absolute: 0.7 10*3/uL (ref 0.1–0.9)
Monocytes: 11 %
Neutrophils Absolute: 4.1 10*3/uL (ref 1.4–7.0)
Neutrophils: 61 %
Platelets: 161 10*3/uL (ref 150–450)
RBC: 4.48 x10E6/uL (ref 4.14–5.80)
RDW: 13.3 % (ref 11.6–15.4)
WBC: 6.8 10*3/uL (ref 3.4–10.8)

## 2022-03-16 LAB — LIPID PANEL
Chol/HDL Ratio: 2.5 ratio (ref 0.0–5.0)
Cholesterol, Total: 124 mg/dL (ref 100–199)
HDL: 49 mg/dL (ref >39)
LDL Chol Calc (NIH): 59 mg/dL (ref 0–99)
Triglycerides: 82 mg/dL (ref 0–149)
VLDL Cholesterol Cal: 16 mg/dL (ref 5–40)

## 2022-03-16 LAB — PSA, TOTAL AND FREE
PSA, Free Pct: 57.5 %
PSA, Free: 0.23 ng/mL
Prostate Specific Ag, Serum: 0.4 ng/mL (ref 0.0–4.0)

## 2022-03-22 NOTE — Progress Notes (Signed)
Hello Bird,  Your lab result is normal and/or stable.Some minor variations that are not significant are commonly marked abnormal, but do not represent any medical problem for you.  Best regards, Blaire Palomino, M.D.

## 2022-04-05 ENCOUNTER — Ambulatory Visit (INDEPENDENT_AMBULATORY_CARE_PROVIDER_SITE_OTHER): Payer: Medicare HMO | Admitting: Family Medicine

## 2022-04-05 ENCOUNTER — Encounter: Payer: Self-pay | Admitting: Family Medicine

## 2022-04-05 VITALS — BP 112/62 | HR 60 | Temp 98.2°F | Ht 67.0 in | Wt 202.0 lb

## 2022-04-05 DIAGNOSIS — J441 Chronic obstructive pulmonary disease with (acute) exacerbation: Secondary | ICD-10-CM

## 2022-04-05 MED ORDER — PREDNISONE 20 MG PO TABS: ORAL_TABLET | ORAL | 0 refills | Status: DC

## 2022-04-05 MED ORDER — DOXYCYCLINE HYCLATE 100 MG PO TABS: 100.0000 mg | ORAL_TABLET | Freq: Two times a day (BID) | ORAL | 0 refills | Status: DC

## 2022-04-05 NOTE — Progress Notes (Signed)
BP 112/62   Pulse 60   Temp 98.2 F (36.8 C)   Ht '5\' 7"'$  (1.702 m)   Wt 202 lb (91.6 kg)   SpO2 93%   BMI 31.64 kg/m    Subjective:   Patient ID: Joseph Norman., male    DOB: 06/19/1945, 76 y.o.   MRN: [id number]  HPI: Joseph Nace. is a 76 y.o. male presenting on 04/05/2022 for URI (Cough, congestion, SOB)   HPI Cough congestion and shortness of breath Patient is coming in with cough and congestion and shortness of breath that has been going on over the past few weeks.  He did have a televisit where they sent him some doxycycline and he said it did help but it did not clear completely.  He is using his Spiriva and albuterol inhaler.  He does say he has some wheezing because of it as well.  He just feels like he just cannot get over it.  He is still having a very deep rattly cough that is productive of yellow-green sputum.  He is on methotrexate which makes him immunosuppressed.  Relevant past medical, surgical, family and social history reviewed and updated as indicated. Interim medical history since our last visit reviewed. Allergies and medications reviewed and updated.  Review of Systems  Constitutional:  Negative for chills and fever.  Eyes:  Negative for visual disturbance.  Respiratory:  Negative for shortness of breath and wheezing.   Cardiovascular:  Negative for chest pain and leg swelling.  Musculoskeletal:  Negative for back pain and gait problem.  Skin:  Negative for rash.  Neurological:  Negative for dizziness, weakness and light-headedness.  All other systems reviewed and are negative.   Per HPI unless specifically indicated above   Allergies as of 04/05/2022       Reactions   Flonase [fluticasone Propionate] Other (See Comments)   Nose bleeds.   Penicillins Other (See Comments)   REACTION: unknown        Medication List        Accurate as of April 05, 2022  2:54 PM. If you have any questions, ask your nurse or doctor.           albuterol 108 (90 Base) MCG/ACT inhaler Commonly known as: VENTOLIN HFA INHALE 2 PUFFS BY MOUTH EVERY 6 HOURS AS NEEDED FOR WHEEZING FOR SHORTNESS OF BREATH   aspirin EC 81 MG tablet Take 81 mg by mouth daily.   doxycycline 100 MG tablet Commonly known as: VIBRA-TABS Take 1 tablet (100 mg total) by mouth 2 (two) times daily. 1 po bid Started by: Fransisca Kaufmann Treshaun Carrico, MD   folic acid 1 MG tablet Commonly known as: FOLVITE Take 1 tablet (1 mg total) by mouth daily.   methotrexate 2.5 MG tablet Commonly known as: RHEUMATREX TAKE 4 TABLETS ONE TIME WEEKLY (CAUTION: CHEMOTHERAPY. PROTECT FROM LIGHT)   metoprolol succinate 25 MG 24 hr tablet Commonly known as: TOPROL-XL TAKE 1 TABLET EVERY DAY   predniSONE 20 MG tablet Commonly known as: DELTASONE 2 po at same time daily for 5 days Started by: Worthy Rancher, MD   pyridOXINE 50 MG tablet Commonly known as: B-6 Take 1 tablet (50 mg total) by mouth daily.   rosuvastatin 20 MG tablet Commonly known as: CRESTOR TAKE 1 TABLET EVERY DAY   Spiriva Respimat 2.5 MCG/ACT Aers Generic drug: Tiotropium Bromide Monohydrate Inhale 2 puffs into the lungs daily.   triamcinolone ointment 0.1 % Commonly known as: KENALOG  APPLY ONE APPLICATION TOPICALLY TWO TIMES A DAY         Objective:   BP 112/62   Pulse 60   Temp 98.2 F (36.8 C)   Ht '5\' 7"'$  (1.702 m)   Wt 202 lb (91.6 kg)   SpO2 93%   BMI 31.64 kg/m   Wt Readings from Last 3 Encounters:  04/05/22 202 lb (91.6 kg)  03/15/22 201 lb 12.8 oz (91.5 kg)  09/07/21 202 lb 3.2 oz (91.7 kg)    Physical Exam Vitals and nursing note reviewed.  Constitutional:      General: He is not in acute distress.    Appearance: He is well-developed. He is not diaphoretic.  Eyes:     General: No scleral icterus.    Conjunctiva/sclera: Conjunctivae normal.  Neck:     Thyroid: No thyromegaly.  Cardiovascular:     Rate and Rhythm: Normal rate and regular rhythm.     Heart sounds:  Normal heart sounds. No murmur heard. Pulmonary:     Effort: Pulmonary effort is normal. No respiratory distress.     Breath sounds: Normal breath sounds. No stridor. No wheezing, rhonchi or rales.  Musculoskeletal:        General: No swelling.     Cervical back: Neck supple.  Lymphadenopathy:     Cervical: No cervical adenopathy.  Skin:    General: Skin is warm and dry.     Findings: No rash.  Neurological:     Mental Status: He is alert and oriented to person, place, and time.     Coordination: Coordination normal.  Psychiatric:        Behavior: Behavior normal.       Assessment & Plan:   Problem List Items Addressed This Visit       Respiratory   COPD with acute exacerbation ([place]) - Primary   Relevant Medications   doxycycline (VIBRA-TABS) 100 MG tablet   predniSONE (DELTASONE) 20 MG tablet    Will treat like COPD exacerbation, likely still residual and not fully recovered. Follow up plan: Return if symptoms worsen or fail to improve.  Counseling provided for all of the vaccine components No orders of the defined types were placed in this encounter.   Caryl Pina, MD [facility] 04/05/2022, 2:54 PM

## 2022-04-25 DIAGNOSIS — Z1152 Encounter for screening for COVID-19: Secondary | ICD-10-CM | POA: Diagnosis not present

## 2022-04-25 DIAGNOSIS — R0989 Other specified symptoms and signs involving the circulatory and respiratory systems: Secondary | ICD-10-CM | POA: Diagnosis not present

## 2022-04-25 DIAGNOSIS — Z20828 Contact with and (suspected) exposure to other viral communicable diseases: Secondary | ICD-10-CM | POA: Diagnosis not present

## 2022-04-25 DIAGNOSIS — R0602 Shortness of breath: Secondary | ICD-10-CM | POA: Diagnosis not present

## 2022-04-25 DIAGNOSIS — R0902 Hypoxemia: Secondary | ICD-10-CM | POA: Diagnosis not present

## 2022-04-25 DIAGNOSIS — Z87891 Personal history of nicotine dependence: Secondary | ICD-10-CM | POA: Diagnosis not present

## 2022-04-25 DIAGNOSIS — I10 Essential (primary) hypertension: Secondary | ICD-10-CM | POA: Diagnosis not present

## 2022-04-25 DIAGNOSIS — R059 Cough, unspecified: Secondary | ICD-10-CM | POA: Diagnosis not present

## 2022-04-25 DIAGNOSIS — J441 Chronic obstructive pulmonary disease with (acute) exacerbation: Secondary | ICD-10-CM | POA: Diagnosis not present

## 2022-04-25 DIAGNOSIS — E785 Hyperlipidemia, unspecified: Secondary | ICD-10-CM | POA: Diagnosis not present

## 2022-04-28 ENCOUNTER — Other Ambulatory Visit: Payer: Self-pay | Admitting: Family Medicine

## 2022-05-03 ENCOUNTER — Encounter: Payer: Self-pay | Admitting: Family Medicine

## 2022-05-03 ENCOUNTER — Ambulatory Visit (INDEPENDENT_AMBULATORY_CARE_PROVIDER_SITE_OTHER): Payer: Medicare HMO | Admitting: Family Medicine

## 2022-05-03 VITALS — BP 120/64 | HR 79 | Temp 98.1°F | Ht 67.0 in | Wt 199.2 lb

## 2022-05-03 DIAGNOSIS — R0609 Other forms of dyspnea: Secondary | ICD-10-CM | POA: Diagnosis not present

## 2022-05-03 DIAGNOSIS — I071 Rheumatic tricuspid insufficiency: Secondary | ICD-10-CM | POA: Diagnosis not present

## 2022-05-03 DIAGNOSIS — I5031 Acute diastolic (congestive) heart failure: Secondary | ICD-10-CM | POA: Diagnosis not present

## 2022-05-03 DIAGNOSIS — J439 Emphysema, unspecified: Secondary | ICD-10-CM | POA: Diagnosis not present

## 2022-05-03 DIAGNOSIS — R5381 Other malaise: Secondary | ICD-10-CM | POA: Diagnosis not present

## 2022-05-03 DIAGNOSIS — J449 Chronic obstructive pulmonary disease, unspecified: Secondary | ICD-10-CM | POA: Diagnosis not present

## 2022-05-03 DIAGNOSIS — I219 Acute myocardial infarction, unspecified: Secondary | ICD-10-CM | POA: Diagnosis not present

## 2022-05-03 DIAGNOSIS — I2510 Atherosclerotic heart disease of native coronary artery without angina pectoris: Secondary | ICD-10-CM | POA: Diagnosis not present

## 2022-05-03 DIAGNOSIS — Z20822 Contact with and (suspected) exposure to covid-19: Secondary | ICD-10-CM | POA: Diagnosis not present

## 2022-05-03 DIAGNOSIS — I252 Old myocardial infarction: Secondary | ICD-10-CM | POA: Diagnosis not present

## 2022-05-03 DIAGNOSIS — I5033 Acute on chronic diastolic (congestive) heart failure: Secondary | ICD-10-CM | POA: Diagnosis not present

## 2022-05-03 DIAGNOSIS — R69 Illness, unspecified: Secondary | ICD-10-CM | POA: Diagnosis not present

## 2022-05-03 DIAGNOSIS — I10 Essential (primary) hypertension: Secondary | ICD-10-CM | POA: Diagnosis not present

## 2022-05-03 DIAGNOSIS — R0902 Hypoxemia: Secondary | ICD-10-CM | POA: Diagnosis not present

## 2022-05-03 DIAGNOSIS — J441 Chronic obstructive pulmonary disease with (acute) exacerbation: Secondary | ICD-10-CM | POA: Diagnosis not present

## 2022-05-03 DIAGNOSIS — R0600 Dyspnea, unspecified: Secondary | ICD-10-CM | POA: Diagnosis not present

## 2022-05-03 DIAGNOSIS — E785 Hyperlipidemia, unspecified: Secondary | ICD-10-CM | POA: Diagnosis not present

## 2022-05-03 DIAGNOSIS — E782 Mixed hyperlipidemia: Secondary | ICD-10-CM | POA: Diagnosis not present

## 2022-05-03 DIAGNOSIS — I110 Hypertensive heart disease with heart failure: Secondary | ICD-10-CM | POA: Diagnosis not present

## 2022-05-03 DIAGNOSIS — Z87891 Personal history of nicotine dependence: Secondary | ICD-10-CM | POA: Diagnosis not present

## 2022-05-03 DIAGNOSIS — I5030 Unspecified diastolic (congestive) heart failure: Secondary | ICD-10-CM | POA: Diagnosis not present

## 2022-05-03 DIAGNOSIS — I5020 Unspecified systolic (congestive) heart failure: Secondary | ICD-10-CM | POA: Diagnosis not present

## 2022-05-03 DIAGNOSIS — I272 Pulmonary hypertension, unspecified: Secondary | ICD-10-CM | POA: Diagnosis not present

## 2022-05-03 DIAGNOSIS — Z79899 Other long term (current) drug therapy: Secondary | ICD-10-CM | POA: Diagnosis not present

## 2022-05-03 DIAGNOSIS — Z9981 Dependence on supplemental oxygen: Secondary | ICD-10-CM | POA: Diagnosis not present

## 2022-05-03 NOTE — Progress Notes (Signed)
Acute Office Visit  Subjective:     Patient ID: Joseph Shields., male    DOB: 09/18/1945, 77 y.o.   MRN: [id number]  Chief Complaint  Patient presents with   Shortness of Breath    HPI Patient is in today for worsening shortness of breath since yesterday. He has been treated for a COPD exacerbation over the last 6 weeks. He has been seen at [facility], here, and at the ER in [place]. During this time, he has been on doxycyline for 2 rounds, prednisone, and azithromycin. He was last evaluated in the ER at [place] 8 days ago. At that time, his CXR was negative for pneumonia nd he was sent home with azithromycin. He did feel like his symptoms improved some while on azithromycin. He completed this yesterday however. He reports his shortness of breath really increased yesterday, especially last night. He had to sleep elevated last night so that he could breathe. He reports that his cough is productive of sputum. He denies fever or chest pain. He has been feeling lightheaded and dizzy. He does not wear oxygen at home. He uses spiriva daily and albuterol prn. He has been using cough syrup without improvement. He was hypoxic on arrival today with oxygen saturations at 77% on room air.   ROS As per HPI.      Objective:    BP 120/64   Pulse 79   Temp 98.1 F (36.7 C) (Temporal)   Ht '5\' 7"'$  (1.702 m)   Wt 199 lb 4 oz (90.4 kg)   SpO2 92% Comment: on 2 L via [place]  BMI 31.21 kg/m    Physical Exam Vitals and nursing note reviewed.  Constitutional:      General: He is not in acute distress.    Appearance: He is not ill-appearing, toxic-appearing or diaphoretic.  Cardiovascular:     Rate and Rhythm: Normal rate and regular rhythm.  Pulmonary:     Effort: Tachypnea present.     Breath sounds: Examination of the right-upper field reveals decreased breath sounds and rhonchi. Examination of the left-upper field reveals decreased breath sounds and rhonchi. Examination of the right-middle field reveals  decreased breath sounds and rhonchi. Examination of the left-middle field reveals decreased breath sounds and rhonchi. Examination of the right-lower field reveals decreased breath sounds and rhonchi. Examination of the left-lower field reveals decreased breath sounds and rhonchi. Decreased breath sounds and rhonchi present. No wheezing or rales.  Chest:     Chest wall: No tenderness or edema.  Abdominal:     Palpations: Abdomen is soft.     Tenderness: There is no abdominal tenderness. There is no guarding.  Musculoskeletal:     Right lower leg: No edema.     Left lower leg: No edema.  Skin:    General: Skin is warm and dry.  Neurological:     Mental Status: He is alert and oriented to person, place, and time.  Psychiatric:        Mood and Affect: Mood normal.        Behavior: Behavior normal.     No results found for any visits on 05/03/22.      Assessment & Plan:   Dale was seen today for shortness of breath.  Diagnoses and all orders for this visit:  Hypoxia COPD with acute exacerbation ([place]) Arrived hypoxic with oxygen saturation of 77% on room air. He was placed on [place] 2L and oxygen saturation stabilized at 92-93%. He has been  treated with doxycyline x 2, azithromycin, and prednisone without improvement over the last few week. Discussed need for ER evaluation for further work up and evaluation. Discussed need for EMS transport due to oxygen requirement. He agrees with plan. EMS was notified and patient was transferred to their care at their arrival.   Gwenlyn Perking, FNP

## 2022-05-06 ENCOUNTER — Telehealth: Payer: Self-pay | Admitting: *Deleted

## 2022-05-06 ENCOUNTER — Telehealth: Payer: Self-pay

## 2022-05-06 NOTE — Progress Notes (Signed)
  Care Coordination  Note  05/06/2022 Name: Joseph Shields. MRN: [id number] DOB: 04-Jun-1945  Joseph Diones Morici Brooke Shields. is a 77 y.o. year old primary care patient of Stacks, Cletus Gash, MD. I reached out to [employer]. by phone today to assist with scheduling a follow up appointment. Joseph Diones Barajas Jr. verbally consented to my assistance.       Follow up plan: Hospital Follow Up appointment scheduled with Livia Snellen, Cletus Gash, MD) on (05/11/22) at (1:10 PM).  [place]  Direct Dial: [phone]

## 2022-05-06 NOTE — Patient Outreach (Signed)
  Care Coordination [facility] Note Transition Care Management Follow-up Telephone Call Date of discharge and from where: 05/05/22 [facility] COPD excerbation How have you been since you were released from the hospital? I am doing OK as long as I wear my oxygen Any questions or concerns? No  Items Reviewed: Did the pt receive and understand the discharge instructions provided? Yes  Medications obtained and verified? Yes  Other? Yes  s/sx to call MD Any new allergies since your discharge? No  Dietary orders reviewed? Yes low sodium Do you have support at home? Yes   Home Care and Equipment/Supplies: Were home health services ordered? no If so, what is the name of the agency? N/a  Has the agency set up a time to come to the patient's home? not applicable Were any new equipment or medical supplies ordered?  Yes: oxygen What is the name of the medical supply agency? [employer] Were you able to get the supplies/equipment? yes Do you have any questions related to the use of the equipment or supplies? No  Functional Questionnaire: (I = Independent and D = Dependent) ADLs: I  Bathing/Dressing- I  Meal Prep- I  Eating- I  Maintaining continence- I  Transferring/Ambulation- I  Managing Meds- I  Follow up appointments reviewed:  PCP Hospital f/u appt confirmed? No  Scheduled to see  on  @ . Referral to care guide to schedule  Specialist Hospital f/u appt confirmed? Yes  Scheduled to see Cardiology Nicholes Rough PA on 05/17/22 @ 1:30 PM. Are transportation arrangements needed? No  If their condition worsens, is the pt aware to call PCP or go to the Emergency Dept.? Yes Was the patient provided with contact information for the PCP's office or ED? Yes Was to pt encouraged to call back with questions or concerns? Yes  SDOH assessments and interventions completed:   Yes SDOH Interventions Today    Flowsheet Row Most Recent Value  SDOH Interventions   Food Insecurity Interventions  Intervention Not Indicated  Transportation Interventions Intervention Not Indicated       Care Coordination Interventions:  PCP follow up appointment requested   Encounter Outcome:  Pt. Visit Completed   Peter Garter RN, BSN,CCM, CDE Care Management Coordinator [facility] [phone]

## 2022-05-11 ENCOUNTER — Encounter: Payer: Self-pay | Admitting: Family Medicine

## 2022-05-11 ENCOUNTER — Ambulatory Visit (INDEPENDENT_AMBULATORY_CARE_PROVIDER_SITE_OTHER): Payer: Medicare HMO | Admitting: Family Medicine

## 2022-05-11 ENCOUNTER — Ambulatory Visit (INDEPENDENT_AMBULATORY_CARE_PROVIDER_SITE_OTHER): Payer: Medicare HMO

## 2022-05-11 VITALS — BP 132/71 | HR 67 | Temp 97.4°F | Ht 67.0 in | Wt 192.4 lb

## 2022-05-11 DIAGNOSIS — J441 Chronic obstructive pulmonary disease with (acute) exacerbation: Secondary | ICD-10-CM | POA: Diagnosis not present

## 2022-05-11 DIAGNOSIS — J449 Chronic obstructive pulmonary disease, unspecified: Secondary | ICD-10-CM

## 2022-05-11 DIAGNOSIS — R059 Cough, unspecified: Secondary | ICD-10-CM | POA: Diagnosis not present

## 2022-05-11 DIAGNOSIS — R0902 Hypoxemia: Secondary | ICD-10-CM

## 2022-05-11 DIAGNOSIS — R0600 Dyspnea, unspecified: Secondary | ICD-10-CM | POA: Diagnosis not present

## 2022-05-11 DIAGNOSIS — J432 Centrilobular emphysema: Secondary | ICD-10-CM | POA: Diagnosis not present

## 2022-05-11 NOTE — Progress Notes (Addendum)
Subjective:  Patient ID: Joseph Norman., male    DOB: 1945-10-23  Age: 77 y.o. MRN: [id number]  CC: Hospitalization Follow-up   HPI Joseph Matlock. presents for follow up from hospitalization at [facility] from 05/03/22 to 05/06/22. He has to use O2 at home. Uses a long lead. However, his wife is wheelchair bound and as they move around the house, the cord is in danger of eing run over by her wheelchair, causing it to tear and not deliver the O2 he requires. He has had exacerbation of his COPD ongoing for several weeks. Also some CHF with preserved ejection fraction. Had ech showing LVEF >70. He had improvement with diuresis.   O2 in office today:   Sitting, room air, 89% Sitting, 3L O2, 91% Walking, room air, 85% Walking, 3L O2, 88%     04/05/2022    2:38 PM 03/15/2022   10:47 AM 09/01/2021   10:11 AM  Depression screen PHQ 2/9  Decreased Interest 0 0 3  Down, Depressed, Hopeless 0 0 0  PHQ - 2 Score 0 0 3  Altered sleeping 0  0  Tired, decreased energy 0  0  Change in appetite 0  0  Feeling bad or failure about yourself  0  0  Trouble concentrating 0  0  Moving slowly or fidgety/restless 0  0  Suicidal thoughts 0  0  PHQ-9 Score 0  3  Difficult doing work/chores Not difficult at all  Not difficult at all    History Joseph Shields has a past medical history of CAD (coronary artery disease), COPD (chronic obstructive pulmonary disease) ([place]), History of tobacco abuse, Hyperlipidemia, Morbid obesity ([place]), and Psoriasis.   He has a past surgical history that includes Spine surgery; left heart catheterization with coronary angiogram (N/A, 05/06/2014); and Cataract extraction, bilateral.   His family history includes Allergies in his mother; Asthma in his mother; Cancer in his mother; Healthy in his daughter and daughter; Heart disease (age of onset: 69) in his father; Stroke (age of onset: 32) in his mother.He reports that he quit smoking about 8 years ago. His smoking use included  cigarettes. He has a 24.50 pack-year smoking history. He has never used smokeless tobacco. He reports that he does not drink alcohol and does not use drugs.    ROS Review of Systems  Constitutional:  Negative for fever.  Respiratory:  Positive for shortness of breath (with exertion . Cannot ambulate with less than 3L O2 by Joseph Shields).   Cardiovascular:  Negative for chest pain.  Musculoskeletal:  Negative for arthralgias.  Skin:  Negative for rash.    Objective:  BP 132/71   Pulse 67   Temp (!) 97.4 F (36.3 C)   Ht '5\' 7"'$  (1.702 m)   Wt 192 lb 6.4 oz (87.3 kg)   SpO2 91% Comment: ON 3l O2  BMI 30.13 kg/m   BP Readings from Last 3 Encounters:  05/18/22 134/76  05/17/22 123/73  05/11/22 132/71    Wt Readings from Last 3 Encounters:  05/18/22 190 lb 3.2 oz (86.3 kg)  05/17/22 192 lb 3.2 oz (87.2 kg)  05/11/22 192 lb 6.4 oz (87.3 kg)     Physical Exam Vitals reviewed.  Constitutional:      Appearance: He is well-developed.  HENT:     Head: Normocephalic and atraumatic.     Right Ear: External ear normal.     Left Ear: External ear normal.     Mouth/Throat:  Pharynx: No oropharyngeal exudate or posterior oropharyngeal erythema.  Eyes:     Pupils: Pupils are equal, round, and reactive to light.  Cardiovascular:     Rate and Rhythm: Normal rate and regular rhythm.     Heart sounds: No murmur heard. Pulmonary:     Effort: No respiratory distress.     Breath sounds: Wheezing and rhonchi present. No rales.  Chest:     Chest wall: No tenderness.  Musculoskeletal:     Cervical back: Normal range of motion and neck supple.  Neurological:     Mental Status: He is alert and oriented to person, place, and time.       Assessment & Plan:   Joseph Shields was seen today for hospitalization follow-up.  Diagnoses and all orders for this visit:  COPD with acute exacerbation ([place]) -     DG Chest 2 View; Future -     Brain natriuretic peptide -     CMP14+EGFR -     Cancel:  For home use only DME oxygen -     For home use only DME oxygen  Hypoxia -     Cancel: For home use only DME oxygen -     For home use only DME oxygen  Centrilobular emphysema ([place]) -     Cancel: For home use only DME oxygen -     For home use only DME oxygen  End stage COPD ([place]) -     Cancel: For home use only DME oxygen -     For home use only DME oxygen       I am having Joseph Shields. "W.L." maintain his aspirin EC, folic acid, metoprolol succinate, rosuvastatin, triamcinolone ointment, and albuterol.  Allergies as of 05/11/2022       Reactions   Flonase [fluticasone Propionate] Other (See Comments)   Nose bleeds.   Penicillins Other (See Comments)   REACTION: unknown        Medication List        Accurate as of May 11, 2022  2:25 PM. If you have any questions, ask your nurse or doctor.          albuterol 108 (90 Base) MCG/ACT inhaler Commonly known as: VENTOLIN HFA INHALE 2 PUFFS BY MOUTH EVERY 6 HOURS AS NEEDED FOR WHEEZING OR SHORTNESS OF BREATH   aspirin EC 81 MG tablet Take 81 mg by mouth daily.   folic acid 1 MG tablet Commonly known as: FOLVITE Take 1 tablet (1 mg total) by mouth daily.   methotrexate 2.5 MG tablet Commonly known as: RHEUMATREX TAKE 4 TABLETS ONE TIME WEEKLY (CAUTION: CHEMOTHERAPY. PROTECT FROM LIGHT)   metoprolol succinate 25 MG 24 hr tablet Commonly known as: TOPROL-XL TAKE 1 TABLET EVERY DAY   pyridOXINE 50 MG tablet Commonly known as: B-6 Take 1 tablet (50 mg total) by mouth daily.   rosuvastatin 20 MG tablet Commonly known as: CRESTOR TAKE 1 TABLET EVERY DAY   Spiriva Respimat 2.5 MCG/ACT Aers Generic drug: Tiotropium Bromide Monohydrate Inhale 2 puffs into the lungs daily.   triamcinolone ointment 0.1 % Commonly known as: KENALOG APPLY ONE APPLICATION TOPICALLY TWO TIMES A DAY               Durable Medical Equipment  (From admission, onward)           Start     Ordered   05/11/22  0000  For home use only DME oxygen  Comments: Pt. Wife is in a wheelchair. A long lead is not satisfactory as the cord is at risk of being run over y the wheelchair, splitting and not delivering the O2 to Joseph Shields.   Supply need: Backpack/portable oxygen concentrator Dx: K58.9, R09.02  Question Answer Comment  Length of Need Lifetime   Mode or (Route) Nasal cannula   Liters per Minute 3   Frequency Continuous (stationary and portable oxygen unit needed)   Oxygen delivery system Gas      05/11/22 1419             Follow-up: Return in about 1 month (around 06/11/2022).  [name], M.D.

## 2022-05-11 NOTE — Progress Notes (Signed)
Sitting, room air, 89% Sitting, 3L O2, 91% Walking, room air, 85% Walking, 3L O2, 88%

## 2022-05-12 LAB — CMP14+EGFR
ALT: 28 IU/L (ref 0–44)
AST: 24 IU/L (ref 0–40)
Albumin/Globulin Ratio: 1.7 (ref 1.2–2.2)
Albumin: 4.2 g/dL (ref 3.8–4.8)
Alkaline Phosphatase: 47 IU/L (ref 44–121)
BUN/Creatinine Ratio: 18 (ref 10–24)
BUN: 19 mg/dL (ref 8–27)
Bilirubin Total: 0.8 mg/dL (ref 0.0–1.2)
CO2: 28 mmol/L (ref 20–29)
Calcium: 9.3 mg/dL (ref 8.6–10.2)
Chloride: 100 mmol/L (ref 96–106)
Creatinine, Ser: 1.05 mg/dL (ref 0.76–1.27)
Globulin, Total: 2.5 g/dL (ref 1.5–4.5)
Glucose: 88 mg/dL (ref 70–99)
Potassium: 4.9 mmol/L (ref 3.5–5.2)
Sodium: 139 mmol/L (ref 134–144)
Total Protein: 6.7 g/dL (ref 6.0–8.5)
eGFR: 74 mL/min/{1.73_m2} (ref >59)

## 2022-05-12 LAB — BRAIN NATRIURETIC PEPTIDE: BNP: 49.3 pg/mL (ref 0.0–100.0)

## 2022-05-12 NOTE — Progress Notes (Signed)
Hello Suhaan,  Your lab result is normal and/or stable.Some minor variations that are not significant are commonly marked abnormal, but do not represent any medical problem for you.  Best regards, [name], M.D.

## 2022-05-14 NOTE — Progress Notes (Signed)
Oxygen order faxed to [facility] at [phone]

## 2022-05-16 NOTE — Progress Notes (Signed)
Office Visit    Patient Name: Joseph Shields. Date of Encounter: 05/17/2022  PCP:  [name], [place]  Cardiologist:  Fransico Him, MD  Advanced Practice Provider:  No care team member to display Electrophysiologist:  None   HPI    Joseph Diones Amick Brooke Bonito. is a 77 y.o. 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  History of hyperlipidemia and COPD.  Started on Lipitor and stopped due to muscle aches and remains on Crestor.  Stopped Coreg because it made his legs swell and was started on metoprolol presents today for follow-up appointment.  He was last seen 05/15/2021 and was doing well at that time.  Had some chronic DOE related to COPD which she states is stable.  Denied chest pain/pressure.  Compliant with all medications.  Today, he states that he was having nosebleeds on a baby aspirin a day with patient that he is now requiring.  He is on 3 L.  He does feel shortness of breath has gotten better.  He received IV Lasix in the hospital but now is not requiring diuretic.  Echocardiogram and CTA from hospitalization reviewed.  LVEF was preserved and actually hyperdynamic greater than 70%.  He is off of his baby aspirin due to nosebleeds and is taking grapeseed extract which his wife takes as well.  He is asking about starting back on an exercise program.  He sees pulmonology tomorrow for further workup of his COPD.  He was discharged on DuoNebs which she needs about twice a day for shortness of breath.  Reports no chest pain, pressure, or tightness. No edema, orthopnea, PND. Reports no palpitations.    Past Medical History    Past Medical History:  Diagnosis Date   CAD (coronary artery disease)    a. 03/2014 NSTEMI - initially refused cath;  b. 03/2014 Echo: EF 55-60%;  c. 04/2014  Cath: LM nl, LAD 20-30p, 13m D1 30-40, D2 small, 60-70, LCX nl, RCA 100p (CTO), L->R collaterals, EF 55-60%-->Med Rx.   COPD (chronic obstructive pulmonary disease) ([facility]    History of tobacco abuse    a. 40+ pack years, quit 03/2014.   Hyperlipidemia    Morbid obesity ([place]    Psoriasis    Past Surgical History:  Procedure Laterality Date   CATARACT EXTRACTION, BILATERAL     LEFT HEART CATHETERIZATION WITH CORONARY ANGIOGRAM N/A 05/06/2014   Procedure: LEFT HEART CATHETERIZATION WITH CORONARY ANGIOGRAM;  Surgeon: DLeonie Man MD;  [facility];  Service: Cardiovascular;  Laterality: N/A;   SPINE SURGERY      Allergies  Allergies  Allergen Reactions   Flonase [Fluticasone Propionate] Other (See Comments)    Nose bleeds.   Penicillins Other (See Comments)    REACTION: unknown     EKGs/Labs/Other Studies Reviewed:   The following studies were reviewed today:   Echocardiogram W Colorflow Spectral Doppler  Result Date: 05/04/2022 Patient Info Name: Joseph WenkeWard Age: [age]years DOB: [birth year]Gender: Male MRN: [id number] #: [id number]Account #: [id number]: 173 cm Wt: 91 kg BSA: 2.12 m2 BP: 125 / 74 mmHg HR: 95 bpm Heart Rhythm: Sinus Rhythm Exam Date: 05/04/2022 10:14 AM Admit Date: 05/03/2022 Exam Type: ECHOCARDIOGRAM W COLORFLOW SPECTRAL DOPPLER Technical Quality: Fair Staff Sonographer: MMelvenia BeamSupervising Physician: SCathie HoopsMD Referring  Physician: Sunnie Nielsen Study Info Indications - chf Summary 1. The left ventricle is normal in size with normal wall thickness. 2. The left ventricular systolic function is hyperdynamic, LVEF is visually estimated at >70%. 3. The right ventricle is upper normal in size, with normal systolic function. 4. There is mild pulmonary hypertension. Left Ventricle The left ventricle is normal in size with normal wall thickness. The left ventricular systolic function is hyperdynamic, LVEF is visually  estimated at >70%. The left ventricular diastolic function is indeterminate. Right Ventricle The right ventricle is upper normal in size, with normal systolic function. Left Atrium The left atrium is normal in size. Right Atrium The right atrium is normal in size. Aortic Valve The aortic valve is not well visualized. The aortic valve is probably trileaflet with moderately thickened leaflets with mildly reduced excursion. There is no significant aortic regurgitation. There is no evidence of a significant transvalvular gradient. Mitral Valve The mitral valve leaflets are normal with normal leaflet mobility. There is no significant mitral valve regurgitation. Tricuspid Valve The tricuspid valve leaflets are normal, with normal leaflet mobility. There is trivial tricuspid regurgitation. There is mild pulmonary hypertension. TR maximum velocity: 3.2 m/s Estimated PASP: 43 mmHg. Pulmonic Valve The pulmonic valve is normal. There is no significant pulmonic regurgitation. There is no evidence of a significant transvalvular gradient. Aorta The aorta is normal in size in the visualized segments. Inferior Vena Cava IVC size and inspiratory change suggest normal right atrial pressure. (0-5 mmHg). Pericardium/Pleural There is no pericardial effusion. Other Findings Rhythm: Sinus Rhythm.    CTA Chest W Contrast  Result Date: 05/03/2022 CLINICAL DATA: Pulmonary embolism, dyspnea EXAM: CT ANGIOGRAPHY CHEST WITH CONTRAST TECHNIQUE: Multidetector CT imaging of the chest was performed using the standard protocol during bolus administration of intravenous contrast. Multiplanar CT image reconstructions and MIPs were obtained to evaluate the vascular anatomy. RADIATION DOSE REDUCTION: This exam was performed according to the departmental dose-optimization program which includes automated exposure control, adjustment of the mA and/or kV according to patient size and/or use of iterative reconstruction technique. CONTRAST: 75 cc  Omnipaque 350 COMPARISON: 04/13/2014 FINDINGS: Cardiovascular: Adequate opacification of the pulmonary arterial tree. No intraluminal filling defect identified to suggest acute pulmonary embolism. Central pulmonary arteries are of normal caliber. Moderate coronary artery calcification. Cardiac size within normal limits. No pericardial effusion. Mild atherosclerotic calcification within the thoracic aorta. No aortic aneurysm. Mediastinum/Nodes: No enlarged mediastinal, hilar, or axillary lymph nodes. Thyroid gland, trachea, and esophagus demonstrate no significant findings. Lungs/Pleura: Mild centrilobular and paraseptal emphysema there is diffuse bronchial wall thickening identified in keeping with airway inflammation. No focal pulmonary nodules or infiltrates. No pneumothorax or pleural effusion. Upper Abdomen: No acute abnormality. Musculoskeletal: No acute bone abnormality. No lytic or blastic bone lesion. Degenerative changes are seen within the thoracic spine with accentuated thoracolumbar kyphosis Review of the MIP images confirms the above findings.   1. No pulmonary embolism. 2. Moderate coronary artery calcification. 3. Mild emphysema. Diffuse bronchial wall thickening in keeping with airway inflammation. Aortic Atherosclerosis (ICD10-I70.0) and Emphysema (ICD10-J43.9). Electronically Signed By: Fidela Salisbury M.D. On: 05/03/2022 19:59   EKG:  EKG is  ordered today.  The ekg ordered today demonstrates NSR rate 68 bpm  Recent Labs: 03/15/2022: Hemoglobin 14.1; Platelets 161 05/11/2022: ALT 28; BNP 49.3; BUN 19; Creatinine, Ser 1.05; Potassium 4.9; Sodium 139  Recent Lipid Panel    Component Value Date/Time   CHOL 124 03/15/2022 1134   TRIG 82 03/15/2022 1134   HDL  49 03/15/2022 1134   CHOLHDL 2.5 03/15/2022 1134   CHOLHDL 2.6 09/29/2015 0914   VLDL 15 09/29/2015 0914   LDLCALC 59 03/15/2022 1134   LDLDIRECT 71 05/10/2016 1639    Home Medications   Current Meds  Medication Sig    albuterol (VENTOLIN HFA) 108 (90 Base) MCG/ACT inhaler INHALE 2 PUFFS BY MOUTH EVERY 6 HOURS AS NEEDED FOR WHEEZING OR SHORTNESS OF BREATH   aspirin EC 81 MG tablet Take 81 mg by mouth daily.   folic acid (FOLVITE) 1 MG tablet Take 1 tablet (1 mg total) by mouth daily.   methotrexate (RHEUMATREX) 2.5 MG tablet Take 4 tablets by mouth once a week. Caution:Chemotherapy. Protect from light. (CURRENTLY ON HOLD)   metoprolol succinate (TOPROL-XL) 25 MG 24 hr tablet TAKE 1 TABLET EVERY DAY   rosuvastatin (CRESTOR) 20 MG tablet TAKE 1 TABLET EVERY DAY   Tiotropium Bromide Monohydrate (SPIRIVA RESPIMAT) 2.5 MCG/ACT AERS Inhale 2 puffs into the lungs daily.   triamcinolone ointment (KENALOG) 0.1 % APPLY ONE APPLICATION TOPICALLY TWO TIMES A DAY     Review of Systems      All other systems reviewed and are otherwise negative except as noted above.  Physical Exam    VS:  BP 123/73   Pulse 68   Ht '5\' 7"'$  (1.702 m)   Wt 192 lb 3.2 oz (87.2 kg)   SpO2 97%   BMI 30.10 kg/m  , BMI Body mass index is 30.1 kg/m.  Wt Readings from Last 3 Encounters:  05/17/22 192 lb 3.2 oz (87.2 kg)  05/11/22 192 lb 6.4 oz (87.3 kg)  05/03/22 199 lb 4 oz (90.4 kg)     GEN: Well nourished, well developed, in no acute distress. HEENT: normal. Neck: Supple, no JVD, carotid bruits, or masses. Cardiac: RRR, no murmurs, rubs, or gallops. No clubbing, cyanosis, trace bilateral edema.  Radials/PT 2+ and equal bilaterally.  Respiratory:  Respirations regular and unlabored, clear to auscultation bilaterally. GI: Soft, nontender, nondistended. MS: No deformity or atrophy. Skin: Warm and dry, no rash. Neuro:  Strength and sensation are intact. Psych: Normal affect.  Assessment & Plan    CAD -He never had chest pain, CAD was found accidentally when he was hospitalized for pneumonia back in 2015 -continue current medications, he is currently off his aspirin due to nosebleeds from the oxygen. He is taking his wife's grape  seed extract -echo and CTA reviewed from hospitalization at [facility]  -preserved EF on echo -euvolemic on exam today and not requiring diuretic therapy.  HLD -LDL 59 -issues with Lipitor, but tolerating Crestor without issue -continue current medications -would need to repeat lipid panel in the fall  Obesity -he can start back on a light exercise program as his lungs allow  Hypertension -BP well controlled today -Continue metoprolol succinate  Disposition: Follow up 2-3 months with Fransico Him, MD or APP.  Signed, Elgie Collard, PA-C 05/17/2022, 2:21 PM [facility]

## 2022-05-17 ENCOUNTER — Ambulatory Visit: Payer: Medicare HMO | Attending: Physician Assistant | Admitting: Physician Assistant

## 2022-05-17 ENCOUNTER — Encounter: Payer: Self-pay | Admitting: Physician Assistant

## 2022-05-17 VITALS — BP 123/73 | HR 68 | Ht 67.0 in | Wt 192.2 lb

## 2022-05-17 DIAGNOSIS — E6609 Other obesity due to excess calories: Secondary | ICD-10-CM | POA: Diagnosis not present

## 2022-05-17 DIAGNOSIS — I2510 Atherosclerotic heart disease of native coronary artery without angina pectoris: Secondary | ICD-10-CM

## 2022-05-17 DIAGNOSIS — Z6830 Body mass index (BMI) 30.0-30.9, adult: Secondary | ICD-10-CM

## 2022-05-17 DIAGNOSIS — I10 Essential (primary) hypertension: Secondary | ICD-10-CM

## 2022-05-17 DIAGNOSIS — E785 Hyperlipidemia, unspecified: Secondary | ICD-10-CM | POA: Diagnosis not present

## 2022-05-17 DIAGNOSIS — I509 Heart failure, unspecified: Secondary | ICD-10-CM | POA: Diagnosis not present

## 2022-05-17 NOTE — Patient Instructions (Signed)
Medication Instructions:  Your physician recommends that you continue on your current medications as directed. Please refer to the Current Medication list given to you today.  *If you need a refill on your cardiac medications before your next appointment, please call your pharmacy*   Lab Work: None If you have labs (blood work) drawn today and your tests are completely normal, you will receive your results only by: [place] (if you have MyChart) OR A paper copy in the mail If you have any lab test that is abnormal or we need to change your treatment, we will call you to review the results.   Testing/Procedures: Your physician has requested that you have an echocardiogram. Echocardiography is a painless test that uses sound waves to create images of your heart. It provides your doctor with information about the size and shape of your heart and how well your heart's chambers and valves are working. This procedure takes approximately one hour. There are no restrictions for this procedure. Please do NOT wear cologne, perfume, aftershave, or lotions (deodorant is allowed). Please arrive 15 minutes prior to your appointment time.    Follow-Up: At [facility], you and your health needs are our priority.  As part of our continuing mission to provide you with exceptional heart care, we have created designated Provider Care Teams.  These Care Teams include your primary Cardiologist (physician) and Advanced Practice Providers (APPs -  Physician Assistants and Nurse Practitioners) who all work together to provide you with the care you need, when you need it.   Your next appointment:   2-3 month(s)  Provider:   Fransico Him, MD

## 2022-05-18 ENCOUNTER — Ambulatory Visit (INDEPENDENT_AMBULATORY_CARE_PROVIDER_SITE_OTHER): Payer: Medicare HMO | Admitting: Internal Medicine

## 2022-05-18 ENCOUNTER — Other Ambulatory Visit: Payer: Self-pay | Admitting: *Deleted

## 2022-05-18 ENCOUNTER — Encounter: Payer: Self-pay | Admitting: Internal Medicine

## 2022-05-18 VITALS — BP 134/76 | HR 73 | Temp 98.5°F | Ht 68.0 in | Wt 190.2 lb

## 2022-05-18 DIAGNOSIS — R040 Epistaxis: Secondary | ICD-10-CM

## 2022-05-18 DIAGNOSIS — J9611 Chronic respiratory failure with hypoxia: Secondary | ICD-10-CM

## 2022-05-18 DIAGNOSIS — J441 Chronic obstructive pulmonary disease with (acute) exacerbation: Secondary | ICD-10-CM | POA: Diagnosis not present

## 2022-05-18 MED ORDER — BREZTRI AEROSPHERE 160-9-4.8 MCG/ACT IN AERO: 2.0000 | INHALATION_SPRAY | Freq: Two times a day (BID) | RESPIRATORY_TRACT | 0 refills | Status: DC

## 2022-05-18 MED ORDER — BREZTRI AEROSPHERE 160-9-4.8 MCG/ACT IN AERO: 2.0000 | INHALATION_SPRAY | Freq: Two times a day (BID) | RESPIRATORY_TRACT | 11 refills | Status: DC

## 2022-05-18 NOTE — Progress Notes (Unsigned)
Subjective:   Patient ID: Joseph Shields, male    DOB: [birth year],   MRN: [id number]    Brief patient profile:  38 yowm  Quit Smoking  04/07/14 when admit with dx of pna and better since discharge to point where can now get up the hill to feed the dog but at one point couldn't make it up the hill and self referred to pulmonary 05/21/2014 for eval and proved to have GOLD III copd/ mildly reversible  Admit date: 04/13/2014 Discharge date: 04/15/2014  Discharge Diagnoses:     SIRS   Acute respiratory failure with hypoxia due to acute bronchitis/failed OP therapy   NSTEMI (non-ST elevated myocardial infarction)/refused IP cath   Psoriasis   Smoker    Cough due to acute bronchitis      History of present illness:   77 year old male patient with history of psoriasis on methotrexate. Presented with complaints of cough and fever. Had initially been evaluated by his primary care physician and was started on azithromycin and cough suppressant therapy but after 3 days of treatment symptoms were worsening with dyspnea on exertion and extreme fatigue.  Evaluation in the ER revealed a mildly hypertensive patient with tachycardia.  He was afebrile with room air sats dipping to 83-84%. He had a white count of 20,300. Chest x-ray was suggestive of bronchitis. He also had elevated troponin at 3.79, total CK of 428, and a CKMB of 29. Patient had a prolonged stay in the ER and a second troponin was elevated at 6.16. EKG was nonischemic and only demonstrated sinus tachycardia with nonspecific ST abnormality. Cardiology was consulted. A stat echo was completed in the ER which revealed normal LVEF without any obvious wall motion abnormalities. CT of the chest had also been accomplished and demonstrated coronary artery calcifications. No indications for emergent catheterization per Cardiology. Because of active wheezing at time of presentation beta blockers were not initiated.   Hospital Course:     NSTEMI Evaluated by cardiology this admission. Initially no beta blockers due to active wheezing at presentation. Heparin drip was initiated. Lipid panel revealed suboptimal HDL at 28. Statin therapy initiated. Cardiology offered cardiac catheterization to the patient but he refused. He is amenable to proceeding with outpatient cardiac catheterization. Patient was ambulated on room air to determine if oxygen therapy would be indicated at discharge. He did not have any chest pain or shortness of breath with ambulation even when O2 sat 83%. Cardiac enzymes trended downward last troponin was 4.21 on 12/20. Our recommendation is that the patient follow-up with cardiology as an outpatient to pursue cardiac catheterization once current respiratory symptoms have resolved. Since no wheezing on date of discharge we have ordered a selective beta blocker carvedilol to begin at 3.125 mg twice a day.  Acute hypoxemic respiratory failure secondary to bronchitis Primary reason for presentation. Was hypoxemic with sats in the mid 80s in the ER. Had previously been treated with 3 days of Zithromax without improvement in symptoms. Started on Levaquin this admission which we'll continue for total of 7 days after discharge. Sputum culture pending at discharge. Gram stain revealed abundant gram negative rods and moderate gram positive cocci in pairs/clusters. No further wheezing since steroids have been tapered and weaned. No indication at this time to continue steroid taper using prednisone. On room air patient had an ambulatory sats down to 83% so home oxygen has been ordered. No wheezing with ambulation. We'll continue guaifenesin and cough suppression therapy as prior to admission.  Sats  w/ ambulation will need to be reassessed once the pt has recovered to his baseline, as it is anticipated that he will NOT need long term O2 support.    Psoriasis on methotrexate Patient had previously held last dosage of methotrexate  because he felt himself getting sick and will not resume this medication until current symptoms have resolved.  Nicotine abuse Patient counseled on sequelae of continuing to smoke including death by Dr. Sherral Hammers, as well as Dr. Thereasa Solo  Procedures: 2-D echocardiogram:   - Left ventricle: The cavity size was normal. Wall thickness was normal. Systolic function was normal. The estimated ejection fraction was in the range of 55% to 60%. GLPSS is mildly reduced at -18%, there is focal inferoseptal strain abnormality. Left ventricular diastolic function parameters were normal. - Aortic valve: Sclerosis without stenosis. There was no significant regurgitation. - Mitral valve: Calcified annulus. There was trivial regurgitation. - Left atrium: The atrium was normal in size. - Right atrium: The atrium was mildly dilated.  Subsequent LHC 05/06/14  occ RCA,  LVEDP 15 with nl EF    05/21/2014 1st [facility] office visit/ Joseph Shields   Chief Complaint  Patient presents with   Pulmonary Consult    Referred by Dr. Estil Daft. Pt c/o SOB for the past 2 months "with anything strenuous".  He also c/o prod cough with white sputum. He states that he was dxed with PNA and bronchitis 04/13/14. He is using albuterol about 1 x per day on average.   breathing better since admit and wants to know does he have copd and does he need 02, maintaining off cigs Not limited by breathing from desired activities but not aerobic    06/19/2014 f/u ov/Joseph Shields re: GOLD III copd  Chief Complaint  Patient presents with   Follow-up    Pt states that his breathing has improved some, but not back to his normal baseline. He states that he has used rescue inhaler only 3 x in the past month.   goal is to be able to walk fast - does fine slow pace, does fine sleeping/ sitting. Not using saba much at all at this point/ no noct or early am symptoms or coughing  Rec Start symbicort 160 Take 2 puffs first thing in am and then another 2  puffs about 12 hours later.  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 *   Procedures: Echocardiogram CTA chest with contrast  Consults:  Cardiology, Physical Therapy Evaluation and Treatment, and Occupational Therapy Evaluation and Treatment  Home Health/DME: DME: Home O2, nebulizer and Case Management has organized home health.   Hospital Course:  Joseph Shields is a 77 y.o. 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  Briefly patient is a very pleasant 77 year old male who presented with signs and symptoms concerning for acute heart failure exacerbation with preserved EF. Patient has a known history of CAD, follows with cardiologist Dr. Radford Pax and has scheduled follow-up appointment on 05/17/2022 at 1:30 PM. Cardiology services have evaluated patient during his hospital  stay, he is euvolemic and may slightly volume down based on mild increase in creatinine. Small IV fluid bolus given to patient on the morning of discharge. Discussed discharge plan with patient including home O2 which she did qualify for based on 6-minute walk test, home nebulizer machine with treatments, and follow-up to outpatient providers.  Issues addressed during current hospitalization:  1. HFpEF, acute exacerbation Patient established with Dr. Radford Pax, has follow-up appointment on 05/17/2022 Euvolemic on today's exam, Lasix discontinued Creatinine slightly increased  at 1.5, patient voiding spontaneously, small IV bolus given with 250 mL prior to discharge home. Will hold off on as needed prescription for Lasix at discharge, patient has close follow-up with cardiology on 05/17/2022 and echocardiogram largely unremarkable this hospitalization. CTA chest on arrival did not show any sizable pleural effusions or pulmonary edema  2. COPD with acute exacerbation/bronchitis/emphysema Suspect that this is because of patient's persistent shortness of breath He was oxygen tested today and did qualify for home O2 both at rest and during exertion. Oxygen saturation on room air with patient at rest = 88% Oxygen saturation on room air with exertion / ambulation = 86% Oxygen saturation with exertion / ambulating on oxygen = 92% on 3 lpm Patient to have oxygen delivered to the hospital prior to discharge home Will have follow-up with cardiologist, and also has a scheduled appointment with pulmonology as well. New prescription for nebulizer machine and treatments provided No indication for antibiotic at this time.  3. Hypertension/CAD/remote MI BP currently within acceptable parameters Continue home medications Follow-up with outpatient cardiologist  4. Mixed hyperlipidemia Continue statin    05/18/2022  Re-establish  ov/Joseph Shields re: COPD GOLD 3    maint on prn saba neb  Chief Complaint  Patient presents with   Consult    COPD and SOB with low oxygen sats.  Admit 05/03/22 to 05/05/22.  Stopped Spiriva and Trelegy.  Did not see any improvement with sx.  Dyspnea:  baseline = walking at [employer] same pace as others / [place] 30 min  on setting 6 sob across the room post admit  Cough: rattling cough worse in am clear mucus  Sleeping: on side/ one pillow  SABA SVX:[id number]  02: 3lpm 24/7  Covid status:        No obvious day to day or daytime variability or assoc excess/ purulent sputum or mucus plugs or hemoptysis or cp or chest tightness, subjective wheeze or overt sinus  or hb symptoms.   Sleeping as above without nocturnal  or early am exacerbation  of respiratory  c/o's or need for noct saba. Also denies any obvious fluctuation of symptoms with weather or environmental changes or other aggravating or alleviating factors except as outlined above   No unusual exposure hx or h/o childhood pna/ asthma or knowledge of premature birth.  Current Allergies, Complete Past Medical History, Past Surgical History, Family History, and Social History were reviewed in [employer] record.  ROS  The following are not active complaints unless bolded Hoarseness, sore throat, dysphagia, dental problems, itching, sneezing,  nasal congestion or discharge of excess mucus or purulent secretions, ear ache,   fever, chills, sweats, unintended wt loss or wt gain, classically pleuritic or exertional cp,  orthopnea pnd or arm/hand swelling  or leg swelling, presyncope, palpitations, abdominal pain, anorexia, nausea, vomiting, diarrhea  or change in bowel habits or change in bladder habits, change in stools or change in urine, dysuria, hematuria,  rash, arthralgias, visual complaints, headache, numbness, weakness or  ataxia or problems with walking or coordination,  change in mood or  memory.        Current Meds  Medication Sig   albuterol (VENTOLIN HFA) 108 (90 Base) MCG/ACT inhaler INHALE 2 PUFFS BY MOUTH EVERY 6 HOURS AS NEEDED FOR WHEEZING OR SHORTNESS OF BREATH   aspirin EC 81 MG tablet Take 81 mg by mouth daily.   folic acid (FOLVITE) 1 MG tablet Take 1 tablet (1 mg total) by mouth daily.   metoprolol succinate (TOPROL-XL) 25 MG 24 hr tablet TAKE 1 TABLET EVERY DAY   rosuvastatin (CRESTOR) 20 MG tablet TAKE 1 TABLET EVERY DAY   triamcinolone ointment (KENALOG) 0.1 % APPLY ONE APPLICATION TOPICALLY TWO TIMES A DAY             Objective:   Physical Exam  amb wm nad  05/18/2022        190 .06/19/2014       201     05/21/14 194 lb 6.4 oz (88.179 kg)   05/10/14 192 lb 12.8 oz (87.454 kg)  05/09/14 192 lb (87.091 kg)      Vital signs reviewed  05/18/2022  - Note at rest 02 sats  98% on 3lpm cont    General appearance:    mildly obese amb wm nad  HEENT : Oropharynx  clear     NECK :  without  apparent JVD/ palpable Nodes/TM    LUNGS: no acc muscle use,  Mild barrel  contour chest wall with bilateral  Distant bs s audible wheeze and  without cough on insp or exp maneuvers  and mild  Hyperresonant  to  percussion bilaterally     CV:  RRR  no s3 or murmur or increase in P2, and no edema   ABD: obese  soft and nontender with pos end  insp Hoover's  in the supine position.  No bruits or organomegaly appreciated   MS:    ext warm without deformities Or obvious joint restrictions  calf tenderness, cyanosis or clubbing     SKIN: warm and dry without lesions    NEURO:  alert, approp, nl sensorium with  no motor or cerebellar deficits apparent.       I personally reviewed images and agree with radiology impression as follows:   Chest CTa   05/03/22 1. No pulmonary embolism.  2. Moderate coronary artery calcification.  3. Mild emphysema. Diffuse bronchial wall thickening in keeping with  airway inflammation.        Assessment & Plan:

## 2022-05-18 NOTE — Progress Notes (Signed)
Referral placed.

## 2022-05-18 NOTE — Patient Instructions (Signed)
Make sure you check your oxygen saturation  AT  your highest level of activity (not after you stop)   to be sure it stays over 90% and adjust  02 flow upward to maintain this level if needed but remember to turn it back to previous settings when you stop (to conserve your supply).    Plan A = Automatic = Always=   Breztri Take 2 puffs first thing in am and then another 2 puffs about 12 hours later.    Work on inhaler technique:  relax and gently blow all the way out then take a nice smooth full deep breath back in, triggering the inhaler at same time you start breathing in.  Hold breath in for at least  5 seconds if you can. Blow out breztri  thru nose. Rinse and gargle with water when done.  If mouth or throat bother you at all,  try brushing teeth/gums/tongue with arm and hammer toothpaste/ make a slurry and gargle and spit out.      Plan B = Backup (to supplement plan A, not to replace it) Only use your albuterol inhaler as a rescue medication to be used if you can't catch your breath by resting or doing a relaxed purse lip breathing pattern.  - The less you use it, the better it will work when you need it. - Ok to use the inhaler up to 2 puffs  every 4 hours if you must but call for appointment if use goes up over your usual need - Don't leave home without it !!  (think of it like the spare tire for your car)   Plan C = Crisis (instead of Plan B but only if Plan B stops working) - only use your albuterol nebulizer if you first try Plan B and it fails to help > ok to use the nebulizer up to every 4 hours but if start needing it regularly call for immediate appointment  Make sure you check your oxygen saturation  AT  your highest level of activity (not after you stop)   to be sure it stays over 90% and adjust  02 flow upward to maintain this level if needed but remember to turn it back to previous settings when you stop (to conserve your supply).    Please schedule a follow up office visit in 6  weeks, call sooner if needed

## 2022-05-19 ENCOUNTER — Encounter: Payer: Self-pay | Admitting: Internal Medicine

## 2022-05-19 DIAGNOSIS — J9611 Chronic respiratory failure with hypoxia: Secondary | ICD-10-CM | POA: Insufficient documentation

## 2022-05-19 NOTE — Assessment & Plan Note (Addendum)
02 dep since admit 04/1021 - 05/18/2022   Walked on 3lpm cont  x  2  lap(s) =  approx 300  ft  @ mod  pace, stopped due to sob  with lowest 02 sats 93%    Advised: Make sure you check your oxygen saturation  AT  your highest level of activity (not after you stop)   to be sure it stays over 90% and adjust  02 flow upward to maintain this level if needed but remember to turn it back to previous settings when you stop (to conserve your supply).   F/u 6 weeks with all meds in hand using a trust but verify approach to confirm accurate Medication  Reconciliation The principal here is that until we are certain that the  patients are doing what we've asked, it makes no sense to ask them to do more.   Each maintenance medication was reviewed in detail including emphasizing most importantly the difference between maintenance and prns and under what circumstances the prns are to be triggered using an action plan format where appropriate.  Total time for H and P, chart review, counseling, reviewing hfa/neb/02 device(s) , directly observing portions of ambulatory 02 saturation study/ and generating customized AVS unique to this office visit / same day charting = 35 min post hosp with pt not seen in > 3 y

## 2022-05-19 NOTE — Assessment & Plan Note (Signed)
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  - 02 dep s/p d/c 05/14/22      Group D (now reclassified as E) in terms of symptom/risk and laba/lama/ICS  therefore appropriate rx at this point >>>  breztri and approp saba

## 2022-05-20 NOTE — Addendum Note (Signed)
Addended by: [name] on: 05/20/2022 05:32 PM   Modules accepted: Orders

## 2022-05-25 NOTE — Progress Notes (Signed)
Fax from [facility] for updated O2 order faxed back to [phone]

## 2022-05-26 ENCOUNTER — Other Ambulatory Visit: Payer: Self-pay | Admitting: Family Medicine

## 2022-05-26 DIAGNOSIS — L409 Psoriasis, unspecified: Secondary | ICD-10-CM

## 2022-06-09 ENCOUNTER — Ambulatory Visit (HOSPITAL_COMMUNITY): Payer: Medicare HMO | Attending: Cardiology

## 2022-06-09 DIAGNOSIS — I509 Heart failure, unspecified: Secondary | ICD-10-CM | POA: Diagnosis not present

## 2022-06-09 LAB — ECHOCARDIOGRAM COMPLETE
Area-P 1/2: 2.76 cm2
S' Lateral: 3.4 cm

## 2022-06-10 ENCOUNTER — Other Ambulatory Visit: Payer: Self-pay | Admitting: *Deleted

## 2022-06-10 MED ORDER — BREZTRI AEROSPHERE 160-9-4.8 MCG/ACT IN AERO: 2.0000 | INHALATION_SPRAY | Freq: Two times a day (BID) | RESPIRATORY_TRACT | 3 refills | Status: DC

## 2022-06-14 ENCOUNTER — Telehealth: Payer: Self-pay | Admitting: Family Medicine

## 2022-06-14 NOTE — Telephone Encounter (Signed)
Called patient to schedule Medicare Annual Wellness Visit (AWV). Left message for patient to call back and schedule Medicare Annual Wellness Visit (AWV).  Last date of AWV: 09/18/2020   Please schedule an appointment at any time with [facility].  If any questions, please contact me at [phone].  Thank you,  [place]ilt ??[phone]

## 2022-06-14 NOTE — Telephone Encounter (Signed)
[place]. to schedule their annual wellness visit. Appointment made for 06/16/2022. Thank you,  [place] ??[phone]

## 2022-06-16 ENCOUNTER — Ambulatory Visit (INDEPENDENT_AMBULATORY_CARE_PROVIDER_SITE_OTHER): Payer: Medicare HMO

## 2022-06-16 VITALS — Ht 68.0 in | Wt 190.0 lb

## 2022-06-16 DIAGNOSIS — Z0000 Encounter for general adult medical examination without abnormal findings: Secondary | ICD-10-CM

## 2022-06-16 NOTE — Progress Notes (Signed)
Subjective:   Joseph Shields. is a 77 y.o. male who presents for Medicare Annual/Subsequent preventive examination. I connected with  Laveda Norman. on 06/16/22 by a audio enabled telemedicine application and verified that I am speaking with the correct person using two identifiers.  Patient Location: Home  Provider Location: Home Office  I discussed the limitations of evaluation and management by telemedicine. The patient expressed understanding and agreed to proceed.  Review of Systems     Cardiac Risk Factors include: advanced age (>25mn, >>4women);male gender;hypertension;dyslipidemia     Objective:    Today's Vitals   06/16/22 1206  Weight: 190 lb (86.2 kg)  Height: 5' 8"$  (1.727 m)   Body mass index is 28.89 kg/m.     06/16/2022   12:09 PM 09/18/2020    3:44 PM 08/24/2019   11:02 AM 07/25/2017   10:48 AM 05/06/2014    9:03 AM 04/14/2014   12:34 AM 04/13/2014    5:16 PM  Advanced Directives  Does Patient Have a Medical Advance Directive? No No No No No No No  Would patient like information on creating a medical advance directive? No - Patient declined Yes (MAU/Ambulatory/Procedural Areas - Information given) Yes (MAU/Ambulatory/Procedural Areas - Information given) Yes (MAU/Ambulatory/Procedural Areas - Information given) No - patient declined information No - patient declined information     Current Medications (verified) Outpatient Encounter Medications as of 06/16/2022  Medication Sig   albuterol (VENTOLIN HFA) 108 (90 Base) MCG/ACT inhaler INHALE 2 PUFFS BY MOUTH EVERY 6 HOURS AS NEEDED FOR WHEEZING OR SHORTNESS OF BREATH   aspirin EC 81 MG tablet Take 81 mg by mouth daily.   Budeson-Glycopyrrol-Formoterol (BREZTRI AEROSPHERE) 160-9-4.8 MCG/ACT AERO Inhale 2 puffs into the lungs 2 (two) times daily.   folic acid (FOLVITE) 1 MG tablet Take 1 tablet (1 mg total) by mouth daily.   metoprolol succinate (TOPROL-XL) 25 MG 24 hr tablet TAKE 1 TABLET EVERY DAY    rosuvastatin (CRESTOR) 20 MG tablet TAKE 1 TABLET EVERY DAY   triamcinolone ointment (KENALOG) 0.1 % APPLY ONE APPLICATION TOPICALLY TWO TIMES A DAY   methotrexate (RHEUMATREX) 2.5 MG tablet Take 4 tablets by mouth once a week. Caution:Chemotherapy. Protect from light. (CURRENTLY ON HOLD) (Patient not taking: Reported on 06/16/2022)   No facility-administered encounter medications on file as of 06/16/2022.    Allergies (verified) Flonase [fluticasone propionate] and Penicillins   History: Past Medical History:  Diagnosis Date   CAD (coronary artery disease)    a. 03/2014 NSTEMI - initially refused cath;  b. 03/2014 Echo: EF 55-60%;  c. 04/2014 Cath: LM nl, LAD 20-30p, 470mD1 30-40, D2 small, 60-70, LCX nl, RCA 100p (CTO), L->R collaterals, EF 55-60%-->Med Rx.   [place]ch[place]) [place] [place]   History of tobacco abuse    a. 40+ pack years, quit 03/2014.   Hyperlipidemia    Morbid [place]   Psoriasis    Past Surgical History:  Procedure Laterality Date   CATARACT EXTRACTION, BILATERAL     LEFT HEART CATHETERIZATION WITH CORONARY ANGIOGRAM N/A 05/06/2014   Procedure: LEFT HEART CATHETERIZATION WITH CORONARY ANGIOGRAM;  Surgeon: DaLeonie ManMD;  [facility];  Service: Cardiovascular;  Laterality: N/A;   SPINE SURGERY     Family History  Problem Relation Age of Onset   Stroke Mother 7830 Allergies Mother    Asthma Mother    Cancer Mother  colon   Heart disease Father 22       quadruple bypass   Healthy Daughter    Healthy Daughter    Social History   Socioeconomic History   Marital status: Married    Spouse name: Hassan Rowan   Number of children: 2   Years of education: associates degree   Highest education level: Associate degree: occupational, Hotel manager, or vocational program  Occupational History   Occupation: Retired    Comment: Hotel manager  Tobacco Use   Smoking status: Former    Packs/day: 0.50     Years: 49.00    Total pack years: 24.50    Types: Cigarettes    Quit date: 04/07/2014    Years since quitting: 8.1   Smokeless tobacco: Never  Vaping Use   Vaping Use: Never used  Substance and Sexual Activity   Alcohol use: No    Alcohol/week: 0.0 standard drinks of alcohol   Drug use: No   Sexual activity: Yes  Other Topics Concern   Not on file  Social History Narrative   Lives home with wife. Children live nearby   Social Determinants of Health   Financial Resource Strain: Low Risk  (06/16/2022)   Overall Financial Resource Strain (CARDIA)    Difficulty of Paying Living Expenses: Not hard at all  Food Insecurity: No Food Insecurity (06/16/2022)   Hunger Vital Sign    Worried About Running Out of Food in the Last Year: Never true    Ran Out of Food in the Last Year: Never true  Transportation Needs: No Transportation Needs (06/16/2022)   PRAPARE - Hydrologist (Medical): No    Lack of Transportation (Non-Medical): No  Physical Activity: Inactive (06/16/2022)   Exercise Vital Sign    Days of Exercise per Week: 0 days    Minutes of Exercise per Session: 0 min  Stress: No Stress Concern Present (06/16/2022)   [place]    Feeling of Stress : Not at all  Social Connections: Moderately Integrated (06/16/2022)   Social Connection and Isolation Panel [NHANES]    Frequency of Communication with Friends and Family: More than three times a week    Frequency of Social Gatherings with Friends and Family: More than three times a week    Attends Religious Services: More than 4 times per year    Active Member of [employer] or Organizations: No    Attends Music therapist: Never    Marital Status: Married    Tobacco Counseling Counseling given: Not Answered   Clinical Intake:  Pre-visit preparation completed: Yes  Pain : No/denies pain     Nutritional Risks: None Diabetes:  No  How often do you need to have someone help you when you read instructions, pamphlets, or other written materials from your doctor or pharmacy?: 1 - Never  Diabetic?no   Interpreter Needed?: No  Information entered by :: Jadene Pierini, LPN   Activities of Daily Living    06/16/2022   12:09 PM  In your present state of health, do you have any difficulty performing the following activities:  Hearing? 0  Vision? 0  Difficulty concentrating or making decisions? 0  Walking or climbing stairs? 0  Dressing or bathing? 0  Doing errands, shopping? 0  Preparing Food and eating ? N  Using the Toilet? N  In the past six months, have you accidently leaked urine? N  Do you have problems  with loss of bowel control? N  Managing your Medications? N  Managing your Finances? N  Housekeeping or managing your Housekeeping? N    Patient Care Team: [name], MD as PCP - General (Family Medicine) [name], MD as PCP - Cardiology (Cardiology) Harlen Labs, MD as Referring Physician (Optometry)  Indicate any recent Medical Services you may have received from other than [facility] providers in the past year (date may be approximate).     Assessment:   This is a routine wellness examination for Joseph Shields.  Hearing/Vision screen Vision Screening - Comments:: Wears rx glasses - up to date with routine eye exams with  Dr.Lee   Dietary issues and exercise activities discussed: Current Exercise Habits: The patient does not participate in regular exercise at present, Exercise limited by: respiratory conditions(s)   Goals Addressed             This Visit's Progress    SOCIALIZATION   On track    08/24/2019 AWV Goal: Increased Socialization  Over the next year, patient will increase socialization by talking/visiting with friends/family at least 3 times per week.  Patient will verbalize understanding of available community resources and [employer] throughout [employer]. Patient  will regularly participate in social interaction through community centers, church, etc.  Patient will have knowledge of the local programs offered to senior citizens that can increase activity level and socialization.        Depression Screen    06/16/2022   12:08 PM 04/05/2022    2:38 PM 03/15/2022   10:47 AM 09/01/2021   10:11 AM 03/04/2021   10:00 AM 11/28/2020   10:23 AM 09/18/2020    3:37 PM  PHQ 2/9 Scores  PHQ - 2 Score 0 0 0 3 0 0 0  PHQ- 9 Score  0  3       Fall Risk    06/16/2022   12:07 PM 04/05/2022    2:38 PM 03/15/2022   10:47 AM 09/01/2021   10:11 AM 03/04/2021   10:00 AM  Fall Risk   Falls in the past year? 0 0 0 0 0  Number falls in past yr: 0      Injury with Fall? 0      Risk for fall due to : No Fall Risks      Follow up Falls prevention discussed        [place]:  Any stairs in or around the home? No  If so, are there any without handrails? No  Home free of loose throw rugs in walkways, pet beds, electrical cords, etc? Yes  Adequate lighting in your home to reduce risk of falls? Yes   ASSISTIVE DEVICES UTILIZED TO PREVENT FALLS:  Life alert? No  Use of a cane, walker or w/c? No  Grab bars in the bathroom? Yes  Shower chair or bench in shower? Yes  Elevated toilet seat or a handicapped toilet? Yes       07/25/2017   11:13 AM  MMSE - Mini Mental State Exam  Orientation to time 5  Orientation to Place 5  Registration 3  Attention/ Calculation 5  Recall 2  Language- name 2 objects 2  Language- repeat 1  Language- follow 3 step command 3  Language- read & follow direction 1  Write a sentence 1  Copy design 1  Total score 29        06/16/2022   12:10 PM 08/24/2019  11:03 AM 08/23/2018    9:17 AM  6CIT Screen  What Year? 0 points 0 points 0 points  What month? 0 points 0 points 0 points  What time? 0 points 0 points 0 points  Count back from 20 0 points 0 points 0 points  Months in reverse 0 points 0  points 0 points  Repeat phrase 0 points 0 points 0 points  Total Score 0 points 0 points 0 points    Immunizations Immunization History  Administered Date(s) Administered   Influenza, High Dose Seasonal PF 04/04/2015   Moderna Sars-Covid-2 Vaccination 05/16/2019, 06/13/2019, 04/23/2020   Pneumococcal Conjugate-13 04/04/2015   Pneumococcal Polysaccharide-23 10/17/2017    TDAP status: Due, Education has been provided regarding the importance of this vaccine. Advised may receive this vaccine at local pharmacy or Health Dept. Aware to provide a copy of the vaccination record if obtained from local pharmacy or Health Dept. Verbalized acceptance and understanding.  Flu Vaccine status: Declined, Education has been provided regarding the importance of this vaccine but patient still declined. Advised may receive this vaccine at local pharmacy or Health Dept. Aware to provide a copy of the vaccination record if obtained from local pharmacy or Health Dept. Verbalized acceptance and understanding.  Pneumococcal vaccine status: Up to date  Covid-19 vaccine status: Completed vaccines  Qualifies for Shingles Vaccine? Yes   Zostavax completed No   Shingrix Completed?: No.    Education has been provided regarding the importance of this vaccine. Patient has been advised to call insurance company to determine out of pocket expense if they have not yet received this vaccine. Advised may also receive vaccine at local pharmacy or Health Dept. Verbalized acceptance and understanding.  Screening Tests Health Maintenance  Topic Date Due   DTaP/Tdap/Td (1 - Tdap) Never done   Zoster Vaccines- Shingrix (1 of 2) Never done   Lung Cancer Screening  04/14/2015   COVID-19 Vaccine (4 - 2023-24 season) 12/25/2021   INFLUENZA VACCINE  07/25/2022 (Originally 11/24/2021)   COLON CANCER SCREENING ANNUAL FOBT  10/15/2022   Medicare Annual Wellness (AWV)  06/17/2023   Pneumonia Vaccine 15+ Years old  Completed    Hepatitis C Screening  Completed   HPV VACCINES  Aged Out   COLONOSCOPY (Pts 45-77yr Insurance coverage will need to be confirmed)  Discontinued    Health Maintenance  Health Maintenance Due  Topic Date Due   DTaP/Tdap/Td (1 - Tdap) Never done   Zoster Vaccines- Shingrix (1 of 2) Never done   Lung Cancer Screening  04/14/2015   COVID-19 Vaccine (4 - 2023-24 season) 12/25/2021    Colorectal cancer screening: No longer required.   Lung Cancer Screening: (Low Dose CT Chest recommended if Age 10644-80years, 30 pack-year currently smoking OR have quit w/in 15years.) does not qualify.   Lung Cancer Screening Referral: n/a  Additional Screening:  Hepatitis C Screening: does not qualify;   Vision Screening: Recommended annual ophthalmology exams for early detection of glaucoma and other disorders of the eye. Is the patient up to date with their annual eye exam?  Yes  Who is the provider or what is the name of the office in which the patient attends annual eye exams? Dr.Lee  If pt is not established with a provider, would they like to be referred to a provider to establish care? No .   Dental Screening: Recommended annual dental exams for proper oral hygiene  Community Resource Referral / Chronic Care Management: CRR required this visit?  No  CCM required this visit?  No      Plan:     I have personally reviewed and noted the following in the patient's chart:   Medical and social history Use of alcohol, tobacco or illicit drugs  Current medications and supplements including opioid prescriptions. Patient is not currently taking opioid prescriptions. Functional ability and status Nutritional status Physical activity Advanced directives List of other physicians Hospitalizations, surgeries, and ER visits in previous 12 months Vitals Screenings to include cognitive, depression, and falls Referrals and appointments  In addition, I have reviewed and discussed with patient  certain preventive protocols, quality metrics, and best practice recommendations. A written personalized care plan for preventive services as well as general preventive health recommendations were provided to patient.     Daphane Shepherd, LPN   [id number]   Nurse Notes: Due TDAP Vaccine

## 2022-06-16 NOTE — Patient Instructions (Signed)
Mr. Joseph Shields , Thank you for taking time to come for your Medicare Wellness Visit. I appreciate your ongoing commitment to your health goals. Please review the following plan we discussed and let me know if I can assist you in the future.   These are the goals we discussed:  Goals      EXERCISE FOR GENERAL HEALTH     08/24/2019 AWV Goal: Exercise for General Health  Patient will verbalize understanding of the benefits of increased physical activity: Exercising regularly is important. It will improve your overall fitness, flexibility, and endurance. Regular exercise also will improve your overall health. It can help you control your weight, reduce stress, and improve your bone density. Over the next year, patient will increase physical activity as tolerated with a goal of at least 150 minutes of moderate physical activity per week.  You can tell that you are exercising at a moderate intensity if your heart starts beating faster and you start breathing faster but can still hold a conversation. Moderate-intensity exercise ideas include: Walking 1 mile (1.6 km) in about 15 minutes Biking Hiking Golfing Dancing Water aerobics Patient will verbalize understanding of everyday activities that increase physical activity by providing examples like the following: Yard work, such as: Sales promotion account executive Gardening Washing windows or floors Patient will be able to explain general safety guidelines for exercising:  Before you start a new exercise program, talk with your health care provider. Do not exercise so much that you hurt yourself, feel dizzy, or get very short of breath. Wear comfortable clothes and wear shoes with good support. Drink plenty of water while you exercise to prevent dehydration or heat stroke. Work out until your breathing and your heartbeat get faster.      SOCIALIZATION     08/24/2019 AWV Goal:  Increased Socialization  Over the next year, patient will increase socialization by talking/visiting with friends/family at least 3 times per week.  Patient will verbalize understanding of available community resources and [employer] throughout [employer]. Patient will regularly participate in social interaction through community centers, church, etc.  Patient will have knowledge of the local programs offered to senior citizens that can increase activity level and socialization.         This is a list of the screening recommended for you and due dates:  Health Maintenance  Topic Date Due   DTaP/Tdap/Td vaccine (1 - Tdap) Never done   Zoster (Shingles) Vaccine (1 of 2) Never done   Screening for Lung Cancer  04/14/2015   COVID-19 Vaccine (4 - 2023-24 season) 12/25/2021   Flu Shot  07/25/2022*   Stool Blood Test  10/15/2022   Medicare Annual Wellness Visit  06/17/2023   Pneumonia Vaccine  Completed   Hepatitis C Screening: USPSTF Recommendation to screen - Ages 18-79 yo.  Completed   HPV Vaccine  Aged Out   Colon Cancer Screening  Discontinued  *Topic was postponed. The date shown is not the original due date.    Advanced directives: Advance directive discussed with you today. I have provided a copy for you to complete at home and have notarized. Once this is complete please bring a copy in to our office so we can scan it into your chart.   Conditions/risks identified: Aim for 30 minutes of exercise or brisk walking, 6-8 glasses of water, and 5 servings of fruits and vegetables each day.   Next appointment: Follow  up in one year for your annual wellness visit.   Preventive Care 44 Years and Older, Male  Preventive care refers to lifestyle choices and visits with your health care provider that can promote health and wellness. What does preventive care include? A yearly physical exam. This is also called an annual well check. Dental exams once or twice a year. Routine eye  exams. Ask your health care provider how often you should have your eyes checked. Personal lifestyle choices, including: Daily care of your teeth and gums. Regular physical activity. Eating a healthy diet. Avoiding tobacco and drug use. Limiting alcohol use. Practicing safe sex. Taking low doses of aspirin every day. Taking vitamin and mineral supplements as recommended by your health care provider. What happens during an annual well check? The services and screenings done by your health care provider during your annual well check will depend on your age, overall health, lifestyle risk factors, and family history of disease. Counseling  Your health care provider may ask you questions about your: Alcohol use. Tobacco use. Drug use. Emotional well-being. Home and relationship well-being. Sexual activity. Eating habits. History of falls. Memory and ability to understand (cognition). Work and work Statistician. Screening  You may have the following tests or measurements: Height, weight, and BMI. Blood pressure. Lipid and cholesterol levels. These may be checked every 5 years, or more frequently if you are over 2 years old. Skin check. Lung cancer screening. You may have this screening every year starting at age 57 if you have a 30-pack-year history of smoking and currently smoke or have quit within the past 15 years. Fecal occult blood test (FOBT) of the stool. You may have this test every year starting at age 55. Flexible sigmoidoscopy or colonoscopy. You may have a sigmoidoscopy every 5 years or a colonoscopy every 10 years starting at age 53. Prostate cancer screening. Recommendations will vary depending on your family history and other risks. Hepatitis C blood test. Hepatitis B blood test. Sexually transmitted disease (STD) testing. Diabetes screening. This is done by checking your blood sugar (glucose) after you have not eaten for a while (fasting). You may have this done every  1-3 years. Abdominal aortic aneurysm (AAA) screening. You may need this if you are a current or former smoker. Osteoporosis. You may be screened starting at age 47 if you are at high risk. Talk with your health care provider about your test results, treatment options, and if necessary, the need for more tests. Vaccines  Your health care provider may recommend certain vaccines, such as: Influenza vaccine. This is recommended every year. Tetanus, diphtheria, and acellular pertussis (Tdap, Td) vaccine. You may need a Td booster every 10 years. Zoster vaccine. You may need this after age 22. Pneumococcal 13-valent conjugate (PCV13) vaccine. One dose is recommended after age 74. Pneumococcal polysaccharide (PPSV23) vaccine. One dose is recommended after age 67. Talk to your health care provider about which screenings and vaccines you need and how often you need them. This information is not intended to replace advice given to you by your health care provider. Make sure you discuss any questions you have with your health care provider. Document Released: 05/09/2015 Document Revised: 12/31/2015 Document Reviewed: 02/11/2015 Elsevier Interactive Patient Education  2017 [place] Prevention in the Home Falls can cause injuries. They can happen to people of all ages. There are many things you can do to make your home safe and to help prevent falls. What can I do on the  outside of my home? Regularly fix the edges of walkways and driveways and fix any cracks. Remove anything that might make you trip as you walk through a door, such as a raised step or threshold. Trim any bushes or trees on the path to your home. Use bright outdoor lighting. Clear any walking paths of anything that might make someone trip, such as rocks or tools. Regularly check to see if handrails are loose or broken. Make sure that both sides of any steps have handrails. Any raised decks and porches should have guardrails on  the edges. Have any leaves, snow, or ice cleared regularly. Use sand or salt on walking paths during winter. Clean up any spills in your garage right away. This includes oil or grease spills. What can I do in the bathroom? Use night lights. Install grab bars by the toilet and in the tub and shower. Do not use towel bars as grab bars. Use non-skid mats or decals in the tub or shower. If you need to sit down in the shower, use a plastic, non-slip stool. Keep the floor dry. Clean up any water that spills on the floor as soon as it happens. Remove soap buildup in the tub or shower regularly. Attach bath mats securely with double-sided non-slip rug tape. Do not have throw rugs and other things on the floor that can make you trip. What can I do in the bedroom? Use night lights. Make sure that you have a light by your bed that is easy to reach. Do not use any sheets or blankets that are too big for your bed. They should not hang down onto the floor. Have a firm chair that has side arms. You can use this for support while you get dressed. Do not have throw rugs and other things on the floor that can make you trip. What can I do in the kitchen? Clean up any spills right away. Avoid walking on wet floors. Keep items that you use a lot in easy-to-reach places. If you need to reach something above you, use a strong step stool that has a grab bar. Keep electrical cords out of the way. Do not use floor polish or wax that makes floors slippery. If you must use wax, use non-skid floor wax. Do not have throw rugs and other things on the floor that can make you trip. What can I do with my stairs? Do not leave any items on the stairs. Make sure that there are handrails on both sides of the stairs and use them. Fix handrails that are broken or loose. Make sure that handrails are as long as the stairways. Check any carpeting to make sure that it is firmly attached to the stairs. Fix any carpet that is loose  or worn. Avoid having throw rugs at the top or bottom of the stairs. If you do have throw rugs, attach them to the floor with carpet tape. Make sure that you have a light switch at the top of the stairs and the bottom of the stairs. If you do not have them, ask someone to add them for you. What else can I do to help prevent falls? Wear shoes that: Do not have high heels. Have rubber bottoms. Are comfortable and fit you well. Are closed at the toe. Do not wear sandals. If you use a stepladder: Make sure that it is fully opened. Do not climb a closed stepladder. Make sure that both sides of the stepladder are locked into  place. Ask someone to hold it for you, if possible. Clearly mark and make sure that you can see: Any grab bars or handrails. First and last steps. Where the edge of each step is. Use tools that help you move around (mobility aids) if they are needed. These include: Canes. Walkers. Scooters. Crutches. Turn on the lights when you go into a dark area. Replace any light bulbs as soon as they burn out. Set up your furniture so you have a clear path. Avoid moving your furniture around. If any of your floors are uneven, fix them. If there are any pets around you, be aware of where they are. Review your medicines with your doctor. Some medicines can make you feel dizzy. This can increase your chance of falling. Ask your doctor what other things that you can do to help prevent falls. This information is not intended to replace advice given to you by your health care provider. Make sure you discuss any questions you have with your health care provider. Document Released: 02/06/2009 Document Revised: 09/18/2015 Document Reviewed: 05/17/2014 Elsevier Interactive Patient Education  2017 [employer].

## 2022-06-29 ENCOUNTER — Ambulatory Visit (INDEPENDENT_AMBULATORY_CARE_PROVIDER_SITE_OTHER): Payer: Medicare HMO | Admitting: Internal Medicine

## 2022-06-29 ENCOUNTER — Encounter: Payer: Self-pay | Admitting: Internal Medicine

## 2022-06-29 VITALS — BP 126/60 | HR 63 | Temp 98.2°F | Ht 68.0 in | Wt 190.0 lb

## 2022-06-29 DIAGNOSIS — J9611 Chronic respiratory failure with hypoxia: Secondary | ICD-10-CM | POA: Diagnosis not present

## 2022-06-29 DIAGNOSIS — J441 Chronic obstructive pulmonary disease with (acute) exacerbation: Secondary | ICD-10-CM | POA: Diagnosis not present

## 2022-06-29 NOTE — Assessment & Plan Note (Signed)
  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  - 02 dep s/p d/c 05/14/22   Christinia Gully, MD [facility] [phone]   After 7:00 pm call Elink  [phone] Group D (now reclassified as E) in terms of symptom/risk and laba/lama/ICS  therefore appropriate rx at this point >>>  breztri and approp saba

## 2022-06-29 NOTE — Patient Instructions (Addendum)
My office will be contacting you by phone for referral to [facility] for best fit for portable 02   - if you don't hear back from my office within one week please call us back or notify us thru MyChart and we'll address it right away.    Make sure you check your oxygen saturation  AT  your highest level of activity (not after you stop)   to be sure it stays over 90% and adjust  02 flow upward to maintain this level if needed but remember to turn it back to previous settings when you stop (to conserve your supply).   Please schedule a follow up visit in 3 months but call sooner if needed

## 2022-06-29 NOTE — Assessment & Plan Note (Signed)
02 dep since admit 04/1021 - 05/18/2022   Walked on 3lpm cont  x  2  lap(s) =  approx 500 ft  @ mod  pace, stopped due to sob  with lowest 02 sats 93%   - 06/29/2022   Walked on RA  x  2  lap(s) =  approx 500  ft  @ mod pace, stopped due to desats to 87% then placed on 4lpm POC  with lowest 02 sats 94% and finished another 250 ft  He clearly is improving and does qualify for POC so advised:  Make sure you check your oxygen saturation  AT  your highest level of activity (not after you stop)   to be sure it stays over 90% and adjust  02 flow upward to maintain this level if needed but remember to turn it back to previous settings when you stop (to conserve your supply).   F/u  3 m, sooner if needed  Each maintenance medication was reviewed in detail including emphasizing most importantly the difference between maintenance and prns and under what circumstances the prns are to be triggered using an action plan format where appropriate.  Total time for H and P, chart review, counseling, reviewing hfa/02 device(s) , directly observing portions of ambulatory 02 saturation study/ and generating customized AVS unique to this office visit / same day charting  > 30 min

## 2022-06-29 NOTE — Progress Notes (Signed)
Subjective:   Patient ID: Joseph Shields, male    DOB: 08/03/1945   MRN: [id number]    Brief patient profile:  71 yowm  Quit Smoking  04/07/14 when admit with dx of pna and better since discharge to point where can now get up the hill to feed the dog but at one point couldn't make it up the hill and self referred to pulmonary 05/21/2014 for eval and proved to have GOLD III copd/ mildly reversible  Admit date: 04/13/2014 Discharge date: 04/15/2014  Discharge Diagnoses:     SIRS   Acute respiratory failure with hypoxia due to acute bronchitis/failed OP therapy   NSTEMI (non-ST elevated myocardial infarction)/refused IP cath   Psoriasis   Smoker    Cough due to acute bronchitis      History of present illness:   77 year old male patient with history of psoriasis on methotrexate. Presented with complaints of cough and fever. Had initially been evaluated by his primary care physician and was started on azithromycin and cough suppressant therapy but after 3 days of treatment symptoms were worsening with dyspnea on exertion and extreme fatigue.  Evaluation in the ER revealed a mildly hypertensive patient with tachycardia.  He was afebrile with room air sats dipping to 83-84%. He had a white count of 20,300. Chest x-ray was suggestive of bronchitis. He also had elevated troponin at 3.79, total CK of 428, and a CKMB of 29. Patient had a prolonged stay in the ER and a second troponin was elevated at 6.16. EKG was nonischemic and only demonstrated sinus tachycardia with nonspecific ST abnormality. Cardiology was consulted. A stat echo was completed in the ER which revealed normal LVEF without any obvious wall motion abnormalities. CT of the chest had also been accomplished and demonstrated coronary artery calcifications. No indications for emergent catheterization per Cardiology. Because of active wheezing at time of presentation beta blockers were not initiated.   Hospital Course:     NSTEMI Evaluated by cardiology this admission. Initially no beta blockers due to active wheezing at presentation. Heparin drip was initiated. Lipid panel revealed suboptimal HDL at 28. Statin therapy initiated. Cardiology offered cardiac catheterization to the patient but he refused. He is amenable to proceeding with outpatient cardiac catheterization. Patient was ambulated on room air to determine if oxygen therapy would be indicated at discharge. He did not have any chest pain or shortness of breath with ambulation even when O2 sat 83%. Cardiac enzymes trended downward last troponin was 4.21 on 12/20. Our recommendation is that the patient follow-up with cardiology as an outpatient to pursue cardiac catheterization once current respiratory symptoms have resolved. Since no wheezing on date of discharge we have ordered a selective beta blocker carvedilol to begin at 3.125 mg twice a day.  Acute hypoxemic respiratory failure secondary to bronchitis Primary reason for presentation. Was hypoxemic with sats in the mid 80s in the ER. Had previously been treated with 3 days of Zithromax without improvement in symptoms. Started on Levaquin this admission which we'll continue for total of 7 days after discharge. Sputum culture pending at discharge. Gram stain revealed abundant gram negative rods and moderate gram positive cocci in pairs/clusters. No further wheezing since steroids have been tapered and weaned. No indication at this time to continue steroid taper using prednisone. On room air patient had an ambulatory sats down to 83% so home oxygen has been ordered. No wheezing with ambulation. We'll continue guaifenesin and cough suppression therapy as prior to admission.  Sats  w/ ambulation will need to be reassessed once the pt has recovered to his baseline, as it is anticipated that he will NOT need long term O2 support.    Psoriasis on methotrexate Patient had previously held last dosage of methotrexate  because he felt himself getting sick and will not resume this medication until current symptoms have resolved.  Nicotine abuse Patient counseled on sequelae of continuing to smoke including death by Dr. Sherral Hammers, as well as Dr. Thereasa Solo  Procedures: 2-D echocardiogram:   - Left ventricle: The cavity size was normal. Wall thickness was normal. Systolic function was normal. The estimated ejection fraction was in the range of 55% to 60%. GLPSS is mildly reduced at -18%, there is focal inferoseptal strain abnormality. Left ventricular diastolic function parameters were normal. - Aortic valve: Sclerosis without stenosis. There was no significant regurgitation. - Mitral valve: Calcified annulus. There was trivial regurgitation. - Left atrium: The atrium was normal in size. - Right atrium: The atrium was mildly dilated.  Subsequent LHC 05/06/14  occ RCA,  LVEDP 15 with nl EF    05/21/2014 1st [facility] office visit/ Joseph Shields   Chief Complaint  Patient presents with   Pulmonary Consult    Referred by Dr. Estil Daft. Pt c/o SOB for the past 2 months "with anything strenuous".  He also c/o prod cough with white sputum. He states that he was dxed with PNA and bronchitis 04/13/14. He is using albuterol about 1 x per day on average.   breathing better since admit and wants to know does he have copd and does he need 02, maintaining off cigs Not limited by breathing from desired activities but not aerobic    06/19/2014 f/u ov/Joseph Shields re: GOLD III copd  Chief Complaint  Patient presents with   Follow-up    Pt states that his breathing has improved some, but not back to his normal baseline. He states that he has used rescue inhaler only 3 x in the past month.   goal is to be able to walk fast - does fine slow pace, does fine sleeping/ sitting. Not using saba much at all at this point/ no noct or early am symptoms or coughing  Rec Start symbicort 160 Take 2 puffs first thing in am and then another 2  puffs about 12 hours later.  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 *   Procedures: Echocardiogram CTA chest with contrast  Consults:  Cardiology, Physical Therapy Evaluation and Treatment, and Occupational Therapy Evaluation and Treatment  Home Health/DME: DME: Home O2, nebulizer and Case Management has organized home health.   Hospital Course:  Joseph Shields is a 77 y.o. 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  Briefly patient is a very pleasant 77 year old male who presented with signs and symptoms concerning for acute heart failure exacerbation with preserved EF. Patient has a known history of CAD, follows with cardiologist Dr. Radford Pax and has scheduled follow-up appointment on 05/17/2022 at 1:30 PM. Cardiology services have evaluated patient during his hospital  stay, he is euvolemic and may slightly volume down based on mild increase in creatinine. Small IV fluid bolus given to patient on the morning of discharge. Discussed discharge plan with patient including home O2 which she did qualify for based on 6-minute walk test, home nebulizer machine with treatments, and follow-up to outpatient providers.  Issues addressed during current hospitalization:  1. HFpEF, acute exacerbation Patient established with Dr. Radford Pax, has follow-up appointment on 05/17/2022 Euvolemic on today's exam, Lasix discontinued Creatinine slightly increased  at 1.5, patient voiding spontaneously, small IV bolus given with 250 mL prior to discharge home. Will hold off on as needed prescription for Lasix at discharge, patient has close follow-up with cardiology on 05/17/2022 and echocardiogram largely unremarkable this hospitalization. CTA chest on arrival did not show any sizable pleural effusions or pulmonary edema  2. COPD with acute exacerbation/bronchitis/emphysema Suspect that this is because of patient's persistent shortness of breath He was oxygen tested today and did qualify for home O2 both at rest and during exertion. Oxygen saturation on room air with patient at rest = 88% Oxygen saturation on room air with exertion / ambulation = 86% Oxygen saturation with exertion / ambulating on oxygen = 92% on 3 lpm Patient to have oxygen delivered to the hospital prior to discharge home Will have follow-up with cardiologist, and also has a scheduled appointment with pulmonology as well. New prescription for nebulizer machine and treatments provided No indication for antibiotic at this time.  3. Hypertension/CAD/remote MI BP currently within acceptable parameters Continue home medications Follow-up with outpatient cardiologist  4. Mixed hyperlipidemia Continue statin    05/18/2022  Re-establish  ov/Joseph Shields re: COPD GOLD 3    maint on prn saba neb  Chief Complaint  Patient presents with   Consult    COPD and SOB with low oxygen sats.  Admit 05/03/22 to 05/05/22.  Stopped Spiriva and Trelegy.  Did not see any improvement with sx.  Dyspnea:  baseline = walking at [employer] same pace as others / [place] 30 min  on setting 6 sob across the room post admit  Cough: rattling cough worse in am clear mucus  Sleeping: on side/ one pillow  SABA [id number]  02: 3lpm 24/7   Rec Make sure you check your oxygen saturation  AT  your highest level of activity (not after you stop)   to be sure it stays over 90%   Plan A = Automatic = Always=   Breztri Take 2 puffs  first thing in am and then another 2 puffs about 12 hours later.   Work on inhaler technique: Plan B = Backup (to supplement plan A, not to replace it) Only use your albuterol inhaler as a rescue medication Plan C = Crisis (instead of Plan B but only if Plan B stops working) - only use your albuterol nebulizer if you first try Plan B  Make sure you check your oxygen saturation  AT  your highest level of activity (not after you stop)   to be sure it stays over 90%    06/29/2022  f/u ov/Joseph Shields re: copd GOLD 3  maint on Breztri 2bid   Chief Complaint  Patient presents with   Follow-up    Breathing has improved. He has still not had to use his rescue inhaler. No new co's.   [place]/get up the hill to feed the dog easier  Dyspnea:  improved  Cough: better  Sleeping: flat bed/ on side one pillow SABA use: none  02: 3lpm  hs and sitting still on 3lpm  Covid status:   vax x 3  Lung cancer screening :  05/04/23     No obvious day to day or daytime variability or assoc excess/ purulent sputum or mucus plugs or hemoptysis or cp or chest tightness, subjective wheeze or overt sinus or hb symptoms.   Sleeping  without nocturnal  or early am exacerbation  of respiratory  c/o's or need for noct saba. Also denies any obvious fluctuation of symptoms with weather or environmental changes or other aggravating or alleviating factors except as outlined above   No unusual exposure hx or h/o childhood pna/ asthma or knowledge of premature birth.  Current Allergies, Complete Past Medical History, Past Surgical History, Family History, and Social History were reviewed in [employer] record.  ROS  The following are not active complaints unless bolded Hoarseness, sore throat, dysphagia, dental problems, itching, sneezing,  nasal congestion or discharge of excess mucus or purulent secretions, ear ache,   fever, chills, sweats, unintended wt loss or wt gain, classically pleuritic or exertional  cp,  orthopnea pnd or arm/hand swelling  or leg swelling, presyncope, palpitations, abdominal pain, anorexia, nausea, vomiting, diarrhea  or change in bowel habits or change in bladder habits, change in stools or change in urine, dysuria, hematuria,  rash, arthralgias, visual complaints, headache, numbness, weakness or ataxia or problems with walking or coordination,  change in mood or  memory.        Current Meds  Medication Sig   albuterol (VENTOLIN HFA) 108 (90 Base) MCG/ACT inhaler INHALE 2 PUFFS BY MOUTH EVERY 6 HOURS AS NEEDED FOR WHEEZING OR SHORTNESS OF BREATH   aspirin EC 81 MG tablet Take 81 mg by mouth daily.   Budeson-Glycopyrrol-Formoterol (BREZTRI AEROSPHERE) 160-9-4.8 MCG/ACT AERO Inhale 2 puffs into the lungs 2 (two) times daily.   folic acid (FOLVITE) 1 MG tablet Take 1 tablet (1 mg total) by mouth daily.   metoprolol succinate (TOPROL-XL) 25 MG 24 hr tablet TAKE 1 TABLET EVERY DAY   rosuvastatin (CRESTOR) 20 MG tablet TAKE 1 TABLET EVERY DAY   triamcinolone ointment (KENALOG) 0.1 % APPLY ONE APPLICATION TOPICALLY TWO TIMES A DAY                     Objective:   Physical Exam    06/29/2022          190  05/18/2022        190 .06/19/2014       201     05/21/14 194 lb 6.4 oz (88.179 kg)  05/10/14 192 lb 12.8 oz (87.454 kg)  05/09/14 192 lb (87.091 kg)    Vital signs reviewed  06/29/2022  - Note at rest 02 sats  97% on 2lpm cont    General appearance:    somber amb wm, slt squeaky voice texture     HEENT : Oropharynx  clear      NECK :  without  apparent JVD/ palpable Nodes/TM    LUNGS: no acc muscle use,  Mild barrel  contour chest wall with bilateral  Distant bs s audible wheeze and  without cough on insp or exp maneuvers  and mild  Hyperresonant  to  percussion bilaterally     CV:  RRR  no s3 or murmur or increase in P2, and no edema   ABD:  soft and nontender with pos end  insp Hoover's  in the supine position.  No bruits or organomegaly  appreciated    MS:  Nl gait/ ext warm without deformities Or obvious joint restrictions  calf tenderness, cyanosis or clubbing     SKIN: warm and dry without lesions    NEURO:  alert, approp, nl sensorium with  no motor or cerebellar deficits apparent.            Assessment & Plan:

## 2022-08-19 ENCOUNTER — Other Ambulatory Visit: Payer: Self-pay | Admitting: Cardiology

## 2022-08-19 ENCOUNTER — Other Ambulatory Visit: Payer: Self-pay | Admitting: Family Medicine

## 2022-08-19 DIAGNOSIS — I25118 Atherosclerotic heart disease of native coronary artery with other forms of angina pectoris: Secondary | ICD-10-CM

## 2022-08-19 DIAGNOSIS — I10 Essential (primary) hypertension: Secondary | ICD-10-CM

## 2022-08-19 DIAGNOSIS — L409 Psoriasis, unspecified: Secondary | ICD-10-CM

## 2022-08-30 DIAGNOSIS — M25562 Pain in left knee: Secondary | ICD-10-CM | POA: Diagnosis not present

## 2022-09-07 DIAGNOSIS — S8012XA Contusion of left lower leg, initial encounter: Secondary | ICD-10-CM | POA: Diagnosis not present

## 2022-09-13 ENCOUNTER — Ambulatory Visit: Payer: Medicare HMO

## 2022-09-21 ENCOUNTER — Encounter (HOSPITAL_COMMUNITY): Payer: Medicare HMO

## 2022-09-21 ENCOUNTER — Other Ambulatory Visit (HOSPITAL_COMMUNITY): Payer: Self-pay | Admitting: Family Medicine

## 2022-09-21 ENCOUNTER — Ambulatory Visit (HOSPITAL_COMMUNITY): Payer: Medicare HMO

## 2022-09-21 ENCOUNTER — Ambulatory Visit (HOSPITAL_COMMUNITY)
Admission: RE | Admit: 2022-09-21 | Discharge: 2022-09-21 | Disposition: A | Discharge status: Home / Self Care | Payer: Medicare HMO | Source: Ambulatory Visit | Attending: Cardiovascular Disease | Admitting: Cardiovascular Disease

## 2022-09-21 DIAGNOSIS — M79661 Pain in right lower leg: Secondary | ICD-10-CM | POA: Diagnosis not present

## 2022-09-21 DIAGNOSIS — M79604 Pain in right leg: Secondary | ICD-10-CM | POA: Diagnosis not present

## 2022-09-21 DIAGNOSIS — M79662 Pain in left lower leg: Secondary | ICD-10-CM | POA: Diagnosis not present

## 2022-09-21 DIAGNOSIS — S8012XA Contusion of left lower leg, initial encounter: Secondary | ICD-10-CM | POA: Diagnosis not present

## 2022-09-22 ENCOUNTER — Encounter (HOSPITAL_COMMUNITY): Payer: Medicare HMO

## 2022-09-23 DIAGNOSIS — M79605 Pain in left leg: Secondary | ICD-10-CM | POA: Diagnosis not present

## 2022-09-24 ENCOUNTER — Telehealth: Payer: Self-pay

## 2022-09-24 NOTE — Telephone Encounter (Signed)
Deferred to you

## 2022-09-24 NOTE — Telephone Encounter (Signed)
Stacks patient  Joseph Shields @ [facility] called - patient had MRI with them and the physician recommended have PCP order an Arterial Doppler superficial femoral artery to assess aneurysm noted on the MRI. She states that it is not urgent but should be done in the next couple of weeks.

## 2022-09-27 ENCOUNTER — Telehealth: Payer: Self-pay | Admitting: Internal Medicine

## 2022-09-27 ENCOUNTER — Other Ambulatory Visit: Payer: Self-pay | Admitting: Family Medicine

## 2022-09-27 DIAGNOSIS — I724 Aneurysm of artery of lower extremity: Secondary | ICD-10-CM

## 2022-09-27 DIAGNOSIS — S83282D Other tear of lateral meniscus, current injury, left knee, subsequent encounter: Secondary | ICD-10-CM | POA: Diagnosis not present

## 2022-09-27 DIAGNOSIS — M79605 Pain in left leg: Secondary | ICD-10-CM | POA: Diagnosis not present

## 2022-09-27 DIAGNOSIS — M84352A Stress fracture, left femur, initial encounter for fracture: Secondary | ICD-10-CM | POA: Diagnosis not present

## 2022-09-27 NOTE — Telephone Encounter (Signed)
Spouse calling to check on appt for aneurysm.

## 2022-09-27 NOTE — Telephone Encounter (Signed)
Tell pt. I want him to see a vascular specialist for evaluation. (Referral written)

## 2022-09-27 NOTE — Telephone Encounter (Signed)
Called pt to get him rescheduled and pt wife expressed pt is wanting to get Oxygen removed from him and doesn't want to wait until DR. Wert next avail on 12/06/2022

## 2022-09-28 NOTE — Telephone Encounter (Signed)
Pt's wife aware of provider feedback and states she has the number for vascular to call and schedule appt.

## 2022-09-29 NOTE — Telephone Encounter (Signed)
ATC X1 LVM for patient to call the office back 

## 2022-10-01 ENCOUNTER — Ambulatory Visit: Payer: Medicare HMO | Attending: Cardiology | Admitting: Cardiology

## 2022-10-01 ENCOUNTER — Encounter: Payer: Self-pay | Admitting: Cardiology

## 2022-10-01 VITALS — BP 138/80 | HR 65 | Ht 68.0 in | Wt 190.8 lb

## 2022-10-01 DIAGNOSIS — I2510 Atherosclerotic heart disease of native coronary artery without angina pectoris: Secondary | ICD-10-CM

## 2022-10-01 DIAGNOSIS — R600 Localized edema: Secondary | ICD-10-CM

## 2022-10-01 DIAGNOSIS — I10 Essential (primary) hypertension: Secondary | ICD-10-CM | POA: Diagnosis not present

## 2022-10-01 DIAGNOSIS — E785 Hyperlipidemia, unspecified: Secondary | ICD-10-CM | POA: Diagnosis not present

## 2022-10-01 DIAGNOSIS — I724 Aneurysm of artery of lower extremity: Secondary | ICD-10-CM

## 2022-10-01 DIAGNOSIS — I5032 Chronic diastolic (congestive) heart failure: Secondary | ICD-10-CM

## 2022-10-01 NOTE — Addendum Note (Signed)
Addended by: Luellen Pucker on: 10/01/2022 02:56 PM   Modules accepted: Orders

## 2022-10-01 NOTE — Progress Notes (Signed)
Cardiology Office Note:    Date:  10/01/2022   ID:  Joseph Alstrom., DOB Jan 03, 1946, MRN [id number]  PCP:  Mechele Claude, MD  Cardiologist:  Armanda Magic, MD    Referring MD: Mechele Claude, MD   Chief Complaint  Patient presents with   Coronary Artery Disease   Hypertension   Hyperlipidemia   Congestive Heart Failure    History of Present Illness:    Joseph Scull. is a 77 y.o. 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  He also has a history of hyperlipidemia and COPD.  He was started on lipitor and stopped it due to muscle aches and remains on Crestor.  He also stopped coreg because he said it made his legs swell and he is now on metoprolol.    Since I saw him last he was hospitalized at [facility] in January for acute hypoxemic respiratory failure related to the exacerbation.  He also had acute diastolic CHF as well.  He was treated with IV Lasix.  2D echo in the hospital showed EF 70% and chest CT showed no  PE.  He is on home O2 at 3 L.  He also started having problems with nosebleeds and his aspirin was stopped.    He was seen back by Jari Favre, NP after his discharge and was doing well.  His shortness of breath had significantly improved and he was no longer on diuretic therapy.  He is followed by pulmonary for COPD.  He recently was seen by [facility] after a fall on a wet floor and had weakness and limited mobility in the knee with swelling in the lower leg.  He had an MRI of his left lower extremity done which revealed a high grade posterior horn-posterior body junction lateral meniscus tear, nondisplaced lateral femoral condyle fx and anteromedial tibial plateau impaction fracture.  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  He was referred to [facility] but cannot get in to  get an appt and is very frustrated.   He is here today for followup and is doing well.  He denies any chest pain or pressure, SOB, DOE, PND, orthopnea, dizziness, palpitations or syncope. He has had some LE edema in his left leg since his fall but LE venous dopplers showed no DVT but did shows some edema in the left calf with moderate suprapatellar fluid from his fall. He is compliant with his meds and is tolerating meds with no SE.    Past Medical History:  Diagnosis Date   CAD (coronary artery disease)    a. 03/2014 NSTEMI - initially refused cath;  b. 03/2014 Echo: EF 55-60%;  c. 04/2014 Cath: LM nl, LAD 20-30p, 57m, D1 30-40, D2 small, 60-70, LCX nl, RCA 100p (CTO), L->R collaterals, EF 55-60%-->Med Rx.   COPD (chronic obstructive pulmonary disease) (HCC)    History of tobacco abuse    a. 40+ pack years, quit 03/2014.   Hyperlipidemia    Morbid obesity (HCC)    Psoriasis     Past Surgical History:  Procedure Laterality Date   CATARACT EXTRACTION, BILATERAL     LEFT HEART CATHETERIZATION WITH CORONARY ANGIOGRAM N/A 05/06/2014   Procedure: LEFT HEART CATHETERIZATION WITH CORONARY ANGIOGRAM;  Surgeon: Marykay Lex, MD;  Location: [facility] CATH LAB;  Service: Cardiovascular;  Laterality: N/A;   SPINE SURGERY      Current Medications: Current Meds  Medication Sig   albuterol (VENTOLIN HFA) 108 (90 Base) MCG/ACT inhaler INHALE 2 PUFFS BY MOUTH EVERY 6 HOURS AS NEEDED FOR WHEEZING OR SHORTNESS OF BREATH   Budeson-Glycopyrrol-Formoterol (BREZTRI AEROSPHERE) 160-9-4.8 MCG/ACT AERO Inhale 2 puffs into the lungs 2 (two) times daily.   folic acid (FOLVITE) 1 MG tablet TAKE 1 TABLET EVERY DAY   meloxicam (MOBIC) 15 MG tablet Take 15 mg by mouth daily.   metoprolol succinate (TOPROL-XL) 25 MG 24 hr tablet TAKE 1 TABLET EVERY DAY   rosuvastatin (CRESTOR) 20 MG tablet TAKE 1 TABLET EVERY DAY   triamcinolone ointment (KENALOG) 0.1 % APPLY ONE APPLICATION TOPICALLY TWO TIMES A DAY     Allergies:    Flonase [fluticasone propionate] and Penicillins   Social History   Socioeconomic History   Marital status: Married    Spouse name: Joseph Shields   Number of children: 2   Years of education: associates degree   Highest education level: Associate degree: occupational, Scientist, product/process development, or vocational program  Occupational History   Occupation: Retired    Comment: Wellsite geologist  Tobacco Use   Smoking status: Former    Packs/day: 0.50    Years: 49.00    Additional pack years: 0.00    Total pack years: 24.50    Types: Cigarettes    Quit date: 04/07/2014    Years since quitting: 8.4   Smokeless tobacco: Never  Vaping Use   Vaping Use: Never used  Substance and Sexual Activity   Alcohol use: No    Alcohol/week: 0.0 standard drinks of alcohol   Drug use: No   Sexual activity: Yes  Other Topics Concern   Not on file  Social History Narrative   Lives home with wife. Children live nearby   Social Determinants of Health   Financial Resource Strain: Low Risk  (06/16/2022)   Overall Financial Resource Strain (CARDIA)    Difficulty of Paying Living Expenses: Not hard at all  Food Insecurity: No Food Insecurity (06/16/2022)   Hunger Vital Sign    Worried About Running Out of Food in the Last Year: Never true    Ran Out of Food in the Last Year: Never true  Transportation Needs: No Transportation Needs (06/16/2022)   PRAPARE - Administrator, Civil Service (Medical): No    Lack of Transportation (Non-Medical): No  Physical Activity: Inactive (06/16/2022)   Exercise Vital Sign    Days of Exercise per Week: 0 days    Minutes of Exercise per Session: 0 min  Stress: No Stress Concern Present (06/16/2022)   [employer] of Occupational Health - Occupational Stress Questionnaire    Feeling of Stress : Not at all  Social Connections: Moderately Integrated (06/16/2022)   Social Connection and Isolation Panel [NHANES]    Frequency of Communication with Friends and Family:  More than three times a week    Frequency of Social Gatherings with Friends and Family: More than three times a week    Attends Religious Services: More than 4 times per year    Active Member of [employer] or Organizations: No    Attends Engineer, structural: Never    Marital Status: Married     Family History: The patient's family history includes Allergies in his mother; Asthma in his mother; Cancer in his mother; Healthy in his daughter and daughter; Heart disease (age of onset: 73) in  his father; Stroke (age of onset: 45) in his mother.  ROS:   Please see the history of present illness.    ROS  All other systems reviewed and negative.   EKGs/Labs/Other Studies Reviewed:    The following studies were reviewed today: none  EKG:  EKG is not ordered today.  T  Recent Labs: 03/15/2022: Hemoglobin 14.1; Platelets 161 05/11/2022: ALT 28; BNP 49.3; BUN 19; Creatinine, Ser 1.05; Potassium 4.9; Sodium 139   Recent Lipid Panel    Component Value Date/Time   CHOL 124 03/15/2022 1134   TRIG 82 03/15/2022 1134   HDL 49 03/15/2022 1134   CHOLHDL 2.5 03/15/2022 1134   CHOLHDL 2.6 09/29/2015 0914   VLDL 15 09/29/2015 0914   LDLCALC 59 03/15/2022 1134   LDLDIRECT 71 05/10/2016 1639    Physical Exam:    VS:  BP 138/80   Pulse 65   Ht 5\' 8"  (1.727 m)   Wt 190 lb 12.8 oz (86.5 kg)   SpO2 95%   BMI 29.01 kg/m     Wt Readings from Last 3 Encounters:  10/01/22 190 lb 12.8 oz (86.5 kg)  06/29/22 190 lb (86.2 kg)  06/16/22 190 lb (86.2 kg)     GEN: Well nourished, well developed in no acute distress HEENT: Normal NECK: No JVD; No carotid bruits LYMPHATICS: No lymphadenopathy CARDIAC:RRR, no murmurs, rubs, gallops RESPIRATORY:  Clear to auscultation without rales, wheezing or rhonchi  ABDOMEN: Soft, non-tender, non-distended MUSCULOSKELETAL:  No edema; No deformity  SKIN: Warm and dry NEUROLOGIC:  Alert and oriented x 3 PSYCHIATRIC:  Normal affect  ASSESSMENT:    1.  Coronary artery disease involving native coronary artery of native heart without angina pectoris   2. Hyperlipidemia LDL goal <70   3. Essential hypertension   4. Chronic diastolic CHF (congestive heart failure) (HCC)   5. Aneurysm of left femoral artery (HCC)   6. Unilateral edema of lower extremity    PLAN:    In order of problems listed above:  1.  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  -He denies any angina since I saw him last -Continue prescription drug management with aspirin 81 mg daily, Toprol-XL 25 mg daily and Crestor 20 mg daily with as needed refills  2.  HLD -LDL goal < 70 -I have personally reviewed and interpreted outside labs performed by patient's PCP which showed LDL 59 and HDL 49 on 03/15/2022 with normal ALT 28 on 05/11/2022 -Continue prescription drug with Crestor 20 mg daily with as needed refills  3.  HTN -BP is adequately controlled on exam today -Continue prescription drug management with Toprol-XL 25 mg daily with as needed refills  4.  Chronic  diastolic CHF -This occurred in the setting of acute hypoxemic respiratory failure from COPD exacerbation in which she had some mild volume overload which was treated with IV Lasix -2D echo at [facility] in January 2024 showed hyperdynamic LV function with EF greater than 70%. -He appears euvolemic on exam today and is currently not needed any Lasix  5.  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  Left  LE edema -related to recent fall with injuries stated by MRI in the HPI -LE venous dopplers showed no evidence of DVT  Medication Adjustments/Labs and  Tests Ordered: Current medicines are reviewed at length with the patient today.  Concerns regarding medicines are outlined above.  No orders of the defined types were placed in this encounter.  No orders of the defined types were placed in this encounter.   Signed, Armanda Magic, MD  10/01/2022 2:47 PM    [facility]

## 2022-10-01 NOTE — Patient Instructions (Addendum)
Medication Instructions:  Your physician recommends that you continue on your current medications as directed. Please refer to the Current Medication list given to you today.  *If you need a refill on your cardiac medications before your next appointment, please call your pharmacy*   Lab Work: None.  If you have labs (blood work) drawn today and your tests are completely normal, you will receive your results only by: MyChart Message (if you have MyChart) OR A paper copy in the mail If you have any lab test that is abnormal or we need to change your treatment, we will call you to review the results.   Testing/Procedures: None.   Follow-Up: At [facility], you and your health needs are our priority.  As part of our continuing mission to provide you with exceptional heart care, we have created designated Provider Care Teams.  These Care Teams include your primary Cardiologist (physician) and Advanced Practice Providers (APPs -  Physician Assistants and Nurse Practitioners) who all work together to provide you with the care you need, when you need it.  We recommend signing up for the patient portal called "MyChart".  Sign up information is provided on this After Visit Summary.  MyChart is used to connect with patients for Virtual Visits (Telemedicine).  Patients are able to view lab/test results, encounter notes, upcoming appointments, etc.  Non-urgent messages can be sent to your provider as well.   To learn more about what you can do with MyChart, go to [email or web address].    Your next appointment:   1 year(s)  Provider:   Armanda Magic, MD     Other Instructions You have been referred to Dr. Sherald Hess for your femoral artery aneurysm.   [address], [place], [place] [address] Phone: [phone]

## 2022-10-01 NOTE — Telephone Encounter (Signed)
Shaunda called from [facility] stating that they need the MRI Results faxed to them at [phone].

## 2022-10-04 ENCOUNTER — Ambulatory Visit: Payer: Medicare HMO | Admitting: Internal Medicine

## 2022-10-07 DIAGNOSIS — M25562 Pain in left knee: Secondary | ICD-10-CM | POA: Diagnosis not present

## 2022-10-07 DIAGNOSIS — S82125A Nondisplaced fracture of lateral condyle of left tibia, initial encounter for closed fracture: Secondary | ICD-10-CM | POA: Diagnosis not present

## 2022-10-07 DIAGNOSIS — I724 Aneurysm of artery of lower extremity: Secondary | ICD-10-CM | POA: Diagnosis not present

## 2022-10-11 ENCOUNTER — Telehealth: Payer: Self-pay | Admitting: Cardiology

## 2022-10-11 NOTE — Telephone Encounter (Signed)
Called [facility]. They are waiting on getting records from patient's PCP's office. They stated that patient had been contacted today to let him know of the delay and they will call him either tomorrow or the next day to get him schedule. Will send message to our [facility] pool to make sure all our records have been sent. Called patient to let him know of update.

## 2022-10-11 NOTE — Telephone Encounter (Signed)
Patient is calling stating [facility] has not scheduled him for an appt. He states he called them last Thursday and they advised him they would expedite it, but he has still not heard back. Patient reports Dr. Mayford Knife advised him to call our office if he didn't get an appt within a week. Please advise.

## 2022-10-12 ENCOUNTER — Ambulatory Visit: Payer: Medicare HMO | Admitting: Family Medicine

## 2022-10-13 ENCOUNTER — Ambulatory Visit (INDEPENDENT_AMBULATORY_CARE_PROVIDER_SITE_OTHER): Payer: Medicare HMO | Admitting: Family Medicine

## 2022-10-13 ENCOUNTER — Encounter: Payer: Self-pay | Admitting: Family Medicine

## 2022-10-13 VITALS — BP 122/63 | HR 65 | Temp 98.1°F | Ht 68.0 in | Wt 185.4 lb

## 2022-10-13 DIAGNOSIS — I10 Essential (primary) hypertension: Secondary | ICD-10-CM

## 2022-10-13 DIAGNOSIS — I724 Aneurysm of artery of lower extremity: Secondary | ICD-10-CM | POA: Diagnosis not present

## 2022-10-13 DIAGNOSIS — L409 Psoriasis, unspecified: Secondary | ICD-10-CM | POA: Diagnosis not present

## 2022-10-13 DIAGNOSIS — J9611 Chronic respiratory failure with hypoxia: Secondary | ICD-10-CM | POA: Diagnosis not present

## 2022-10-13 DIAGNOSIS — J449 Chronic obstructive pulmonary disease, unspecified: Secondary | ICD-10-CM

## 2022-10-13 DIAGNOSIS — I25118 Atherosclerotic heart disease of native coronary artery with other forms of angina pectoris: Secondary | ICD-10-CM

## 2022-10-13 DIAGNOSIS — E782 Mixed hyperlipidemia: Secondary | ICD-10-CM | POA: Diagnosis not present

## 2022-10-13 MED ORDER — ALBUTEROL SULFATE HFA 108 (90 BASE) MCG/ACT IN AERS
INHALATION_SPRAY | RESPIRATORY_TRACT | 0 refills | Status: AC
Start: 2022-10-13 — End: ?

## 2022-10-13 MED ORDER — MELOXICAM 15 MG PO TABS: 15.0000 mg | ORAL_TABLET | Freq: Every day | ORAL | Status: DC

## 2022-10-13 MED ORDER — BREZTRI AEROSPHERE 160-9-4.8 MCG/ACT IN AERO: 2.0000 | INHALATION_SPRAY | Freq: Two times a day (BID) | RESPIRATORY_TRACT | 3 refills | Status: DC

## 2022-10-13 MED ORDER — LINEZOLID 600 MG PO TABS
600.0000 mg | ORAL_TABLET | Freq: Two times a day (BID) | ORAL | 0 refills | Status: DC
Start: 2022-10-13 — End: 2022-10-13

## 2022-10-13 MED ORDER — ROSUVASTATIN CALCIUM 20 MG PO TABS: 20.0000 mg | ORAL_TABLET | Freq: Every day | ORAL | 3 refills | Status: DC

## 2022-10-13 MED ORDER — FOLIC ACID 1 MG PO TABS: 1.0000 mg | ORAL_TABLET | Freq: Every day | ORAL | 2 refills | Status: DC

## 2022-10-13 MED ORDER — METOPROLOL SUCCINATE ER 25 MG PO TB24: 25.0000 mg | ORAL_TABLET | Freq: Every day | ORAL | 3 refills | Status: DC

## 2022-10-13 NOTE — Progress Notes (Signed)
Subjective:  Patient ID: Joseph Shields., male    DOB: 04/10/1946  Age: 77 y.o. MRN: [id number]  CC: Medical Management of Chronic Issues   HPI Joseph Shields. presents for Pt. LLE superficial femoral artery aneurysm.  It measured 2.5 cm on MRI. It is located 4 cm proximal to the posterior left knee joint. Pt. Had injury from a fall. MRI of knee done by [facility] and not available for review. However, We were asked to refer to vascular due to the presence of the aneurysm. Pt. Emotes today based on concerns that the referral has not been completed and he still has no idea how dangerous the aneurysm might be.   COPD stable. Using inhalers and night time O2.  Denies dyspnea currently.    presents for  follow-up of hypertension. Patient has no history of headache chest pain or shortness of breath or recent cough. Patient also denies symptoms of TIA such as focal numbness or weakness. Patient denies side effects from medication. States taking it regularly.   in for follow-up of elevated cholesterol. Doing well without complaints on current medication. Denies side effects of statin including myalgia and arthralgia and nausea. Currently no chest pain, shortness of breath or other cardiovascular related symptoms noted.      10/13/2022    2:34 PM 06/16/2022   12:08 PM 04/05/2022    2:38 PM  Depression screen PHQ 2/9  Decreased Interest 0 0 0  Down, Depressed, Hopeless 0 0 0  PHQ - 2 Score 0 0 0  Altered sleeping   0  Tired, decreased energy   0  Change in appetite   0  Feeling bad or failure about yourself    0  Trouble concentrating   0  Moving slowly or fidgety/restless   0  Suicidal thoughts   0  PHQ-9 Score   0  Difficult doing work/chores   Not difficult at all    History Zakye has a past medical history of CAD (coronary artery disease), COPD (chronic obstructive pulmonary disease) (HCC), History of tobacco abuse, Hyperlipidemia, Morbid obesity (HCC), and Psoriasis.   He  has a past surgical history that includes Spine surgery; left heart catheterization with coronary angiogram (N/A, 05/06/2014); and Cataract extraction, bilateral.   His family history includes Allergies in his mother; Asthma in his mother; Cancer in his mother; Healthy in his daughter and daughter; Heart disease (age of onset: 7) in his father; Stroke (age of onset: 31) in his mother.He reports that he quit smoking about 8 years ago. His smoking use included cigarettes. He has a 24.50 pack-year smoking history. He has never used smokeless tobacco. He reports that he does not drink alcohol and does not use drugs.    ROS Review of Systems  Constitutional:  Negative for fever.  Respiratory:  Negative for shortness of breath.   Cardiovascular:  Negative for chest pain.  Musculoskeletal:  Negative for arthralgias.  Skin:  Negative for rash.    Objective:  BP 122/63   Pulse 65   Temp 98.1 F (36.7 C)   Ht 5\' 8"  (1.727 m)   Wt 185 lb 6.4 oz (84.1 kg)   SpO2 95%   BMI 28.19 kg/m   BP Readings from Last 3 Encounters:  10/13/22 122/63  10/01/22 138/80  06/29/22 126/60    Wt Readings from Last 3 Encounters:  10/13/22 185 lb 6.4 oz (84.1 kg)  10/01/22 190 lb 12.8 oz (86.5 kg)  06/29/22 190  lb (86.2 kg)     Physical Exam Vitals reviewed.  Constitutional:      Appearance: He is well-developed.  HENT:     Head: Normocephalic and atraumatic.     Right Ear: External ear normal.     Left Ear: External ear normal.     Mouth/Throat:     Pharynx: No oropharyngeal exudate or posterior oropharyngeal erythema.  Eyes:     Pupils: Pupils are equal, round, and reactive to light.  Cardiovascular:     Rate and Rhythm: Normal rate and regular rhythm.     Heart sounds: No murmur heard. Pulmonary:     Effort: No respiratory distress.     Breath sounds: Normal breath sounds.  Musculoskeletal:     Cervical back: Normal range of motion and neck supple.  Neurological:     Mental Status: He  is alert and oriented to person, place, and time.       Assessment & Plan:   [name] was seen today for medical management of chronic issues.  Diagnoses and all orders for this visit:  Aneurysm of left femoral artery (HCC)  Essential hypertension -     metoprolol succinate (TOPROL-XL) 25 MG 24 hr tablet; Take 1 tablet (25 mg total) by mouth daily.  Mixed hyperlipidemia -     rosuvastatin (CRESTOR) 20 MG tablet; Take 1 tablet (20 mg total) by mouth daily.  Chronic respiratory failure with hypoxia (HCC) -     albuterol (VENTOLIN HFA) 108 (90 Base) MCG/ACT inhaler; INHALE 2 PUFFS BY MOUTH EVERY 6 HOURS AS NEEDED FOR WHEEZING OR SHORTNESS OF BREATH -     Budeson-Glycopyrrol-Formoterol (BREZTRI AEROSPHERE) 160-9-4.8 MCG/ACT AERO; Inhale 2 puffs into the lungs 2 (two) times daily.  Psoriasis -     folic acid (FOLVITE) 1 MG tablet; Take 1 tablet (1 mg total) by mouth daily.  Coronary artery disease of native artery of native heart with stable angina pectoris (HCC) -     metoprolol succinate (TOPROL-XL) 25 MG 24 hr tablet; Take 1 tablet (25 mg total) by mouth daily.  End stage COPD (HCC) -     albuterol (VENTOLIN HFA) 108 (90 Base) MCG/ACT inhaler; INHALE 2 PUFFS BY MOUTH EVERY 6 HOURS AS NEEDED FOR WHEEZING OR SHORTNESS OF BREATH -     Budeson-Glycopyrrol-Formoterol (BREZTRI AEROSPHERE) 160-9-4.8 MCG/ACT AERO; Inhale 2 puffs into the lungs 2 (two) times daily.  Other orders -     Discontinue: linezolid (ZYVOX) 600 MG tablet; Take 1 tablet (600 mg total) by mouth 2 (two) times daily. -     Cancel: Ambulatory referral to Hand Surgery -     meloxicam (MOBIC) 15 MG tablet; Take 1 tablet (15 mg total) by mouth daily.   Lengthy discussion of aneurysm referral. Pt. Advised to get copy of MRI and take it to vascular office in order to get appt. Scheduled.     I have discontinued Chrissie Noa L. Fleece Montez Hageman. "W.L."'s linezolid. I have also changed his folic acid, meloxicam, metoprolol succinate,  and rosuvastatin. Additionally, I am having him maintain his aspirin EC, triamcinolone ointment, albuterol, and Breztri Aerosphere.  Allergies as of 10/13/2022       Reactions   Flonase [fluticasone Propionate] Other (See Comments)   Nose bleeds.   Penicillins Other (See Comments)   REACTION: unknown        Medication List        Accurate as of October 13, 2022 10:18 PM. If you have any questions, ask your nurse  or doctor.          albuterol 108 (90 Base) MCG/ACT inhaler Commonly known as: VENTOLIN HFA INHALE 2 PUFFS BY MOUTH EVERY 6 HOURS AS NEEDED FOR WHEEZING OR SHORTNESS OF BREATH   aspirin EC 81 MG tablet Take 81 mg by mouth daily.   Breztri Aerosphere 160-9-4.8 MCG/ACT Aero Generic drug: Budeson-Glycopyrrol-Formoterol Inhale 2 puffs into the lungs 2 (two) times daily.   folic acid 1 MG tablet Commonly known as: FOLVITE Take 1 tablet (1 mg total) by mouth daily.   meloxicam 15 MG tablet Commonly known as: MOBIC Take 1 tablet (15 mg total) by mouth daily.   metoprolol succinate 25 MG 24 hr tablet Commonly known as: TOPROL-XL Take 1 tablet (25 mg total) by mouth daily.   rosuvastatin 20 MG tablet Commonly known as: CRESTOR Take 1 tablet (20 mg total) by mouth daily.   triamcinolone ointment 0.1 % Commonly known as: KENALOG APPLY ONE APPLICATION TOPICALLY TWO TIMES A DAY         Follow-up: Return in about 6 months (around 04/14/2023), or if symptoms worsen or fail to improve.  Mechele Claude, M.D.

## 2022-10-13 NOTE — Progress Notes (Deleted)
Subjective:  Patient ID: Joseph Shields., male    DOB: 08-May-1945  Age: 77 y.o. MRN: [id number]  CC: Medical Management of Chronic Issues   HPI [name]. presents for     10/13/2022    2:34 PM 06/16/2022   12:08 PM 04/05/2022    2:38 PM  Depression screen PHQ 2/9  Decreased Interest 0 0 0  Down, Depressed, Hopeless 0 0 0  PHQ - 2 Score 0 0 0  Altered sleeping   0  Tired, decreased energy   0  Change in appetite   0  Feeling bad or failure about yourself    0  Trouble concentrating   0  Moving slowly or fidgety/restless   0  Suicidal thoughts   0  PHQ-9 Score   0  Difficult doing work/chores   Not difficult at all    History Joseph Shields has a past medical history of CAD (coronary artery disease), COPD (chronic obstructive pulmonary disease) (HCC), History of tobacco abuse, Hyperlipidemia, Morbid obesity (HCC), and Psoriasis.   He has a past surgical history that includes Spine surgery; left heart catheterization with coronary angiogram (N/A, 05/06/2014); and Cataract extraction, bilateral.   His family history includes Allergies in his mother; Asthma in his mother; Cancer in his mother; Healthy in his daughter and daughter; Heart disease (age of onset: 64) in his father; Stroke (age of onset: 58) in his mother.He reports that he quit smoking about 8 years ago. His smoking use included cigarettes. He has a 24.50 pack-year smoking history. He has never used smokeless tobacco. He reports that he does not drink alcohol and does not use drugs.    ROS Review of Systems  Constitutional:  Negative for fever.  HENT: Negative.    Respiratory:  Negative for shortness of breath.   Cardiovascular:  Negative for chest pain.  Musculoskeletal:  Negative for arthralgias.  Skin:  Negative for rash.    Objective:  BP 122/63   Pulse 65   Temp 98.1 F (36.7 C)   Ht 5\' 8"  (1.727 m)   Wt 185 lb 6.4 oz (84.1 kg)   SpO2 95%   BMI 28.19 kg/m   BP Readings from Last 3  Encounters:  10/13/22 122/63  10/01/22 138/80  06/29/22 126/60    Wt Readings from Last 3 Encounters:  10/13/22 185 lb 6.4 oz (84.1 kg)  10/01/22 190 lb 12.8 oz (86.5 kg)  06/29/22 190 lb (86.2 kg)     Physical Exam Vitals reviewed.  Constitutional:      Appearance: He is well-developed.  HENT:     Head: Normocephalic and atraumatic.     Right Ear: External ear normal.     Left Ear: External ear normal.     Mouth/Throat:     Pharynx: No oropharyngeal exudate or posterior oropharyngeal erythema.  Eyes:     Pupils: Pupils are equal, round, and reactive to light.  Cardiovascular:     Rate and Rhythm: Normal rate and regular rhythm.     Heart sounds: No murmur heard. Pulmonary:     Effort: No respiratory distress.     Breath sounds: Normal breath sounds.  Musculoskeletal:     Cervical back: Normal range of motion and neck supple.  Skin:    Findings: Lesion present.  Neurological:     Mental Status: He is alert and oriented to person, place, and time.       Assessment & Plan:  I am having Keenon L. Evinger Montez Hageman. "W.L." start on linezolid. I am also having him maintain his aspirin EC, rosuvastatin, albuterol, triamcinolone ointment, Breztri Aerosphere, folic acid, metoprolol succinate, and meloxicam.  Allergies as of 10/13/2022       Reactions   Flonase [fluticasone Propionate] Other (See Comments)   Nose bleeds.   Penicillins Other (See Comments)   REACTION: unknown        Medication List        Accurate as of October 13, 2022  3:42 PM. If you have any questions, ask your nurse or doctor.          albuterol 108 (90 Base) MCG/ACT inhaler Commonly known as: VENTOLIN HFA INHALE 2 PUFFS BY MOUTH EVERY 6 HOURS AS NEEDED FOR WHEEZING OR SHORTNESS OF BREATH   aspirin EC 81 MG tablet Take 81 mg by mouth daily.   Breztri Aerosphere 160-9-4.8 MCG/ACT Aero Generic drug: Budeson-Glycopyrrol-Formoterol Inhale 2 puffs into the lungs 2 (two) times daily.    folic acid 1 MG tablet Commonly known as: FOLVITE TAKE 1 TABLET EVERY DAY   linezolid 600 MG tablet Commonly known as: Zyvox Take 1 tablet (600 mg total) by mouth 2 (two) times daily. Started by: Mechele Claude, MD   meloxicam 15 MG tablet Commonly known as: MOBIC Take 15 mg by mouth daily.   metoprolol succinate 25 MG 24 hr tablet Commonly known as: TOPROL-XL TAKE 1 TABLET EVERY DAY   rosuvastatin 20 MG tablet Commonly known as: CRESTOR TAKE 1 TABLET EVERY DAY   triamcinolone ointment 0.1 % Commonly known as: KENALOG APPLY ONE APPLICATION TOPICALLY TWO TIMES A DAY         Follow-up: No follow-ups on file.  Mechele Claude, M.D.

## 2022-10-15 ENCOUNTER — Ambulatory Visit: Payer: Medicare HMO | Admitting: Surgery

## 2022-10-15 ENCOUNTER — Encounter: Payer: Self-pay | Admitting: Surgery

## 2022-10-15 ENCOUNTER — Other Ambulatory Visit: Payer: Self-pay

## 2022-10-15 VITALS — BP 153/74 | HR 61 | Temp 98.2°F | Resp 16 | Ht 68.0 in | Wt 186.0 lb

## 2022-10-15 DIAGNOSIS — I724 Aneurysm of artery of lower extremity: Secondary | ICD-10-CM

## 2022-10-15 NOTE — Progress Notes (Signed)
[facility] of   Patient name: Joseph Shields. MRN: [id number] DOB: 05-30-1945 Sex: male   REQUESTING PROVIDER:    Dr. Mayford Knife   REASON FOR CONSULT:    Femoral aneurysm  HISTORY OF PRESENT ILLNESS:   Joseph Shields. is a 77 y.o. 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  This was discovered on a MRI of his left knee.  Maximum diameter is 2.5 cm measured by MRI.  It is located 4 cm proximal to the posterior left knee joint this was images when looking for further evaluation of left knee pain from a fall approximately a month ago.  He is having lateral knee pain  Patient has a history of coronary artery disease.  He is status post NSTEMI in 2015.  He has a history of COPD from former tobacco abuse.  He is on a statin for hypercholesterolemia.  PAST MEDICAL HISTORY    Past Medical History:  Diagnosis Date   CAD (coronary artery disease)    a. 03/2014 NSTEMI - initially refused cath;  b. 03/2014 Echo: EF 55-60%;  c. 04/2014 Cath: LM nl, LAD 20-30p, 5m, D1 30-40, D2 small, 60-70, LCX nl, RCA 100p (CTO), L->R collaterals, EF 55-60%-->Med Rx.   COPD (chronic obstructive pulmonary disease) (HCC)    History of tobacco abuse    a. 40+ pack years, quit 03/2014.   Hyperlipidemia    Morbid obesity (HCC)    Psoriasis      FAMILY HISTORY   Family History  Problem Relation Age of Onset   Stroke Mother 4   Allergies Mother    Asthma Mother    Cancer Mother        colon   Heart disease Father 46       quadruple bypass   Healthy Daughter    Healthy Daughter     SOCIAL HISTORY:   Social History   Socioeconomic History   Marital status: Married    Spouse name: Joseph Shields   Number of children: 2   Years of education: associates degree   Highest education level: Associate degree: occupational, Scientist, product/process development, or vocational program  Occupational History   Occupation: Retired    Comment:  Wellsite geologist  Tobacco Use   Smoking status: Former    Packs/day: 0.50    Years: 49.00    Additional pack years: 0.00    Total pack years: 24.50    Types: Cigarettes    Quit date: 04/07/2014    Years since quitting: 8.5   Smokeless tobacco: Never  Vaping Use   Vaping Use: Never used  Substance and Sexual Activity   Alcohol use: No    Alcohol/week: 0.0 standard drinks of alcohol   Drug use: No   Sexual activity: Yes  Other Topics Concern   Not on file  Social History Narrative   Lives home with wife. Children live nearby   Social Determinants of Health   Financial Resource Strain: Low Risk  (06/16/2022)   Overall Financial Resource Strain (CARDIA)    Difficulty of Paying Living Expenses: Not hard at all  Food Insecurity: No Food Insecurity (06/16/2022)   Hunger Vital Sign    Worried About Running Out of Food in the Last Year: Never true    Ran Out of Food in the Last Year: Never true  Transportation Needs: No Transportation Needs (06/16/2022)   PRAPARE - Administrator, Civil Service (Medical): No  Lack of Transportation (Non-Medical): No  Physical Activity: Inactive (06/16/2022)   Exercise Vital Sign    Days of Exercise per Week: 0 days    Minutes of Exercise per Session: 0 min  Stress: No Stress Concern Present (06/16/2022)   [employer] of Occupational Health - Occupational Stress Questionnaire    Feeling of Stress : Not at all  Social Connections: Moderately Integrated (06/16/2022)   Social Connection and Isolation Panel [NHANES]    Frequency of Communication with Friends and Family: More than three times a week    Frequency of Social Gatherings with Friends and Family: More than three times a week    Attends Religious Services: More than 4 times per year    Active Member of [employer] or Organizations: No    Attends Banker Meetings: Never    Marital Status: Married  Catering manager Violence: Not At Risk (06/16/2022)    Humiliation, Afraid, Rape, and Kick questionnaire    Fear of Current or Ex-Partner: No    Emotionally Abused: No    Physically Abused: No    Sexually Abused: No    ALLERGIES:    Allergies  Allergen Reactions   Flonase [Fluticasone Propionate] Other (See Comments)    Nose bleeds.   Penicillins Other (See Comments)    REACTION: unknown    CURRENT MEDICATIONS:    Current Outpatient Medications  Medication Sig Dispense Refill   albuterol (VENTOLIN HFA) 108 (90 Base) MCG/ACT inhaler INHALE 2 PUFFS BY MOUTH EVERY 6 HOURS AS NEEDED FOR WHEEZING OR SHORTNESS OF BREATH 18 g 0   aspirin EC 81 MG tablet Take 81 mg by mouth daily.     Budeson-Glycopyrrol-Formoterol (BREZTRI AEROSPHERE) 160-9-4.8 MCG/ACT AERO Inhale 2 puffs into the lungs 2 (two) times daily. 32.1 g 3   folic acid (FOLVITE) 1 MG tablet Take 1 tablet (1 mg total) by mouth daily. 90 tablet 2   meloxicam (MOBIC) 15 MG tablet Take 1 tablet (15 mg total) by mouth daily.     metoprolol succinate (TOPROL-XL) 25 MG 24 hr tablet Take 1 tablet (25 mg total) by mouth daily. 90 tablet 3   rosuvastatin (CRESTOR) 20 MG tablet Take 1 tablet (20 mg total) by mouth daily. 90 tablet 3   triamcinolone ointment (KENALOG) 0.1 % APPLY ONE APPLICATION TOPICALLY TWO TIMES A DAY 454 g 3   No current facility-administered medications for this visit.    REVIEW OF SYSTEMS:   [X]  denotes positive finding, [ ]  denotes negative finding Cardiac  Comments:  Chest pain or chest pressure:    Shortness of breath upon exertion:    Short of breath when lying flat:    Irregular heart rhythm:        Vascular    Pain in calf, thigh, or hip brought on by ambulation:    Pain in feet at night that wakes you up from your sleep:     Blood clot in your veins:    Leg swelling:         Pulmonary    Oxygen at home:    Productive cough:     Wheezing:         Neurologic    Sudden weakness in arms or legs:     Sudden numbness in arms or legs:     Sudden  onset of difficulty speaking or slurred speech:    Temporary loss of vision in one eye:     Problems with dizziness:  Gastrointestinal    Blood in stool:      Vomited blood:         Genitourinary    Burning when urinating:     Blood in urine:        Psychiatric    Major depression:         Hematologic    Bleeding problems:    Problems with blood clotting too easily:        Skin    Rashes or ulcers:        Constitutional    Fever or chills:     PHYSICAL EXAM:   Vitals:   10/15/22 1222  BP: (!) 153/74  Pulse: 61  Resp: 16  Temp: 98.2 F (36.8 C)  TempSrc: Temporal  SpO2: 95%  Weight: 186 lb (84.4 kg)  Height: 5\' 8"  (1.727 m)    GENERAL: The patient is a well-nourished male, in no acute distress. The vital signs are documented above. CARDIAC: There is a regular rate and rhythm.  VASCULAR: Palpable pedal pulses bilaterally PULMONARY: Nonlabored respirations ABDOMEN: Soft nontender MUSCULOSKELETAL: There are no major deformities or cyanosis. NEUROLOGIC: No focal weakness or paresthesias are detected. SKIN: There are no ulcers or rashes noted. PSYCHIATRIC: The patient has a normal affect.  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  I do not have a copy of the images to review.  In addition, I do not think MRI is the best modality to fully evaluate aneurysmal disease.  I would like to first began by getting a CT scan ( CT angio A/O bifemoral) to better evaluate his popliteal aneurysm and to determine whether or not he is a candidate for endovascular repair versus open surgical treatment.  Specifically I need to evaluate his abdominal aorta and bifurcation to see if I can get a large sheath over the bifurcation.  I also want to evaluate the contralateral side as many times  popliteal aneurysms are bilateral.  This does not appear to be a traumatic process.  I am going to get a CT scan and then arrange a follow-up virtual visit to determine whether or not he needs surgical versus endovascular repair   Charlena Cross, MD, FACS [facility] [phone] Pager [phone]

## 2022-10-20 ENCOUNTER — Telehealth: Payer: Self-pay

## 2022-10-20 ENCOUNTER — Other Ambulatory Visit: Payer: Self-pay | Admitting: Family Medicine

## 2022-10-20 DIAGNOSIS — L409 Psoriasis, unspecified: Secondary | ICD-10-CM

## 2022-10-20 NOTE — Telephone Encounter (Signed)
Pt called wanting to know why a CTA of the abdomen was needed when the problem is in his leg.  I advised him, per office notes that it was for pre-surgery planning and read the plan of care to him.  Patient verbalized understanding and will proceed with the testing.

## 2022-10-26 DIAGNOSIS — K4020 Bilateral inguinal hernia, without obstruction or gangrene, not specified as recurrent: Secondary | ICD-10-CM | POA: Diagnosis not present

## 2022-10-26 DIAGNOSIS — I739 Peripheral vascular disease, unspecified: Secondary | ICD-10-CM | POA: Diagnosis not present

## 2022-10-26 DIAGNOSIS — K5730 Diverticulosis of large intestine without perforation or abscess without bleeding: Secondary | ICD-10-CM | POA: Diagnosis not present

## 2022-10-26 DIAGNOSIS — I700 Atherosclerosis of aorta: Secondary | ICD-10-CM | POA: Diagnosis not present

## 2022-10-26 DIAGNOSIS — I70203 Unspecified atherosclerosis of native arteries of extremities, bilateral legs: Secondary | ICD-10-CM | POA: Diagnosis not present

## 2022-10-26 DIAGNOSIS — I724 Aneurysm of artery of lower extremity: Secondary | ICD-10-CM | POA: Diagnosis not present

## 2022-11-01 ENCOUNTER — Ambulatory Visit (INDEPENDENT_AMBULATORY_CARE_PROVIDER_SITE_OTHER): Payer: Medicare HMO | Admitting: Surgery

## 2022-11-01 ENCOUNTER — Encounter: Payer: Self-pay | Admitting: Surgery

## 2022-11-01 DIAGNOSIS — I724 Aneurysm of artery of lower extremity: Secondary | ICD-10-CM

## 2022-11-01 DIAGNOSIS — I714 Abdominal aortic aneurysm, without rupture, unspecified: Secondary | ICD-10-CM | POA: Diagnosis not present

## 2022-11-01 NOTE — Progress Notes (Signed)
[facility]ist of [place]  Patient name: Rhon Bloome. MRN: [id number] DOB: Aug 09, 1945 Sex: male      Virtual Visit via Telephone Note   Because of Darril Suleman Hands Jr.'s co-morbid illnesses, he is at least at moderate risk for complications without adequate follow up.  This format is felt to be most appropriate for this patient at this time.  The patient did not have access to video technology/had technical difficulties with video requiring transitioning to audio format only (telephone).  All issues noted in this document were discussed and addressed.  No physical exam could be performed with this format.  Please refer to the patient's chart for his consent to telehealth for [facility].   Patient Location: Home Provider Location: Office/Clinic     REASON FOR APPOINTMENT:    Follow up  HISTORY OF PRESENT ILLNESS:   Johnaven Trites. is a 77 y.o. 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  This was discovered on a MRI of his left knee.  Maximum diameter is 2.5 cm measured by MRI.  It is located 4 cm proximal to the posterior left knee joint this was images when looking for further evaluation of left knee pain from a fall approximately a month ago.  He is having lateral knee pain. I sent him for a CT scan to better define his anatomy and see if he is a endovascular candidate.   Patient has a history of coronary artery disease.  He is status post NSTEMI in 2015.  He has a history of COPD from former tobacco abuse.  He is on a statin for hypercholesterolem    PAST MEDICAL HISTORY    Past Medical History:  Diagnosis Date   CAD (coronary artery disease)    a. 03/2014 NSTEMI - initially refused cath;  b. 03/2014 Echo: EF 55-60%;  c. 04/2014 Cath: LM nl, LAD 20-30p, 28m, D1 30-40, D2 small, 60-70, LCX nl, RCA 100p (CTO), L->R collaterals, EF 55-60%-->Med Rx.   COPD (chronic obstructive pulmonary disease) (HCC)     History of tobacco abuse    a. 40+ pack years, quit 03/2014.   Hyperlipidemia    Morbid obesity (HCC)    Psoriasis      FAMILY HISTORY   Family History  Problem Relation Age of Onset   Stroke Mother 56   Allergies Mother    Asthma Mother    Cancer Mother        colon   Heart disease Father 107       quadruple bypass   Healthy Daughter    Healthy Daughter     SOCIAL HISTORY:   Social History   Socioeconomic History   Marital status: Married    Spouse name: Steward Drone   Number of children: 2   Years of education: associates degree   Highest education level: Associate degree: occupational, Scientist, product/process development, or vocational program  Occupational History   Occupation: Retired    Comment: Wellsite geologist  Tobacco Use   Smoking status: Former    Packs/day: 0.50    Years: 49.00    Additional pack years: 0.00    Total pack years: 24.50    Types: Cigarettes    Quit date: 04/07/2014    Years since quitting: 8.5   Smokeless tobacco: Never  Vaping Use   Vaping Use: Never used  Substance and Sexual Activity  Alcohol use: No    Alcohol/week: 0.0 standard drinks of alcohol   Drug use: No   Sexual activity: Yes  Other Topics Concern   Not on file  Social History Narrative   Lives home with wife. Children live nearby   Social Determinants of Health   Financial Resource Strain: Low Risk  (06/16/2022)   Overall Financial Resource Strain (CARDIA)    Difficulty of Paying Living Expenses: Not hard at all  Food Insecurity: No Food Insecurity (06/16/2022)   Hunger Vital Sign    Worried About Running Out of Food in the Last Year: Never true    Ran Out of Food in the Last Year: Never true  Transportation Needs: No Transportation Needs (06/16/2022)   PRAPARE - Administrator, Civil Service (Medical): No    Lack of Transportation (Non-Medical): No  Physical Activity: Inactive (06/16/2022)   Exercise Vital Sign    Days of Exercise per Week: 0 days    Minutes of  Exercise per Session: 0 min  Stress: No Stress Concern Present (06/16/2022)   [employer] of Occupational Health - Occupational Stress Questionnaire    Feeling of Stress : Not at all  Social Connections: Moderately Integrated (06/16/2022)   Social Connection and Isolation Panel [NHANES]    Frequency of Communication with Friends and Family: More than three times a week    Frequency of Social Gatherings with Friends and Family: More than three times a week    Attends Religious Services: More than 4 times per year    Active Member of [employer] or Organizations: No    Attends Banker Meetings: Never    Marital Status: Married  Catering manager Violence: Not At Risk (06/16/2022)   Humiliation, Afraid, Rape, and Kick questionnaire    Fear of Current or Ex-Partner: No    Emotionally Abused: No    Physically Abused: No    Sexually Abused: No    ALLERGIES:    Allergies  Allergen Reactions   Flonase [Fluticasone Propionate] Other (See Comments)    Nose bleeds.   Penicillins Other (See Comments)    REACTION: unknown    CURRENT MEDICATIONS:    Current Outpatient Medications  Medication Sig Dispense Refill   albuterol (VENTOLIN HFA) 108 (90 Base) MCG/ACT inhaler INHALE 2 PUFFS BY MOUTH EVERY 6 HOURS AS NEEDED FOR WHEEZING OR SHORTNESS OF BREATH 18 g 0   aspirin EC 81 MG tablet Take 81 mg by mouth daily.     Budeson-Glycopyrrol-Formoterol (BREZTRI AEROSPHERE) 160-9-4.8 MCG/ACT AERO Inhale 2 puffs into the lungs 2 (two) times daily. 32.1 g 3   folic acid (FOLVITE) 1 MG tablet Take 1 tablet (1 mg total) by mouth daily. 90 tablet 2   meloxicam (MOBIC) 15 MG tablet Take 1 tablet (15 mg total) by mouth daily.     metoprolol succinate (TOPROL-XL) 25 MG 24 hr tablet Take 1 tablet (25 mg total) by mouth daily. 90 tablet 3   rosuvastatin (CRESTOR) 20 MG tablet Take 1 tablet (20 mg total) by mouth daily. 90 tablet 3   triamcinolone ointment (KENALOG) 0.1 % APPLY ONE APPLICATION  TOPICALLY TWO TIMES A DAY 454 g 3   No current facility-administered medications for this visit.    REVIEW OF SYSTEMS:   Please see the history of present illness.     All other systems reviewed and are negative.     Recent Labs: 03/15/2022: Hemoglobin 14.1; Platelets 161 05/11/2022: ALT 28; BNP 49.3; BUN 19; Creatinine,  Ser 1.05; Potassium 4.9; Sodium 139   Recent Lipid Panel Lab Results  Component Value Date/Time   CHOL 124 03/15/2022 11:34 AM   TRIG 82 03/15/2022 11:34 AM   HDL 49 03/15/2022 11:34 AM   CHOLHDL 2.5 03/15/2022 11:34 AM   CHOLHDL 2.6 09/29/2015 09:14 AM   LDLCALC 59 03/15/2022 11:34 AM   LDLDIRECT 71 05/10/2016 04:39 PM    Wt Readings from Last 3 Encounters:  10/15/22 186 lb (84.4 kg)  10/13/22 185 lb 6.4 oz (84.1 kg)  10/01/22 190 lb 12.8 oz (86.5 kg)     STUDIES:   I have reviewed the following CT scan: Left SFA aneurysm, 2.6 cm with nearly circumferential thrombus. There is estimated 50% or greater narrowing of the artery at the outflow channel of the aneurysm.  Multilevel peripheral arterial disease including:  -aortic atherosclerosis Aortic Atherosclerosis (ICD10-I70.0).  -bilateral mild-to-moderate iliac arterial disease without high-grade stenosis  -moderate bilateral femoropopliteal atherosclerosis. On the right there is short segment 50% stenosis in the above knee popliteal artery, and on the left there is 50% or greater stenosis estimated of the outflow of the SFA aneurysm.  -bilateral mild tibial arterial disease, with 3 vessel runoff to the ankle. No variant right anterior tibial artery origin.  Maximum diameter of the infrarenal abdominal aorta 3.0 cm. Aortic aneurysm NOS (ICD10-I71.9).   ASSESSMENT and PLAN   Left SFA aneurysm: I discussed with the patient and his wife who is also on the telephone that I would recommend endovascular repair.  I discussed the risk of embolism and the details of the operation.  He wants to  think about this before scheduling.  If he elects not to proceed, we talked about having surveillance follow-up in 6 months  AAA: This was found on his CT scan with maximum diameter of 3 cm.  He will need to have surveillance imaging performed in the future.    Time:   Today, I have spent 15 minutes with the patient with telehealth technology discussing the above problems.        Charlena Cross, MD, FACS [facility] [phone] Pager [phone]

## 2022-11-04 ENCOUNTER — Telehealth: Payer: Self-pay

## 2022-11-04 NOTE — Telephone Encounter (Signed)
Spoke with patient regarding scheduling aortogram. He stated he has not decided yet and would like to give our office a call back next week. Office number provided to patient.

## 2022-11-09 DIAGNOSIS — M84352A Stress fracture, left femur, initial encounter for fracture: Secondary | ICD-10-CM | POA: Diagnosis not present

## 2022-11-09 DIAGNOSIS — M25562 Pain in left knee: Secondary | ICD-10-CM | POA: Diagnosis not present

## 2022-11-22 ENCOUNTER — Telehealth: Payer: Self-pay | Admitting: *Deleted

## 2022-11-22 NOTE — Telephone Encounter (Signed)
Spoke with patient to offer appt this week, he declined. States he will keep current scheduled OV. Nothing further needed at this time.

## 2022-11-22 NOTE — Telephone Encounter (Signed)
Called pt to follow up on Abdominal aortogram. Pt has decided to do a 6 month follow up  and not proceed with surgery. Mr.Angelucci also stated he will call back to make the appointment    Everlean Cherry, RN

## 2022-12-05 NOTE — Progress Notes (Unsigned)
Subjective:   Patient ID: CAHILL FEASER, male    DOB: 1945-12-23   MRN: [id number]    Brief patient profile:  89 yowm  Quit Smoking  04/07/14 when admit with dx of pna and better since discharge to point where can now get up the hill to feed the dog but at one point couldn't make it up the hill and self referred to pulmonary 05/21/2014 for eval and proved to have GOLD III copd/ mildly reversible  Admit date: 04/13/2014 Discharge date: 04/15/2014  Discharge Diagnoses:     SIRS   Acute respiratory failure with hypoxia due to acute bronchitis/failed OP therapy   NSTEMI (non-ST elevated myocardial infarction)/refused IP cath   Psoriasis   Smoker    Cough due to acute bronchitis      History of present illness:   77 year old male patient with history of psoriasis on methotrexate. Presented with complaints of cough and fever. Had initially been evaluated by his primary care physician and was started on azithromycin and cough suppressant therapy but after 3 days of treatment symptoms were worsening with dyspnea on exertion and extreme fatigue.  Evaluation in the ER revealed a mildly hypertensive patient with tachycardia.  He was afebrile with room air sats dipping to 83-84%. He had a white count of 20,300. Chest x-ray was suggestive of bronchitis. He also had elevated troponin at 3.79, total CK of 428, and a CKMB of 29. Patient had a prolonged stay in the ER and a second troponin was elevated at 6.16. EKG was nonischemic and only demonstrated sinus tachycardia with nonspecific ST abnormality. Cardiology was consulted. A stat echo was completed in the ER which revealed normal LVEF without any obvious wall motion abnormalities. CT of the chest had also been accomplished and demonstrated coronary artery calcifications. No indications for emergent catheterization per Cardiology. Because of active wheezing at time of presentation beta blockers were not initiated.   Hospital Course:     NSTEMI Evaluated by cardiology this admission. Initially no beta blockers due to active wheezing at presentation. Heparin drip was initiated. Lipid panel revealed suboptimal HDL at 28. Statin therapy initiated. Cardiology offered cardiac catheterization to the patient but he refused. He is amenable to proceeding with outpatient cardiac catheterization. Patient was ambulated on room air to determine if oxygen therapy would be indicated at discharge. He did not have any chest pain or shortness of breath with ambulation even when O2 sat 83%. Cardiac enzymes trended downward last troponin was 4.21 on 12/20. Our recommendation is that the patient follow-up with cardiology as an outpatient to pursue cardiac catheterization once current respiratory symptoms have resolved. Since no wheezing on date of discharge we have ordered a selective beta blocker carvedilol to begin at 3.125 mg twice a day.  Acute hypoxemic respiratory failure secondary to bronchitis Primary reason for presentation. Was hypoxemic with sats in the mid 80s in the ER. Had previously been treated with 3 days of Zithromax without improvement in symptoms. Started on Levaquin this admission which we'll continue for total of 7 days after discharge. Sputum culture pending at discharge. Gram stain revealed abundant gram negative rods and moderate gram positive cocci in pairs/clusters. No further wheezing since steroids have been tapered and weaned. No indication at this time to continue steroid taper using prednisone. On room air patient had an ambulatory sats down to 83% so home oxygen has been ordered. No wheezing with ambulation. We'll continue guaifenesin and cough suppression therapy as prior to admission.  Sats  w/ ambulation will need to be reassessed once the pt has recovered to his baseline, as it is anticipated that he will NOT need long term O2 support.    Psoriasis on methotrexate Patient had previously held last dosage of methotrexate  because he felt himself getting sick and will not resume this medication until current symptoms have resolved.  Nicotine abuse Patient counseled on sequelae of continuing to smoke including death by Dr. Joseph Art, as well as Dr. Sharon Seller  Procedures: 2-D echocardiogram:   - Left ventricle: The cavity size was normal. Wall thickness was normal. Systolic function was normal. The estimated ejection fraction was in the range of 55% to 60%. GLPSS is mildly reduced at -18%, there is focal inferoseptal strain abnormality. Left ventricular diastolic function parameters were normal. - Aortic valve: Sclerosis without stenosis. There was no significant regurgitation. - Mitral valve: Calcified annulus. There was trivial regurgitation. - Left atrium: The atrium was normal in size. - Right atrium: The atrium was mildly dilated.  Subsequent LHC 05/06/14  occ RCA,  LVEDP 15 with nl EF    05/21/2014 1st [facility] office visit/ Kaari Zeigler   Chief Complaint  Patient presents with   Pulmonary Consult    Referred by Dr. Jamelle Haring. Pt c/o SOB for the past 2 months "with anything strenuous".  He also c/o prod cough with white sputum. He states that he was dxed with PNA and bronchitis 04/13/14. He is using albuterol about 1 x per day on average.   breathing better since admit and wants to know does he have copd and does he need 02, maintaining off cigs Not limited by breathing from desired activities but not aerobic    06/19/2014 f/u ov/Neira Bentsen re: GOLD III copd  Chief Complaint  Patient presents with   Follow-up    Pt states that his breathing has improved some, but not back to his normal baseline. He states that he has used rescue inhaler only 3 x in the past month.   goal is to be able to walk fast - does fine slow pace, does fine sleeping/ sitting. Not using saba much at all at this point/ no noct or early am symptoms or coughing  Rec Start symbicort 160 Take 2 puffs first thing in am and then another 2  puffs about 12 hours later.  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 *   Procedures: Echocardiogram CTA chest with contrast  Consults:  Cardiology, Physical Therapy Evaluation and Treatment, and Occupational Therapy Evaluation and Treatment  Home Health/DME: DME: Home O2, nebulizer and Case Management has organized home health.   Hospital Course:  Chrishawn Slauter Heeney is a 77 y.o. 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  Briefly patient is a very pleasant 77 year old male who presented with signs and symptoms concerning for acute heart failure exacerbation with preserved EF. Patient has a known history of CAD, follows with cardiologist Dr. Mayford Knife and has scheduled follow-up appointment on 05/17/2022 at 1:30 PM. Cardiology services have evaluated patient during his hospital  stay, he is euvolemic and may slightly volume down based on mild increase in creatinine. Small IV fluid bolus given to patient on the morning of discharge. Discussed discharge plan with patient including home O2 which she did qualify for based on 6-minute walk test, home nebulizer machine with treatments, and follow-up to outpatient providers.  Issues addressed during current hospitalization:  1. HFpEF, acute exacerbation Patient established with Dr. Mayford Knife, has follow-up appointment on 05/17/2022 Euvolemic on today's exam, Lasix discontinued Creatinine slightly increased  at 1.5, patient voiding spontaneously, small IV bolus given with 250 mL prior to discharge home. Will hold off on as needed prescription for Lasix at discharge, patient has close follow-up with cardiology on 05/17/2022 and echocardiogram largely unremarkable this hospitalization. CTA chest on arrival did not show any sizable pleural effusions or pulmonary edema  2. COPD with acute exacerbation/bronchitis/emphysema Suspect that this is because of patient's persistent shortness of breath He was oxygen tested today and did qualify for home O2 both at rest and during exertion. Oxygen saturation on room air with patient at rest = 88% Oxygen saturation on room air with exertion / ambulation = 86% Oxygen saturation with exertion / ambulating on oxygen = 92% on 3 lpm Patient to have oxygen delivered to the hospital prior to discharge home Will have follow-up with cardiologist, and also has a scheduled appointment with pulmonology as well. New prescription for nebulizer machine and treatments provided No indication for antibiotic at this time.  3. Hypertension/CAD/remote MI BP currently within acceptable parameters Continue home medications Follow-up with outpatient cardiologist  4. Mixed hyperlipidemia Continue statin    05/18/2022  Re-establish  ov/Chaysen Tillman re: COPD GOLD 3    maint on prn saba neb  Chief Complaint  Patient presents with   Consult    COPD and SOB with low oxygen sats.  Admit 05/03/22 to 05/05/22.  Stopped Spiriva and Trelegy.  Did not see any improvement with sx.  Dyspnea:  baseline = walking at [employer] same pace as others / [place] 30 min  on setting 6 sob across the room post admit  Cough: rattling cough worse in am clear mucus  Sleeping: on side/ one pillow  SABA ZOX:[id number]  02: 3lpm 24/7   Rec Make sure you check your oxygen saturation  AT  your highest level of activity (not after you stop)   to be sure it stays over 90%   Plan A = Automatic = Always=   Breztri Take 2 puffs  first thing in am and then another 2 puffs about 12 hours later.   Work on inhaler technique: Plan B = Backup (to supplement plan A, not to replace it) Only use your albuterol inhaler as a rescue medication Plan C = Crisis (instead of Plan B but only if Plan B stops working) - only use your albuterol nebulizer if you first try Plan B  Make sure you check your oxygen saturation  AT  your highest level of activity (not after you stop)   to be sure it stays over 90%    06/29/2022  f/u ov/Conny Moening re: copd GOLD 3  maint on Breztri 2bid   Chief Complaint  Patient presents with   Follow-up    Breathing has improved. He has still not had to use his rescue inhaler. No new co's.   [place]/get up the hill to feed the dog easier  Dyspnea:  improved  Cough: better  Sleeping: flat bed/ on side one pillow SABA use: none  02: 3lpm  hs and sitting still on 3lpm  Covid status:   vax x 3  Lung cancer screening :  05/04/23   Rec My office will be contacting you by phone for referral to [employer] for best fit for portable 02   Make sure you check your oxygen saturation  AT  your highest level of activity (not after you stop)   to be sure it stays over 90% Please schedule a follow up visit in 3 months but call sooner if needed    12/06/2022  f/u ov/Koury Roddy re: GOLD 3 COPD/ -02 dep   maint on Breztri  Dyspnea:  not really that active yet  Cough: none  Sleeping: bed is flat/ one pillow  SABA use: none  02: 3lpm      No obvious day to day or daytime variability or assoc excess/ purulent sputum or mucus plugs or hemoptysis or cp or chest tightness, subjective wheeze or overt sinus or hb symptoms.   *** without nocturnal  or early am exacerbation  of respiratory  c/o's or need for noct saba. Also denies any obvious fluctuation of symptoms with weather or environmental changes or other aggravating or alleviating factors except as outlined above   No unusual exposure hx or h/o childhood pna/ asthma or knowledge of  premature birth.  Current Allergies, Complete Past Medical History, Past Surgical History, Family History, and Social History were reviewed in [employer] record.  ROS  The following are not active complaints unless bolded Hoarseness, sore throat, dysphagia, dental problems, itching, sneezing,  nasal congestion or discharge of excess mucus or purulent secretions, ear ache,   fever, chills, sweats, unintended wt loss or wt gain, classically pleuritic or exertional cp,  orthopnea pnd or arm/hand swelling  or leg swelling, presyncope, palpitations, abdominal pain, anorexia, nausea, vomiting, diarrhea  or change in bowel habits or change in bladder habits, change in stools or change in urine, dysuria, hematuria,  rash, arthralgias, visual complaints, headache, numbness, weakness or ataxia or problems with walking or coordination,  change in mood or  memory.        Current Meds  Medication Sig   albuterol (VENTOLIN HFA) 108 (90 Base) MCG/ACT inhaler INHALE 2 PUFFS BY MOUTH EVERY 6 HOURS AS NEEDED FOR WHEEZING OR SHORTNESS OF BREATH   aspirin EC 81 MG tablet Take 81 mg by mouth daily.   Budeson-Glycopyrrol-Formoterol (BREZTRI AEROSPHERE) 160-9-4.8 MCG/ACT AERO Inhale 2 puffs into the lungs 2 (two) times daily.   folic acid (FOLVITE) 1 MG tablet Take 1 tablet (1 mg total) by mouth daily.   metoprolol succinate (TOPROL-XL) 25 MG 24 hr tablet Take 1 tablet (25 mg total) by mouth daily.   rosuvastatin (CRESTOR) 20 MG tablet Take 1 tablet (20 mg total) by mouth daily.   triamcinolone ointment (KENALOG) 0.1 % APPLY ONE APPLICATION TOPICALLY TWO TIMES A DAY                     Objective:   Physical Exam   Wts  12/06/2022         ***  06/29/2022          190  05/18/2022        190 .06/19/2014       201     05/21/14 194 lb 6.4 oz (88.179 kg)  05/10/14 192 lb 12.8 oz (87.454 kg)  05/09/14 192 lb (87.091 kg)    Vital signs reviewed  12/06/2022  - Note at rest 02  sats  ***% on  ***   General appearance:    ***  Mild barr ***            Assessment & Plan:

## 2022-12-06 ENCOUNTER — Ambulatory Visit: Payer: Medicare HMO | Admitting: Internal Medicine

## 2022-12-06 ENCOUNTER — Encounter: Payer: Self-pay | Admitting: Internal Medicine

## 2022-12-06 VITALS — BP 128/66 | HR 75 | Ht 68.0 in | Wt 183.8 lb

## 2022-12-06 DIAGNOSIS — J441 Chronic obstructive pulmonary disease with (acute) exacerbation: Secondary | ICD-10-CM

## 2022-12-06 DIAGNOSIS — J9611 Chronic respiratory failure with hypoxia: Secondary | ICD-10-CM | POA: Diagnosis not present

## 2022-12-06 DIAGNOSIS — J449 Chronic obstructive pulmonary disease, unspecified: Secondary | ICD-10-CM

## 2022-12-06 NOTE — Patient Instructions (Addendum)
Make sure you check your oxygen saturation  AT  your highest level of activity (not after you stop)   to be sure it stays over 90% and adjust  02 flow upward to maintain this level if needed but remember to turn it back to previous settings when you stop (to conserve your supply).   Continue 3lpm 02  at bedtime  Ok to try breztri 2 puffs 1st thing am and the evening puffs are optional if you are doing great    Please schedule a follow up visit in 6  months but call sooner if needed  and bring portable concentrator and your inhalers

## 2022-12-07 ENCOUNTER — Encounter: Payer: Self-pay | Admitting: Internal Medicine

## 2022-12-07 DIAGNOSIS — J449 Chronic obstructive pulmonary disease, unspecified: Secondary | ICD-10-CM | POA: Insufficient documentation

## 2022-12-07 NOTE — Assessment & Plan Note (Addendum)
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  - 02 dep s/p d/c 05/14/22   - 12/06/2022  After extensive coaching inhaler device,  effectiveness =   80%  hfa > continue breztri    Group D (now reclassified as E) in terms of symptom/risk and laba/lama/ICS  therefore appropriate rx at this point >>>  breztri and  already on approp saba prn/ reviewed option of leaving off pm Breztri for cost savings if no resp cc noct or in am prior to morning breztri

## 2022-12-07 NOTE — Assessment & Plan Note (Signed)
02 dep since admit 04/2021 - 05/18/2022   Walked on 3lpm cont  x  2  lap(s) =  approx 500 ft  @ mod  pace, stopped due to sob  with lowest 02 sats 93%   - 06/29/2022   Walked on RA  x  2  lap(s) =  approx 500  ft  @ mod pace, stopped due to desats to 87% then placed on 4lpm POC  with lowest 02 sats 94% and finished another 250 ft    Again advised: Make sure you check your oxygen saturation  AT  your highest level of activity (not after you stop)   to be sure it stays over 90% and adjust  02 flow upward to maintain this level if needed but remember to turn it back to previous settings when you stop (to conserve your supply).   F/u 6 m, call sooner prn          Each maintenance medication was reviewed in detail including emphasizing most importantly the difference between maintenance and prns and under what circumstances the prns are to be triggered using an action plan format where appropriate.  Total time for H and P, chart review, counseling, reviewing  02  device(s) and generating customized AVS unique to this office visit / same day charting = 22 min

## 2022-12-13 ENCOUNTER — Ambulatory Visit: Payer: Medicare HMO | Admitting: Surgery

## 2022-12-15 ENCOUNTER — Encounter: Payer: Self-pay | Admitting: Family Medicine

## 2023-01-21 ENCOUNTER — Other Ambulatory Visit: Payer: Self-pay | Admitting: Family Medicine

## 2023-01-21 DIAGNOSIS — E782 Mixed hyperlipidemia: Secondary | ICD-10-CM

## 2023-01-31 DIAGNOSIS — H903 Sensorineural hearing loss, bilateral: Secondary | ICD-10-CM | POA: Diagnosis not present

## 2023-02-14 DIAGNOSIS — H903 Sensorineural hearing loss, bilateral: Secondary | ICD-10-CM | POA: Diagnosis not present

## 2023-03-08 ENCOUNTER — Ambulatory Visit: Payer: Medicare HMO

## 2023-03-08 ENCOUNTER — Encounter: Payer: Self-pay | Admitting: Pulmonary Disease

## 2023-03-08 ENCOUNTER — Ambulatory Visit: Payer: Medicare HMO | Admitting: Pulmonary Disease

## 2023-03-08 VITALS — BP 118/60 | HR 70 | Temp 98.6°F | Ht 68.0 in | Wt 184.4 lb

## 2023-03-08 DIAGNOSIS — J441 Chronic obstructive pulmonary disease with (acute) exacerbation: Secondary | ICD-10-CM

## 2023-03-08 DIAGNOSIS — R059 Cough, unspecified: Secondary | ICD-10-CM | POA: Diagnosis not present

## 2023-03-08 DIAGNOSIS — J449 Chronic obstructive pulmonary disease, unspecified: Secondary | ICD-10-CM | POA: Diagnosis not present

## 2023-03-08 DIAGNOSIS — R0600 Dyspnea, unspecified: Secondary | ICD-10-CM | POA: Diagnosis not present

## 2023-03-08 DIAGNOSIS — I700 Atherosclerosis of aorta: Secondary | ICD-10-CM | POA: Diagnosis not present

## 2023-03-08 MED ORDER — PREDNISONE 20 MG PO TABS: ORAL_TABLET | ORAL | 0 refills | Status: AC

## 2023-03-08 MED ORDER — DOXYCYCLINE HYCLATE 100 MG PO TABS: 100.0000 mg | ORAL_TABLET | Freq: Two times a day (BID) | ORAL | 0 refills | Status: DC

## 2023-03-08 NOTE — Progress Notes (Signed)
@Patient  ID: Joseph Shields., male    DOB: 11-26-1945, 77 y.o.   MRN: [id number]  Chief Complaint  Patient presents with   Acute Visit    Waking up with low grade fever, chest congestion, some cough, SOB with exertion x 1 week.    Referring provider: Mechele Claude, MD  HPI:   77 y.o. man with history of COPD based on pulmonary function test whom we are seeing for 1 week of subjective fever, increased cough, increased dyspnea on exertion as an acute visit.  Most prior pulmonary notes reviewed.  Noted symptom about a week ago.  Waking up in the morning with short damp.  Not soaking through sheets but damp.  Feels warm.  Unclear if he is actually measuring his temperature.  He is at increased cough over this timeframe.  Increased shortness of breath at rest.  Increased dyspnea on exertion.  He took a home COVID test was negative.  He denies sick contacts.  He has used his albuterol a bit more over the last week.  In general he rarely uses it.  Has used it 2 or 3 times.  Less than once a day even despite worsened symptoms.  He does find it to be beneficial in terms of improving his acute worsened symptoms.  Questionaires / Pulmonary Flowsheets:   ACT:      No data to display          MMRC:     No data to display          Epworth:      No data to display          Tests:   FENO:  No results found for: "NITRICOXIDE"  PFT:    Latest Ref Rng & Units 06/19/2014    1:05 PM  PFT Results  FVC-Pre L 2.37   FVC-Predicted Pre % 58   FVC-Post L 2.71   FVC-Predicted Post % 67   Pre FEV1/FVC % % 45   Post FEV1/FCV % % 46   FEV1-Pre L 1.06   FEV1-Predicted Pre % 35   FEV1-Post L 1.24   DLCO uncorrected ml/min/mmHg 15.69   DLCO UNC% % 55   DLVA Predicted % 68   TLC L 7.61   TLC % Predicted % 118   RV % Predicted % 206   Personally reviewed and interpreted as severe fixed obstruction without significant bronchodilator response, lung volumes consistent with air  trapping, DLCO is moderately reduced  WALK:     06/29/2022    2:20 PM 05/18/2022    4:59 PM 05/21/2014    2:59 PM  SIX MIN WALK  Supplimental Oxygen during Test? (L/min) No No No  Tech Comments: after desat on RA to 87%, pt placed on 2lpm pulsed o2 and sats increased to 90%, walked 50 ft and sats dropped to 88%, placed on 4lpm pulsed o2 and walked whole additional lap with sats at end 93%4lpm pulsed o2/moderate pace/no SOB/lmr Patient walked at moderate pace, no walking devices needed.  Patient was able to complete 2 laps.  Patient c/o SOB and was unable to complete 3rd lap.  patient was taken back to exam room and after 2 minutes rest, SaO2 at 97% and heart rate 71.  Patient states no further SOB or dyspnea noted. normal pace/no SOB//lmr    Imaging: Personally reviewed and as per EMR and discussion this note No results found.  Lab Results: Personally reviewed CBC    Component Value  Date/Time   WBC 6.8 03/15/2022 1134   WBC 10.0 11/05/2015 1151   RBC 4.48 03/15/2022 1134   RBC 4.35 11/05/2015 1151   HGB 14.1 03/15/2022 1134   HCT 44.0 03/15/2022 1134   PLT 161 03/15/2022 1134   MCV 98 (H) 03/15/2022 1134   MCH 31.5 03/15/2022 1134   MCH 31.3 11/05/2015 1151   MCHC 32.0 03/15/2022 1134   MCHC 33.0 11/05/2015 1151   RDW 13.3 03/15/2022 1134   LYMPHSABS 1.6 03/15/2022 1134   MONOABS 1.8 (H) 11/05/2015 1151   EOSABS 0.3 03/15/2022 1134   BASOSABS 0.0 03/15/2022 1134    BMET    Component Value Date/Time   NA 139 05/11/2022 1416   K 4.9 05/11/2022 1416   CL 100 05/11/2022 1416   CO2 28 05/11/2022 1416   GLUCOSE 88 05/11/2022 1416   GLUCOSE 121 (H) 11/05/2015 1151   BUN 19 05/11/2022 1416   CREATININE 1.05 05/11/2022 1416   CALCIUM 9.3 05/11/2022 1416   GFRNONAA 72 03/03/2020 1112   GFRAA 83 03/03/2020 1112    BNP    Component Value Date/Time   BNP 49.3 05/11/2022 1416    ProBNP    Component Value Date/Time   PROBNP 1,376.0 (H) 04/13/2014 2116    Specialty  Problems       Pulmonary Problems   Hypoxia    D/c 02 05/21/2014       SOB (shortness of breath)    - 05/21/2014  Walked RA x 3 laps @ 185 ft each stopped due to  End of study/ no sob or desat       COPD with acute exacerbation (HCC)      Quit smoking 03/2014  - 05/21/2014  Walked RA x 3 laps @ 185 ft each stopped due to  End of study/ no sob or desat  - 06/19/2014 pfts  FEV1  1.24 (41%) ratio 46 p 17% improvement from saba, dlco 55 corrects to 68   - 06/19/2014 p extensive coaching HFA effectiveness =    90% > symbicort 160 2bid trial - 05/18/2022  After extensive coaching inhaler device,  effectiveness =    Breztri Take 2 puffs first thing in am and then another 2 puffs about 12 hours later.  - 02 dep s/p d/c 05/14/22  - 12/06/2022  After extensive coaching inhaler device,  effectiveness =   80%  hfa         Chronic respiratory failure with hypoxia (HCC)    02 dep since admit 04/2021 - 05/18/2022   Walked on 3lpm cont  x  2  lap(s) =  approx 500 ft  @ mod  pace, stopped due to sob  with lowest 02 sats 93%   - 06/29/2022   Walked on RA  x  2  lap(s) =  approx 500  ft  @ mod pace, stopped due to desats to 87% then placed on 4lpm POC  with lowest 02 sats 94% and finished another 250 ft          COPD GOLD 3    Quit smoking 03/2014  - 05/21/2014  Walked RA x 3 laps @ 185 ft each stopped due to  End of study/ no sob or desat  - 06/19/2014 pfts  FEV1  1.24 (41%) ratio 46 p 17% improvement from saba, dlco 55 corrects to 68   - 06/19/2014 p extensive coaching HFA effectiveness =    90% > symbicort 160 2bid trial - 05/18/2022  After extensive coaching inhaler device,  effectiveness =    Breztri Take 2 puffs first thing in am and then another 2 puffs about 12 hours later.  - 02 dep s/p d/c 05/14/22  - 12/06/2022  After extensive coaching inhaler device,  effectiveness =   80%  hfa > continue breztri        Allergies  Allergen Reactions   Flonase [Fluticasone Propionate] Other (See Comments)     Nose bleeds.   Penicillins Other (See Comments)    REACTION: unknown    Immunization History  Administered Date(s) Administered   Influenza, High Dose Seasonal PF 04/04/2015   Moderna Sars-Covid-2 Vaccination 05/16/2019, 06/13/2019, 04/23/2020   Pneumococcal Conjugate-13 04/04/2015   Pneumococcal Polysaccharide-23 10/17/2017    Past Medical History:  Diagnosis Date   CAD (coronary artery disease)    a. 03/2014 NSTEMI - initially refused cath;  b. 03/2014 Echo: EF 55-60%;  c. 04/2014 Cath: LM nl, LAD 20-30p, 41m, D1 30-40, D2 small, 60-70, LCX nl, RCA 100p (CTO), L->R collaterals, EF 55-60%-->Med Rx.   COPD (chronic obstructive pulmonary disease) (HCC)    History of tobacco abuse    a. 40+ pack years, quit 03/2014.   Hyperlipidemia    Morbid obesity (HCC)    Psoriasis     Tobacco History: Social History   Tobacco Use  Smoking Status Former   Current packs/day: 0.00   Average packs/day: 0.5 packs/day for 49.0 years (24.5 ttl pk-yrs)   Types: Cigarettes   Start date: 04/07/1965   Quit date: 04/07/2014   Years since quitting: 8.9  Smokeless Tobacco Never   Counseling given: Not Answered   Continue to not smoke  Outpatient Encounter Medications as of 03/08/2023  Medication Sig   albuterol (VENTOLIN HFA) 108 (90 Base) MCG/ACT inhaler INHALE 2 PUFFS BY MOUTH EVERY 6 HOURS AS NEEDED FOR WHEEZING OR SHORTNESS OF BREATH   aspirin EC 81 MG tablet Take 81 mg by mouth daily.   Budeson-Glycopyrrol-Formoterol (BREZTRI AEROSPHERE) 160-9-4.8 MCG/ACT AERO Inhale 2 puffs into the lungs 2 (two) times daily.   cetirizine (ZYRTEC) 10 MG tablet Take 10 mg by mouth daily.   doxycycline (VIBRA-TABS) 100 MG tablet Take 1 tablet (100 mg total) by mouth 2 (two) times daily.   folic acid (FOLVITE) 1 MG tablet Take 1 tablet (1 mg total) by mouth daily.   metoprolol succinate (TOPROL-XL) 25 MG 24 hr tablet Take 1 tablet (25 mg total) by mouth daily.   predniSONE (DELTASONE) 20 MG tablet Take 2  tablets (40 mg total) by mouth daily with breakfast for 5 days, THEN 1 tablet (20 mg total) daily with breakfast for 5 days.   rosuvastatin (CRESTOR) 20 MG tablet TAKE 1 TABLET EVERY DAY   triamcinolone ointment (KENALOG) 0.1 % APPLY ONE APPLICATION TOPICALLY TWO TIMES A DAY   No facility-administered encounter medications on file as of 03/08/2023.     Review of Systems  Review of Systems  No chest pain with exertion.  No orthopnea or PND.  Comprehensive review of systems otherwise negative. Physical Exam  BP 118/60 (BP Location: Left Arm, Patient Position: Sitting, Cuff Size: Normal)   Pulse 70   Temp 98.6 F (37 C) (Oral)   Ht 5\' 8"  (1.727 m)   Wt 184 lb 6.4 oz (83.6 kg)   SpO2 94%   BMI 28.04 kg/m   Wt Readings from Last 5 Encounters:  03/08/23 184 lb 6.4 oz (83.6 kg)  12/06/22 183 lb 12.8 oz (83.4 kg)  10/15/22 186 lb (84.4 kg)  10/13/22 185 lb 6.4 oz (84.1 kg)  10/01/22 190 lb 12.8 oz (86.5 kg)    BMI Readings from Last 5 Encounters:  03/08/23 28.04 kg/m  12/06/22 27.95 kg/m  10/15/22 28.28 kg/m  10/13/22 28.19 kg/m  10/01/22 29.01 kg/m     Physical Exam General: Sitting in chair, no acute distress Eyes: EOMI, no icterus Neck: Supple, no JVP appreciated Pulmonary: Distant, clear, normal work of breathing Cardiovascular: Warm, no edema Abdomen: Nondistended, bowel sounds present MSK: No synovitis, no joint effusion Neuro: Normal gait, no weakness Psych: Normal mood, full affect   Assessment & Plan:   Cough, increasing dyspnea with exertion: Present over the last 7 days.  Associated with subjective fevers.  Negative COVID test at home.  Has not tested for flu.  Outside of timeline for antiretrovirals.  Lung exam overall clear.  Will treat for COPD exacerbation with prednisone taper and doxycycline.  Chest x-ray today.  COPD: Diagnosed on pulmonary function test in the past.  Encouraged to continue Breztri twice daily and albuterol as  needed.   Return in about 3 months (around 06/08/2023) for Follow-up with Dr. Sherene Sires.   Karren Burly, MD 03/08/2023   This appointment required 41 minutes of patient care (this includes precharting, chart review, review of results, face-to-face care, etc.).

## 2023-03-08 NOTE — Patient Instructions (Addendum)
It is nice to meet you  We will get a chest x-ray today, your lungs sound clear but just to be sure  Given the increasing cough and shortness of breath, the next best step is to treat with antibiotics and prednisone for your COPD  Take prednisone 40 mg for 5 days then 20 mg for 5 days then stop  Take doxycycline 1 tablet twice a day for 7 days then stop  No changes to your inhalers, continue all inhalers as you are  If not improving by Thursday afternoon or Friday please contact our office  Follow-up with Dr. Sherene Sires in 2 to 3 months

## 2023-04-11 ENCOUNTER — Other Ambulatory Visit: Payer: Self-pay | Admitting: Family Medicine

## 2023-04-11 DIAGNOSIS — L409 Psoriasis, unspecified: Secondary | ICD-10-CM

## 2023-04-11 DIAGNOSIS — E782 Mixed hyperlipidemia: Secondary | ICD-10-CM

## 2023-04-18 ENCOUNTER — Ambulatory Visit (INDEPENDENT_AMBULATORY_CARE_PROVIDER_SITE_OTHER): Payer: Medicare HMO | Admitting: Family Medicine

## 2023-04-18 ENCOUNTER — Encounter: Payer: Self-pay | Admitting: Family Medicine

## 2023-04-18 VITALS — BP 128/60 | HR 60 | Temp 97.7°F | Ht 68.0 in

## 2023-04-18 DIAGNOSIS — I10 Essential (primary) hypertension: Secondary | ICD-10-CM | POA: Diagnosis not present

## 2023-04-18 DIAGNOSIS — E782 Mixed hyperlipidemia: Secondary | ICD-10-CM | POA: Diagnosis not present

## 2023-04-18 DIAGNOSIS — L409 Psoriasis, unspecified: Secondary | ICD-10-CM

## 2023-04-18 MED ORDER — BETAMETHASONE DIPROPIONATE AUG 0.05 % EX CREA: TOPICAL_CREAM | Freq: Two times a day (BID) | CUTANEOUS | 1 refills | Status: DC

## 2023-04-18 NOTE — Progress Notes (Signed)
Subjective:  Patient ID: Joseph Alstrom., male    DOB: [birth year]  Age: 77 y.o. MRN: [id number]  CC: Medical Management of Chronic Issues   HPI Joseph Shields. presents for PSORIASIS OUTBREAK. Triamcinolone  helping but not clearing.  Joseph Shields has gone off of his oral medications due to concerns about immune suppression.  Joseph Shields has multiple plaques breaking out.  There is an scattered areas.  The worst is at the right hip.     presents for  follow-up of hypertension. Patient has no history of headache chest pain or shortness of breath or recent cough. Patient also denies symptoms of TIA such as focal numbness or weakness. Patient denies side effects from medication. States taking it regularly.   in for follow-up of elevated cholesterol. Doing well without complaints on current medication. Denies side effects of statin including myalgia and arthralgia and nausea. Currently no chest pain, shortness of breath or other cardiovascular related symptoms noted.  Patient followed by Dr. Sherene Sires of pulmonary for his COPD.  Doing quite well.  No shortness of breath recently.  Dr. Sherene Sires told him Joseph Shields could reduce his breztri  to once a day.      04/18/2023    1:03 PM 10/13/2022    2:34 PM 06/16/2022   12:08 PM  Depression screen PHQ 2/9  Decreased Interest 0 0 0  Down, Depressed, Hopeless 0 0 0  PHQ - 2 Score 0 0 0    History Joseph Shields has a past medical history of CAD (coronary artery disease), COPD (chronic obstructive pulmonary disease) (HCC), History of tobacco abuse, Hyperlipidemia, Morbid obesity (HCC), and Psoriasis.   Joseph Shields has a past surgical history that includes Spine surgery; left heart catheterization with coronary angiogram (N/A, 05/06/2014); and Cataract extraction, bilateral.   His family history includes Allergies in his mother; Asthma in his mother; Cancer in his mother; Healthy in his daughter and daughter; Heart disease (age of onset: 34) in his father; Stroke (age of onset: 44) in his  mother.Joseph Shields reports that Joseph Shields quit smoking about 9 years ago. His smoking use included cigarettes. Joseph Shields started smoking about 58 years ago. Joseph Shields has a 24.5 pack-year smoking history. Joseph Shields has never used smokeless tobacco. Joseph Shields reports that Joseph Shields does not drink alcohol and does not use drugs.    ROS Review of Systems  Constitutional:  Negative for fever.  Respiratory:  Negative for shortness of breath.   Cardiovascular:  Negative for chest pain.  Musculoskeletal:  Positive for arthralgias (left knee injury 6 mos ago. Finally getting better. Seeing knee specialist.).  Skin:  Positive for rash.    Objective:  BP 128/60   Pulse 60   Temp 97.7 F (36.5 C)   Ht 5\' 8"  (1.727 m)   SpO2 93%   BMI 28.04 kg/m   BP Readings from Last 3 Encounters:  04/18/23 128/60  03/08/23 118/60  12/06/22 128/66    Wt Readings from Last 3 Encounters:  03/08/23 184 lb 6.4 oz (83.6 kg)  12/06/22 183 lb 12.8 oz (83.4 kg)  10/15/22 186 lb (84.4 kg)     Physical Exam Vitals reviewed.  Constitutional:      Appearance: Joseph Shields is well-developed.  HENT:     Head: Normocephalic and atraumatic.     Right Ear: External ear normal.     Left Ear: External ear normal.     Mouth/Throat:     Pharynx: No oropharyngeal exudate or posterior oropharyngeal erythema.  Eyes:  Pupils: Pupils are equal, round, and reactive to light.  Cardiovascular:     Rate and Rhythm: Normal rate and regular rhythm.     Heart sounds: No murmur heard. Pulmonary:     Effort: No respiratory distress.     Breath sounds: Normal breath sounds.  Musculoskeletal:     Cervical back: Normal range of motion and neck supple.  Skin:    Findings: Lesion (Typical plaque of psoriasis with erythema and white scale with mild induration in a geographic distribution along the area of the right greater trochanter.  This measures about 3 x 6 cm.) present.  Neurological:     Mental Status: Joseph Shields is alert and oriented to person, place, and time.        Assessment & Plan:   Joseph "W.L." was seen today for medical management of chronic issues.  Diagnoses and all orders for this visit:  Essential hypertension -     CBC with Differential/Platelet -     CMP14+EGFR  Mixed hyperlipidemia -     Lipid panel  Psoriasis  Other orders -     augmented betamethasone dipropionate (DIPROLENE-AF) 0.05 % cream; Apply topically 2 (two) times daily. At affected areas (avoid face and genitals)       I have discontinued Joseph Noa L. Cotterill Montez Hageman. "W.L."'s triamcinolone ointment and doxycycline. I am also having him start on augmented betamethasone dipropionate. Additionally, I am having him maintain his aspirin EC, albuterol, Breztri Aerosphere, metoprolol succinate, cetirizine, rosuvastatin, and folic acid.  Allergies as of 04/18/2023       Reactions   Flonase [fluticasone Propionate] Other (See Comments)   Nose bleeds.   Penicillins Other (See Comments)   REACTION: unknown        Medication List        Accurate as of April 18, 2023  5:31 PM. If you have any questions, ask your nurse or doctor.          STOP taking these medications    doxycycline 100 MG tablet Commonly known as: VIBRA-TABS Stopped by: Joseph Shields   triamcinolone ointment 0.1 % Commonly known as: KENALOG Stopped by: Joseph Shields       TAKE these medications    albuterol 108 (90 Base) MCG/ACT inhaler Commonly known as: VENTOLIN HFA INHALE 2 PUFFS BY MOUTH EVERY 6 HOURS AS NEEDED FOR WHEEZING OR SHORTNESS OF BREATH   aspirin EC 81 MG tablet Take 81 mg by mouth daily.   augmented betamethasone dipropionate 0.05 % cream Commonly known as: DIPROLENE-AF Apply topically 2 (two) times daily. At affected areas (avoid face and genitals) Started by: Hansen Carino   Breztri Aerosphere 160-9-4.8 MCG/ACT Aero Generic drug: Budeson-Glycopyrrol-Formoterol Inhale 2 puffs into the lungs 2 (two) times daily.   cetirizine 10 MG tablet Commonly  known as: ZYRTEC Take 10 mg by mouth daily.   folic acid 1 MG tablet Commonly known as: FOLVITE TAKE 1 TABLET EVERY DAY   metoprolol succinate 25 MG 24 hr tablet Commonly known as: TOPROL-XL Take 1 tablet (25 mg total) by mouth daily.   rosuvastatin 20 MG tablet Commonly known as: CRESTOR TAKE 1 TABLET EVERY DAY         Follow-up: Return in about 6 months (around 10/17/2023).  Mechele Claude, M.D.

## 2023-04-19 DIAGNOSIS — U071 COVID-19: Secondary | ICD-10-CM | POA: Diagnosis not present

## 2023-04-19 LAB — CMP14+EGFR
ALT: 20 [IU]/L (ref 0–44)
AST: 29 [IU]/L (ref 0–40)
Albumin: 4.1 g/dL (ref 3.8–4.8)
Alkaline Phosphatase: 61 [IU]/L (ref 44–121)
BUN/Creatinine Ratio: 22 (ref 10–24)
BUN: 17 mg/dL (ref 8–27)
Bilirubin Total: 0.5 mg/dL (ref 0.0–1.2)
CO2: 22 mmol/L (ref 20–29)
Calcium: 9.1 mg/dL (ref 8.6–10.2)
Chloride: 106 mmol/L (ref 96–106)
Creatinine, Ser: 0.76 mg/dL (ref 0.76–1.27)
Globulin, Total: 2.5 g/dL (ref 1.5–4.5)
Glucose: 91 mg/dL (ref 70–99)
Potassium: 4.3 mmol/L (ref 3.5–5.2)
Sodium: 140 mmol/L (ref 134–144)
Total Protein: 6.6 g/dL (ref 6.0–8.5)
eGFR: 93 mL/min/{1.73_m2} (ref >59)

## 2023-04-19 LAB — LIPID PANEL
Chol/HDL Ratio: 2.5 {ratio} (ref 0.0–5.0)
Cholesterol, Total: 144 mg/dL (ref 100–199)
HDL: 57 mg/dL (ref >39)
LDL Chol Calc (NIH): 75 mg/dL (ref 0–99)
Triglycerides: 59 mg/dL (ref 0–149)
VLDL Cholesterol Cal: 12 mg/dL (ref 5–40)

## 2023-04-19 LAB — CBC WITH DIFFERENTIAL/PLATELET
Basophils Absolute: 0 10*3/uL (ref 0.0–0.2)
Basos: 0 %
EOS (ABSOLUTE): 0.2 10*3/uL (ref 0.0–0.4)
Eos: 2 %
Hematocrit: 39.2 % (ref 37.5–51.0)
Hemoglobin: 12.4 g/dL — ABNORMAL LOW (ref 13.0–17.7)
Immature Grans (Abs): 0 10*3/uL (ref 0.0–0.1)
Immature Granulocytes: 0 %
Lymphocytes Absolute: 1.6 10*3/uL (ref 0.7–3.1)
Lymphs: 21 %
MCH: 30.8 pg (ref 26.6–33.0)
MCHC: 31.6 g/dL (ref 31.5–35.7)
MCV: 98 fL — ABNORMAL HIGH (ref 79–97)
Monocytes Absolute: 0.8 10*3/uL (ref 0.1–0.9)
Monocytes: 10 %
Neutrophils Absolute: 5.3 10*3/uL (ref 1.4–7.0)
Neutrophils: 67 %
Platelets: 172 10*3/uL (ref 150–450)
RBC: 4.02 x10E6/uL — ABNORMAL LOW (ref 4.14–5.80)
RDW: 12.9 % (ref 11.6–15.4)
WBC: 7.8 10*3/uL (ref 3.4–10.8)

## 2023-05-07 DIAGNOSIS — J441 Chronic obstructive pulmonary disease with (acute) exacerbation: Secondary | ICD-10-CM | POA: Diagnosis not present

## 2023-05-07 DIAGNOSIS — J449 Chronic obstructive pulmonary disease, unspecified: Secondary | ICD-10-CM | POA: Diagnosis not present

## 2023-05-13 ENCOUNTER — Other Ambulatory Visit: Payer: Self-pay

## 2023-05-13 DIAGNOSIS — I724 Aneurysm of artery of lower extremity: Secondary | ICD-10-CM

## 2023-05-23 ENCOUNTER — Ambulatory Visit (HOSPITAL_COMMUNITY)
Admission: RE | Admit: 2023-05-23 | Discharge: 2023-05-23 | Disposition: A | Discharge status: Home / Self Care | Payer: Medicare Other | Source: Ambulatory Visit | Attending: Surgery | Admitting: Surgery

## 2023-05-23 ENCOUNTER — Ambulatory Visit: Payer: Medicare Other | Admitting: Surgery

## 2023-05-23 ENCOUNTER — Encounter: Payer: Self-pay | Admitting: Surgery

## 2023-05-23 VITALS — BP 152/74 | HR 55 | Temp 98.5°F | Ht 68.0 in | Wt 184.0 lb

## 2023-05-23 DIAGNOSIS — I7143 Infrarenal abdominal aortic aneurysm, without rupture: Secondary | ICD-10-CM

## 2023-05-23 DIAGNOSIS — I724 Aneurysm of artery of lower extremity: Secondary | ICD-10-CM | POA: Diagnosis not present

## 2023-05-23 NOTE — Progress Notes (Signed)
[facility]  Patient name: Joseph Shields. MRN: [id number] DOB: Jan 21, 1946 Sex: male   REASON FOR VISIT:    Follow up  HISOTRY OF PRESENT ILLNESS:    Joseph Shields. is a 78 y.o. 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  It measured 2.5 cm.  I sent him for CT scan to better define his anatomy.  On CT scan it measured 2.6 cm with circumferential thrombus.  I discussed that I would recommend endovascular repair due to the risk of embolic disease.  He wanted to think about this before scheduling.  He was also found to have a 3 cm abdominal aortic aneurysm on his CT scan.  Patient has a history of coronary artery disease. He is status post NSTEMI in 2015. He has a history of COPD from former tobacco abuse. He is on a statin for hypercholesterolemia.  PAST MEDICAL HISTORY:   Past Medical History:  Diagnosis Date   CAD (coronary artery disease)    a. 03/2014 NSTEMI - initially refused cath;  b. 03/2014 Echo: EF 55-60%;  c. 04/2014 Cath: LM nl, LAD 20-30p, 55m, D1 30-40, D2 small, 60-70, LCX nl, RCA 100p (CTO), L->R collaterals, EF 55-60%-->Med Rx.   COPD (chronic obstructive pulmonary disease) (HCC)    History of tobacco abuse    a. 40+ pack years, quit 03/2014.   Hyperlipidemia    Morbid obesity (HCC)    Psoriasis      FAMILY HISTORY:   Family History  Problem Relation Age of Onset   Stroke Mother 35   Allergies Mother    Asthma Mother    Cancer Mother        colon   Heart disease Father 73       quadruple bypass   Healthy Daughter    Healthy Daughter     SOCIAL HISTORY:   Social History   Tobacco Use   Smoking status: Former    Current packs/day: 0.00    Average packs/day: 0.5 packs/day for 49.0 years (24.5 ttl pk-yrs)    Types: Cigarettes    Start date: 04/07/1965    Quit date: 04/07/2014    Years since quitting: 9.1    Smokeless tobacco: Never  Substance Use Topics   Alcohol use: No    Alcohol/week: 0.0 standard drinks of alcohol     ALLERGIES:   Allergies  Allergen Reactions   Flonase [Fluticasone Propionate] Other (See Comments)    Nose bleeds.   Penicillins Other (See Comments)    REACTION: unknown     CURRENT MEDICATIONS:   Current Outpatient Medications  Medication Sig Dispense Refill   albuterol (VENTOLIN HFA) 108 (90 Base) MCG/ACT inhaler INHALE 2 PUFFS BY MOUTH EVERY 6 HOURS AS NEEDED FOR WHEEZING OR SHORTNESS OF BREATH 18 g 0   aspirin EC 81 MG tablet Take 81 mg by mouth daily.     augmented betamethasone dipropionate (DIPROLENE-AF) 0.05 % cream Apply topically 2 (two) times daily. At affected areas (avoid face and genitals) 45 each 1   Budeson-Glycopyrrol-Formoterol (BREZTRI AEROSPHERE) 160-9-4.8 MCG/ACT AERO Inhale 2 puffs into the lungs 2 (two) times daily. 32.1 g 3   cetirizine (ZYRTEC) 10 MG tablet Take 10 mg by mouth daily.     folic acid (FOLVITE) 1 MG tablet TAKE 1 TABLET EVERY DAY 90 tablet 0   metoprolol succinate (TOPROL-XL) 25 MG 24 hr tablet Take  1 tablet (25 mg total) by mouth daily. 90 tablet 3   rosuvastatin (CRESTOR) 20 MG tablet TAKE 1 TABLET EVERY DAY 90 tablet 0   No current facility-administered medications for this visit.    REVIEW OF SYSTEMS:   [X]  denotes positive finding, [ ]  denotes negative finding Cardiac  Comments:  Chest pain or chest pressure:    Shortness of breath upon exertion:    Short of breath when lying flat:    Irregular heart rhythm:        Vascular    Pain in calf, thigh, or hip brought on by ambulation:    Pain in feet at night that wakes you up from your sleep:     Blood clot in your veins:    Leg swelling:         Pulmonary    Oxygen at home:    Productive cough:     Wheezing:         Neurologic    Sudden weakness in arms or legs:     Sudden numbness in arms or legs:     Sudden onset of difficulty speaking or slurred  speech:    Temporary loss of vision in one eye:     Problems with dizziness:         Gastrointestinal    Blood in stool:     Vomited blood:         Genitourinary    Burning when urinating:     Blood in urine:        Psychiatric    Major depression:         Hematologic    Bleeding problems:    Problems with blood clotting too easily:        Skin    Rashes or ulcers:        Constitutional    Fever or chills:      PHYSICAL EXAM:   Vitals:   05/23/23 1459  BP: (!) 152/74  Pulse: (!) 55  Temp: 98.5 F (36.9 C)  SpO2: 90%  Weight: 184 lb (83.5 kg)  Height: 5\' 8"  (1.727 m)    GENERAL: The patient is a well-nourished male, in no acute distress. The vital signs are documented above. CARDIAC: There is a regular rate and rhythm.  VASCULAR: Palpable dorsalis pedis pulses bilaterally PULMONARY: Non-labored respirations ABDOMEN: Soft and non-tender with normal pitched bowel sounds.  MUSCULOSKELETAL: There are no major deformities or cyanosis. NEUROLOGIC: No focal weakness or paresthesias are detected. SKIN: There are no ulcers or rashes noted. PSYCHIATRIC: The patient has a normal affect.  STUDIES:   I have reviewed the following ultrasound: Left PoplitealAP (cm)Transv (cm)Waveform  StenosisShapeVelocity  (cm/s)  +--------------+-------+-----------+----------+--------+-----+-------------  --+  Proximal     0.69   0.76       monophasic             58                +--------------+-------+-----------+----------+--------+-----+-------------  --+  Mid          2.62   2.77       triphasic 75-99%       321               +--------------+-------+-----------+----------+--------+-----+-------------  --+  Distal       0.79   0.92       monophasic             111               +--------------+-------+-----------+----------+--------+-----+-------------  --+  Summary:  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 The distal femoral vein appears compressed by the aneurysm.   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  There is associated stenosis secondary to thrombus.  I have discussed with the patient and his wife that I think he is a very high risk for embolization which could potentially lead to leg amputation.  I think he would be a good candidate for endovascular repair.  I discussed this with them and he has decided to proceed.  This will be through a right femoral approach.  We discussed the risk of access site bleeding and distal embolization.  All questions were answered.  This been scheduled for February 11  AAA: Maximum diameter by CT scan was 3 cm.  He will need a surveillance ultrasound in 1 year.   Charlena Cross, MD, FACS [facility] [phone] Pager [phone]

## 2023-05-23 NOTE — H&P (View-Only) (Signed)
[facility] [place]  Patient name: Joseph Shields. MRN: [id number] DOB: [birth year] Sex: male   REASON FOR VISIT:    Follow up  HISOTRY OF PRESENT ILLNESS:    Joseph Shields. is a 78 y.o. 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  It measured 2.5 cm.  I sent him for CT scan to better define his anatomy.  On CT scan it measured 2.6 cm with circumferential thrombus.  I discussed that I would recommend endovascular repair due to the risk of embolic disease.  He wanted to think about this before scheduling.  He was also found to have a 3 cm abdominal aortic aneurysm on his CT scan.  Patient has a history of coronary artery disease. He is status post NSTEMI in 2015. He has a history of COPD from former tobacco abuse. He is on a statin for hypercholesterolemia.  PAST MEDICAL HISTORY:   Past Medical History:  Diagnosis Date   CAD (coronary artery disease)    a. 03/2014 NSTEMI - initially refused cath;  b. 03/2014 Echo: EF 55-60%;  c. 04/2014 Cath: LM nl, LAD 20-30p, 77m, D1 30-40, D2 small, 60-70, LCX nl, RCA 100p (CTO), L->R collaterals, EF 55-60%-->Med Rx.   COPD (chronic obstructive pulmonary disease) (HCC)    History of tobacco abuse    a. 40+ pack years, quit 03/2014.   Hyperlipidemia    Morbid obesity (HCC)    Psoriasis      FAMILY HISTORY:   Family History  Problem Relation Age of Onset   Stroke Mother 50   Allergies Mother    Asthma Mother    Cancer Mother        colon   Heart disease Father 60       quadruple bypass   Healthy Daughter    Healthy Daughter     SOCIAL HISTORY:   Social History   Tobacco Use   Smoking status: Former    Current packs/day: 0.00    Average packs/day: 0.5 packs/day for 49.0 years (24.5 ttl pk-yrs)    Types: Cigarettes    Start date: 04/07/1965    Quit date: 04/07/2014    Years since quitting: 9.1    Smokeless tobacco: Never  Substance Use Topics   Alcohol use: No    Alcohol/week: 0.0 standard drinks of alcohol     ALLERGIES:   Allergies  Allergen Reactions   Flonase [Fluticasone Propionate] Other (See Comments)    Nose bleeds.   Penicillins Other (See Comments)    REACTION: unknown     CURRENT MEDICATIONS:   Current Outpatient Medications  Medication Sig Dispense Refill   albuterol (VENTOLIN HFA) 108 (90 Base) MCG/ACT inhaler INHALE 2 PUFFS BY MOUTH EVERY 6 HOURS AS NEEDED FOR WHEEZING OR SHORTNESS OF BREATH 18 g 0   aspirin EC 81 MG tablet Take 81 mg by mouth daily.     augmented betamethasone dipropionate (DIPROLENE-AF) 0.05 % cream Apply topically 2 (two) times daily. At affected areas (avoid face and genitals) 45 each 1   Budeson-Glycopyrrol-Formoterol (BREZTRI AEROSPHERE) 160-9-4.8 MCG/ACT AERO Inhale 2 puffs into the lungs 2 (two) times daily. 32.1 g 3   cetirizine (ZYRTEC) 10 MG tablet Take 10 mg by mouth daily.     folic acid (FOLVITE) 1 MG tablet TAKE 1 TABLET EVERY DAY 90 tablet 0   metoprolol succinate (TOPROL-XL) 25 MG 24 hr tablet Take  1 tablet (25 mg total) by mouth daily. 90 tablet 3   rosuvastatin (CRESTOR) 20 MG tablet TAKE 1 TABLET EVERY DAY 90 tablet 0   No current facility-administered medications for this visit.    REVIEW OF SYSTEMS:   [X]  denotes positive finding, [ ]  denotes negative finding Cardiac  Comments:  Chest pain or chest pressure:    Shortness of breath upon exertion:    Short of breath when lying flat:    Irregular heart rhythm:        Vascular    Pain in calf, thigh, or hip brought on by ambulation:    Pain in feet at night that wakes you up from your sleep:     Blood clot in your veins:    Leg swelling:         Pulmonary    Oxygen at home:    Productive cough:     Wheezing:         Neurologic    Sudden weakness in arms or legs:     Sudden numbness in arms or legs:     Sudden onset of difficulty speaking or slurred  speech:    Temporary loss of vision in one eye:     Problems with dizziness:         Gastrointestinal    Blood in stool:     Vomited blood:         Genitourinary    Burning when urinating:     Blood in urine:        Psychiatric    Major depression:         Hematologic    Bleeding problems:    Problems with blood clotting too easily:        Skin    Rashes or ulcers:        Constitutional    Fever or chills:      PHYSICAL EXAM:   Vitals:   05/23/23 1459  BP: (!) 152/74  Pulse: (!) 55  Temp: 98.5 F (36.9 C)  SpO2: 90%  Weight: 184 lb (83.5 kg)  Height: 5\' 8"  (1.727 m)    GENERAL: The patient is a well-nourished male, in no acute distress. The vital signs are documented above. CARDIAC: There is a regular rate and rhythm.  VASCULAR: Palpable dorsalis pedis pulses bilaterally PULMONARY: Non-labored respirations ABDOMEN: Soft and non-tender with normal pitched bowel sounds.  MUSCULOSKELETAL: There are no major deformities or cyanosis. NEUROLOGIC: No focal weakness or paresthesias are detected. SKIN: There are no ulcers or rashes noted. PSYCHIATRIC: The patient has a normal affect.  STUDIES:   I have reviewed the following ultrasound: Left PoplitealAP (cm)Transv (cm)Waveform  StenosisShapeVelocity  (cm/s)  +--------------+-------+-----------+----------+--------+-----+-------------  --+  Proximal     0.69   0.76       monophasic             58                +--------------+-------+-----------+----------+--------+-----+-------------  --+  Mid          2.62   2.77       triphasic 75-99%       321               +--------------+-------+-----------+----------+--------+-----+-------------  --+  Distal       0.79   0.92       monophasic             111               +--------------+-------+-----------+----------+--------+-----+-------------  --+  Summary:  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 The distal femoral vein appears compressed by the aneurysm.   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  There is associated stenosis secondary to thrombus.  I have discussed with the patient and his wife that I think he is a very high risk for embolization which could potentially lead to leg amputation.  I think he would be a good candidate for endovascular repair.  I discussed this with them and he has decided to proceed.  This will be through a right femoral approach.  We discussed the risk of access site bleeding and distal embolization.  All questions were answered.  This been scheduled for February 11  AAA: Maximum diameter by CT scan was 3 cm.  He will need a surveillance ultrasound in 1 year.   Charlena Cross, MD, FACS [facility] [phone] Pager [phone]

## 2023-05-25 ENCOUNTER — Telehealth: Payer: Self-pay

## 2023-05-25 NOTE — Telephone Encounter (Signed)
Attempted to reach pt to schedule his AGM on 2/11. Left VM for him to return our call.

## 2023-05-26 ENCOUNTER — Other Ambulatory Visit: Payer: Self-pay

## 2023-05-26 DIAGNOSIS — I724 Aneurysm of artery of lower extremity: Secondary | ICD-10-CM

## 2023-05-30 DIAGNOSIS — L570 Actinic keratosis: Secondary | ICD-10-CM | POA: Diagnosis not present

## 2023-05-30 DIAGNOSIS — Z85828 Personal history of other malignant neoplasm of skin: Secondary | ICD-10-CM | POA: Diagnosis not present

## 2023-05-30 DIAGNOSIS — L821 Other seborrheic keratosis: Secondary | ICD-10-CM | POA: Diagnosis not present

## 2023-05-30 DIAGNOSIS — Z08 Encounter for follow-up examination after completed treatment for malignant neoplasm: Secondary | ICD-10-CM | POA: Diagnosis not present

## 2023-06-07 ENCOUNTER — Encounter (HOSPITAL_COMMUNITY): Payer: Self-pay | Admitting: Surgery

## 2023-06-07 ENCOUNTER — Other Ambulatory Visit: Payer: Self-pay

## 2023-06-07 ENCOUNTER — Encounter (HOSPITAL_COMMUNITY)
Admission: RE | Disposition: A | Discharge status: Home / Self Care | Payer: Self-pay | Source: Home / Self Care | Attending: Surgery

## 2023-06-07 ENCOUNTER — Ambulatory Visit (HOSPITAL_COMMUNITY)
Admission: RE | Admit: 2023-06-07 | Discharge: 2023-06-07 | Disposition: A | Discharge status: Home / Self Care | Payer: Medicare Other | Attending: Surgery | Admitting: Surgery

## 2023-06-07 DIAGNOSIS — Z87891 Personal history of nicotine dependence: Secondary | ICD-10-CM | POA: Insufficient documentation

## 2023-06-07 DIAGNOSIS — I724 Aneurysm of artery of lower extremity: Secondary | ICD-10-CM | POA: Insufficient documentation

## 2023-06-07 DIAGNOSIS — I252 Old myocardial infarction: Secondary | ICD-10-CM | POA: Diagnosis not present

## 2023-06-07 DIAGNOSIS — I2510 Atherosclerotic heart disease of native coronary artery without angina pectoris: Secondary | ICD-10-CM | POA: Insufficient documentation

## 2023-06-07 DIAGNOSIS — Z79899 Other long term (current) drug therapy: Secondary | ICD-10-CM | POA: Diagnosis not present

## 2023-06-07 DIAGNOSIS — J449 Chronic obstructive pulmonary disease, unspecified: Secondary | ICD-10-CM | POA: Diagnosis not present

## 2023-06-07 DIAGNOSIS — J441 Chronic obstructive pulmonary disease with (acute) exacerbation: Secondary | ICD-10-CM | POA: Diagnosis not present

## 2023-06-07 DIAGNOSIS — I714 Abdominal aortic aneurysm, without rupture, unspecified: Secondary | ICD-10-CM | POA: Diagnosis not present

## 2023-06-07 DIAGNOSIS — E7800 Pure hypercholesterolemia, unspecified: Secondary | ICD-10-CM | POA: Diagnosis not present

## 2023-06-07 HISTORY — PX: ABDOMINAL AORTOGRAM W/LOWER EXTREMITY: CATH118223

## 2023-06-07 HISTORY — PX: ENDOVASCULAR REPAIR OF POPLITEAL ARTERY ANEURYSM: CATH118374

## 2023-06-07 HISTORY — PX: PERIPHERAL VASCULAR ULTRASOUND/IVUS: CATH118334

## 2023-06-07 LAB — POCT I-STAT, CHEM 8
BUN: 17 mg/dL (ref 8–23)
Calcium, Ion: 1.18 mmol/L (ref 1.15–1.40)
Chloride: 105 mmol/L (ref 98–111)
Creatinine, Ser: 1 mg/dL (ref 0.61–1.24)
Glucose, Bld: 82 mg/dL (ref 70–99)
HCT: 41 % (ref 39.0–52.0)
Hemoglobin: 13.9 g/dL (ref 13.0–17.0)
Potassium: 3.8 mmol/L (ref 3.5–5.1)
Sodium: 140 mmol/L (ref 135–145)
TCO2: 25 mmol/L (ref 22–32)

## 2023-06-07 LAB — POCT ACTIVATED CLOTTING TIME: Activated Clotting Time: 199 s

## 2023-06-07 SURGERY — ABDOMINAL AORTOGRAM W/LOWER EXTREMITY
Anesthesia: LOCAL

## 2023-06-07 MED ORDER — MIDAZOLAM HCL 2 MG/2ML IJ SOLN
INTRAMUSCULAR | Status: AC
  Filled 2023-06-07: qty 2

## 2023-06-07 MED ORDER — OXYCODONE HCL 5 MG PO TABS: 5.0000 mg | ORAL_TABLET | ORAL | Status: DC | PRN

## 2023-06-07 MED ORDER — ACETAMINOPHEN 325 MG PO TABS: 650.0000 mg | ORAL_TABLET | ORAL | Status: DC | PRN

## 2023-06-07 MED ORDER — FENTANYL CITRATE (PF) 100 MCG/2ML IJ SOLN
INTRAMUSCULAR | Status: AC
  Filled 2023-06-07: qty 2

## 2023-06-07 MED ORDER — SODIUM CHLORIDE 0.9% FLUSH: 3.0000 mL | INTRAVENOUS | Status: DC | PRN

## 2023-06-07 MED ORDER — HEPARIN (PORCINE) IN NACL 1000-0.9 UT/500ML-% IV SOLN
INTRAVENOUS | Status: DC | PRN
Start: 2023-06-07 — End: 2023-06-07
  Administered 2023-06-07 (×2): 500 mL

## 2023-06-07 MED ORDER — ONDANSETRON HCL 4 MG/2ML IJ SOLN: 4.0000 mg | Freq: Four times a day (QID) | INTRAMUSCULAR | Status: DC | PRN

## 2023-06-07 MED ORDER — MORPHINE SULFATE (PF) 2 MG/ML IV SOLN: 2.0000 mg | INTRAVENOUS | Status: DC | PRN

## 2023-06-07 MED ORDER — SODIUM CHLORIDE 0.9 % IV SOLN: 250.0000 mL | INTRAVENOUS | Status: DC | PRN

## 2023-06-07 MED ORDER — LIDOCAINE HCL (PF) 1 % IJ SOLN
INTRAMUSCULAR | Status: DC | PRN
  Administered 2023-06-07: 15 mL via INTRADERMAL

## 2023-06-07 MED ORDER — CLOPIDOGREL BISULFATE 75 MG PO TABS: 75.0000 mg | ORAL_TABLET | Freq: Every day | ORAL | 11 refills | Status: DC

## 2023-06-07 MED ORDER — FENTANYL CITRATE (PF) 100 MCG/2ML IJ SOLN
INTRAMUSCULAR | Status: DC | PRN
  Administered 2023-06-07 (×2): 25 ug via INTRAVENOUS
  Administered 2023-06-07: 50 ug
  Administered 2023-06-07: 50 ug via INTRAVENOUS

## 2023-06-07 MED ORDER — PROTAMINE SULFATE 10 MG/ML IV SOLN
INTRAVENOUS | Status: AC
Start: 2023-06-07 — End: ?
  Filled 2023-06-07: qty 5

## 2023-06-07 MED ORDER — HEPARIN SODIUM (PORCINE) 1000 UNIT/ML IJ SOLN
INTRAMUSCULAR | Status: DC | PRN
  Administered 2023-06-07: 3000 [IU] via INTRAVENOUS
  Administered 2023-06-07: 8500 [IU] via INTRAVENOUS

## 2023-06-07 MED ORDER — ASPIRIN 81 MG PO TBEC: 81.0000 mg | DELAYED_RELEASE_TABLET | Freq: Every day | ORAL | Status: DC

## 2023-06-07 MED ORDER — CLOPIDOGREL BISULFATE 75 MG PO TABS: 75.0000 mg | ORAL_TABLET | Freq: Every day | ORAL | Status: DC

## 2023-06-07 MED ORDER — IODIXANOL 320 MG/ML IV SOLN
INTRAVENOUS | Status: DC | PRN
  Administered 2023-06-07: 65 mL

## 2023-06-07 MED ORDER — HEPARIN SODIUM (PORCINE) 1000 UNIT/ML IJ SOLN
INTRAMUSCULAR | Status: AC
  Filled 2023-06-07: qty 10

## 2023-06-07 MED ORDER — SODIUM CHLORIDE 0.9% FLUSH: 3.0000 mL | Freq: Two times a day (BID) | INTRAVENOUS | Status: DC

## 2023-06-07 MED ORDER — HYDRALAZINE HCL 20 MG/ML IJ SOLN: 5.0000 mg | INTRAMUSCULAR | Status: DC | PRN

## 2023-06-07 MED ORDER — MIDAZOLAM HCL 2 MG/2ML IJ SOLN
INTRAMUSCULAR | Status: DC | PRN
  Administered 2023-06-07 (×2): 1 mg via INTRAVENOUS
  Administered 2023-06-07: 2 mg via INTRAVENOUS

## 2023-06-07 MED ORDER — LIDOCAINE HCL (PF) 1 % IJ SOLN
INTRAMUSCULAR | Status: AC
  Filled 2023-06-07: qty 30

## 2023-06-07 MED ORDER — SODIUM CHLORIDE 0.9 % WEIGHT BASED INFUSION
1.0000 mL/kg/h | INTRAVENOUS | Status: DC
  Administered 2023-06-07: 250 mL via INTRAVENOUS

## 2023-06-07 MED ORDER — LABETALOL HCL 5 MG/ML IV SOLN: 10.0000 mg | INTRAVENOUS | Status: DC | PRN

## 2023-06-07 MED ORDER — PROTAMINE SULFATE 10 MG/ML IV SOLN
INTRAVENOUS | Status: DC | PRN
  Administered 2023-06-07: 5 mg via INTRAVENOUS

## 2023-06-07 MED ORDER — SODIUM CHLORIDE 0.9 % IV SOLN: INTRAVENOUS | Status: DC

## 2023-06-07 SURGICAL SUPPLY — 19 items
BALLN STERLING OTW 7X100X135 (BALLOONS) ×2
BALLOON STERLING OTW 7X100X135 (BALLOONS) IMPLANT
CATH NAVICROSS ANGLED 90CM (MICROCATHETER) IMPLANT
CATH OMNI FLUSH 5F 65CM (CATHETERS) IMPLANT
CATH OPTICROSS 18 (CATHETERS) IMPLANT
CLOSURE PERCLOSE PROSTYLE (VASCULAR PRODUCTS) IMPLANT
GUIDEWIRE ANGLED .035X260CM (WIRE) IMPLANT
KIT ENCORE 26 ADVANTAGE (KITS) IMPLANT
KIT MICROPUNCTURE NIT STIFF (SHEATH) IMPLANT
KIT SYRINGE INJ CVI SPIKEX1 (MISCELLANEOUS) IMPLANT
SET ATX-X65L (MISCELLANEOUS) IMPLANT
SHEATH CATAPULT 7FR 45 (SHEATH) IMPLANT
SHEATH PINNACLE 5F 10CM (SHEATH) IMPLANT
SHEATH PROBE COVER 6X72 (BAG) IMPLANT
STENT VIABAHN 8X15X120 7FR (Permanent Stent) IMPLANT
SYSTEM COMPRESSION FEMOSTOP (HEMOSTASIS) IMPLANT
TRAY PV CATH (CUSTOM PROCEDURE TRAY) ×2 IMPLANT
WIRE BENTSON .035X145CM (WIRE) IMPLANT
WIRE G V18X300CM (WIRE) IMPLANT

## 2023-06-07 NOTE — Progress Notes (Signed)
Patient and wife was given discharge instructions. Both verbalized understanding.

## 2023-06-07 NOTE — Op Note (Signed)
Patient name: Joseph Shields. MRN: [id number] DOB: [birth year] Sex: male  06/07/2023 Pre-operative Diagnosis: Left popliteal aneurysm Post-operative diagnosis:  Same Surgeon:  Durene Cal Procedure Performed:  1.  Ultrasound-guided access, right femoral artery  2.  Abdominal aortogram  3.  Selective injection with cath in the left superficial femoral artery  4.  Left leg angiogram  5.  Endovascular pair of left popliteal aneurysm (left popliteal stent)  6.  IVUS (intravascular ultrasound, left common femoral, superficial femoral, and popliteal artery)  7.  Conscious sedation, 79 minutes  8.  Closure device, Pro-glide   Indications: This is a 78 year old gentleman who was found to have a left popliteal aneurysm with significant amount of mural thrombus and associated stenosis.  He comes in today for repair  Procedure:  The patient was identified in the holding area and taken to room 8.  The patient was then placed supine on the table and prepped and draped in the usual sterile fashion.  A time out was called.  Conscious sedation was administered with the use of IV fentanyl and Versed under continuous physician and nurse monitoring.  Heart rate, blood pressure, and oxygen saturation were continuously monitored.  Total sedation time was 79 minutes.  Ultrasound was used to evaluate the left common femoral artery.  It was patent .  A digital ultrasound image was acquired.  A micropuncture needle was used to access the left common femoral artery under ultrasound guidance.  An 018 wire was advanced without resistance and a micropuncture sheath was placed.  The 018 wire was removed and a benson wire was placed.  The micropuncture sheath was exchanged for a 5 french sheath.  An omniflush catheter was advanced over the wire to the level of L-1.  An abdominal angiogram was obtained.  Next, using the Omni Flush catheter, a Glidewire and a Nava cross catheter, the catheter was placed into the left  superficial femoral artery and left leg angiogram was performed Findings:   Aortogram: No significant renal artery stenosis visualized.  Ectatic infrarenal abdominal aorta.  No obvious iliac artery stenosis  Right Lower Extremity: Not evaluated  Left Lower Extremity: The left common femoral artery and profundofemoral artery are widely patent.  The superficial femoral artery has multiple areas of luminal irregularity without associated stenosis and to get into the adductor canal where it is heavily calcified with aneurysmal change.  The below-knee popliteal artery is widely patent with three-vessel runoff  Intervention: After the above images were acquired the decision was made to proceed with intervention.  A 7 French 45 cm sheath was advanced over the aortic bifurcation into the left external iliac artery.  The patient was fully heparinized.  I then advanced a V-18 wire into the below-knee popliteal artery.  IVUS catheter was then selected to evaluate and measure the left popliteal, superficial femoral, and common femoral artery.  Once I determine the appropriate location for stenting, a 8 x 150 Viabahn stent was deployed into the above-knee popliteal artery at the level patella.  It was then postdilated with a 7 mm balloon.  There was a high-grade stenosis in the adductor canal region of the stent that resolved after angioplasty completion imaging revealed successful exclusion of the aneurysm with no change in runoff.  At this point I elected to close the groin with a ProGlide.  The patient did develop a hematoma which was controlled with manual pressure  Impression:  #1  Successful endovascular repair of left popliteal aneurysm using  a Viabahn 8 x 150   V. Durene Cal, M.D., [facility] [place] Office: [phone] Pager:  [phone]

## 2023-06-07 NOTE — Progress Notes (Signed)
Pt right groin the FemStop was removed, soft to touch and bruised.  Dr Myra Gianotti in to se patient and assess groin.

## 2023-06-07 NOTE — Interval H&P Note (Signed)
History and Physical Interval Note:  06/07/2023 8:23 PM  Joseph Shields.  has presented today for surgery, with the diagnosis of left popliteal aneurysm.  The various methods of treatment have been discussed with the patient and family. After consideration of risks, benefits and other options for treatment, the patient has consented to  Procedure(s) with comments: ABDOMINAL AORTOGRAM W/LOWER EXTREMITY (N/A) Peripheral Vascular Ultrasound/IVUS ENDOVASCULAR REPAIR OF POPLITEAL ARTERY ANEURYSM - stent and angioplasty to left popliteal as a surgical intervention.  The patient's history has been reviewed, patient examined, no change in status, stable for surgery.  I have reviewed the patient's chart and labs.  Questions were answered to the patient's satisfaction.     Durene Cal

## 2023-06-07 NOTE — Progress Notes (Signed)
Right groin with Femstop at 125mm/hg decreased to 60 to hear distal pulses via doppler

## 2023-06-10 ENCOUNTER — Telehealth: Payer: Self-pay

## 2023-06-10 NOTE — Telephone Encounter (Signed)
Triage call/Advice:  -pt sent messages in chart with c/o of his leg being sore. -pt reports some swelling at his ankle and states it feels better since the procedure.  He affirms leg is warm and not discolored.  -pt inquired about if he is suppose to take the Plavix he was prescribed.  Confirmed he should take as directed.

## 2023-06-14 ENCOUNTER — Ambulatory Visit: Payer: Medicare HMO | Admitting: Internal Medicine

## 2023-06-20 ENCOUNTER — Other Ambulatory Visit (HOSPITAL_COMMUNITY): Payer: Self-pay | Admitting: Surgery

## 2023-06-20 ENCOUNTER — Ambulatory Visit (HOSPITAL_COMMUNITY)
Admission: RE | Admit: 2023-06-20 | Discharge: 2023-06-20 | Disposition: A | Discharge status: Home / Self Care | Payer: Medicare Other | Source: Ambulatory Visit | Attending: Surgery | Admitting: Surgery

## 2023-06-20 ENCOUNTER — Telehealth: Payer: Self-pay

## 2023-06-20 ENCOUNTER — Ambulatory Visit: Payer: Medicare Other | Admitting: Physician Assistant

## 2023-06-20 VITALS — BP 147/74 | HR 63 | Temp 98.5°F | Resp 18 | Ht 68.0 in | Wt 186.5 lb

## 2023-06-20 DIAGNOSIS — I724 Aneurysm of artery of lower extremity: Secondary | ICD-10-CM | POA: Diagnosis not present

## 2023-06-20 DIAGNOSIS — I729 Aneurysm of unspecified site: Secondary | ICD-10-CM | POA: Insufficient documentation

## 2023-06-20 NOTE — Progress Notes (Signed)
 Office Note     CC:  follow up Requesting Provider:  Mechele Claude, MD  HPI: Joseph Shields. is a 78 y.o. (01-01-1946) male who presents status post aortogram with left lower extremity focus with endovascular repair of the left popliteal artery aneurysm by Dr. Myra Gianotti on 06/07/2023.  Right groin access site was closed with a Pro-glide.  Patient and his wife return to clinic today as an urgent add-on due to bruising of the right groin and entire right lower extremity including the toes and dorsal foot.  He is also experiencing right lower extremity edema to the level of the knee.  He denies any pain, lack of motor, or lack of sensation in the right foot.  Right groin access site remains tender.  He denies any problems with the left leg which was the side of interest.  He is on aspirin, statin, Plavix daily.   Past Medical History:  Diagnosis Date   CAD (coronary artery disease)    a. 03/2014 NSTEMI - initially refused cath;  b. 03/2014 Echo: EF 55-60%;  c. 04/2014 Cath: LM nl, LAD 20-30p, 28m, D1 30-40, D2 small, 60-70, LCX nl, RCA 100p (CTO), L->R collaterals, EF 55-60%-->Med Rx.   COPD (chronic obstructive pulmonary disease) (HCC)    History of tobacco abuse    a. 40+ pack years, quit 03/2014.   Hyperlipidemia    Morbid obesity (HCC)    Peripheral arterial disease (HCC)    Peripheral vascular disease (HCC)    Psoriasis     Past Surgical History:  Procedure Laterality Date   ABDOMINAL AORTOGRAM W/LOWER EXTREMITY N/A 06/07/2023   Procedure: ABDOMINAL AORTOGRAM W/LOWER EXTREMITY;  Surgeon: Nada Libman, MD;  Location: [facility] INVASIVE CV LAB;  Service: Cardiovascular;  Laterality: N/A;   CATARACT EXTRACTION, BILATERAL     ENDOVASCULAR REPAIR OF POPLITEAL ARTERY ANEURYSM  06/07/2023   Procedure: ENDOVASCULAR REPAIR OF POPLITEAL ARTERY ANEURYSM;  Surgeon: Nada Libman, MD;  Location: [facility] INVASIVE CV LAB;  Service: Cardiovascular;;  stent and angioplasty to left popliteal   LEFT HEART  CATHETERIZATION WITH CORONARY ANGIOGRAM N/A 05/06/2014   Procedure: LEFT HEART CATHETERIZATION WITH CORONARY ANGIOGRAM;  Surgeon: Marykay Lex, MD;  [facility]ATH LAB;  Service: Cardiovascular;  Laterality: N/A;   PERIPHERAL VASCULAR ULTRASOUND/IVUS  06/07/2023   Procedure: Peripheral Vascular Ultrasound/IVUS;  Surgeon: Nada Libman, MD;  Location: [facility] INVASIVE CV LAB;  Service: Cardiovascular;;   SPINE SURGERY      Social History   Socioeconomic History   Marital status: Married    Spouse name: Joseph Shields   Number of children: 2   Years of education: associates degree   Highest education level: Associate degree: occupational, Scientist, product/process development, or vocational program  Occupational History   Occupation: Retired    Comment: Wellsite geologist  Tobacco Use   Smoking status: Former    Current packs/day: 0.00    Average packs/day: 0.5 packs/day for 49.0 years (24.5 ttl pk-yrs)    Types: Cigarettes    Start date: 04/07/1965    Quit date: 04/07/2014    Years since quitting: 9.2   Smokeless tobacco: Never  Vaping Use   Vaping status: Never Used  Substance and Sexual Activity   Alcohol use: No    Alcohol/week: 0.0 standard drinks of alcohol   Drug use: No   Sexual activity: Yes  Other Topics Concern   Not on file  Social History Narrative   Lives home with wife. Children live nearby   Social  Drivers of Health   Financial Resource Strain: Low Risk  (06/16/2022)   Overall Financial Resource Strain (CARDIA)    Difficulty of Paying Living Expenses: Not hard at all  Food Insecurity: No Food Insecurity (06/16/2022)   Hunger Vital Sign    Worried About Running Out of Food in the Last Year: Never true    Ran Out of Food in the Last Year: Never true  Transportation Needs: No Transportation Needs (06/16/2022)   PRAPARE - Administrator, Civil Service (Medical): No    Lack of Transportation (Non-Medical): No  Physical Activity: Inactive (06/16/2022)   Exercise Vital Sign     Days of Exercise per Week: 0 days    Minutes of Exercise per Session: 0 min  Stress: No Stress Concern Present (06/16/2022)   Joseph Shields of Occupational Health - Occupational Stress Questionnaire    Feeling of Stress : Not at all  Social Connections: Moderately Integrated (06/16/2022)   Social Connection and Isolation Panel [NHANES]    Frequency of Communication with Friends and Family: More than three times a week    Frequency of Social Gatherings with Friends and Family: More than three times a week    Attends Religious Services: More than 4 times per year    Active Member of [employer] or Organizations: No    Attends Banker Meetings: Never    Marital Status: Married  Catering manager Violence: Not At Risk (06/16/2022)   Humiliation, Afraid, Rape, and Kick questionnaire    Fear of Current or Ex-Partner: No    Emotionally Abused: No    Physically Abused: No    Sexually Abused: No    Family History  Problem Relation Age of Onset   Stroke Mother 18   Allergies Mother    Asthma Mother    Cancer Mother        colon   Heart disease Father 77       quadruple bypass   Healthy Daughter    Healthy Daughter     Current Outpatient Medications  Medication Sig Dispense Refill   acetaminophen (TYLENOL) 500 MG tablet Take 500 mg by mouth every 6 (six) hours as needed for moderate pain (pain score 4-6).     albuterol (VENTOLIN HFA) 108 (90 Base) MCG/ACT inhaler INHALE 2 PUFFS BY MOUTH EVERY 6 HOURS AS NEEDED FOR WHEEZING OR SHORTNESS OF BREATH 18 g 0   aspirin EC 81 MG tablet Take 81 mg by mouth daily.     Budeson-Glycopyrrol-Formoterol (BREZTRI AEROSPHERE) 160-9-4.8 MCG/ACT AERO Inhale 2 puffs into the lungs 2 (two) times daily. 32.1 g 3   cetirizine (ZYRTEC) 10 MG tablet Take 10 mg by mouth daily.     clopidogrel (PLAVIX) 75 MG tablet Take 1 tablet (75 mg total) by mouth daily. 30 tablet 11   folic acid (FOLVITE) 1 MG tablet TAKE 1 TABLET EVERY DAY 90 tablet 0    metoprolol succinate (TOPROL-XL) 25 MG 24 hr tablet Take 1 tablet (25 mg total) by mouth daily. 90 tablet 3   rosuvastatin (CRESTOR) 20 MG tablet TAKE 1 TABLET EVERY DAY 90 tablet 0   triamcinolone ointment (KENALOG) 0.1 % Apply 1 Application topically 2 (two) times daily as needed (psoriasis).     No current facility-administered medications for this visit.    Allergies  Allergen Reactions   Flonase [Fluticasone Propionate] Other (See Comments)    Nose bleeds.   Penicillins Other (See Comments)    unknown  REVIEW OF SYSTEMS:   [X]  denotes positive finding, [ ]  denotes negative finding Cardiac  Comments:  Chest pain or chest pressure:    Shortness of breath upon exertion:    Short of breath when lying flat:    Irregular heart rhythm:        Vascular    Pain in calf, thigh, or hip brought on by ambulation:    Pain in feet at night that wakes you up from your sleep:     Blood clot in your veins:    Leg swelling:         Pulmonary    Oxygen at home:    Productive cough:     Wheezing:         Neurologic    Sudden weakness in arms or legs:     Sudden numbness in arms or legs:     Sudden onset of difficulty speaking or slurred speech:    Temporary loss of vision in one eye:     Problems with dizziness:         Gastrointestinal    Blood in stool:     Vomited blood:         Genitourinary    Burning when urinating:     Blood in urine:        Psychiatric    Major depression:         Hematologic    Bleeding problems:    Problems with blood clotting too easily:        Skin    Rashes or ulcers:        Constitutional    Fever or chills:      PHYSICAL EXAMINATION:  Vitals:   06/20/23 1436  BP: (!) 147/74  Pulse: 63  Resp: 18  Temp: 98.5 F (36.9 C)  TempSrc: Temporal  SpO2: 91%  Weight: 186 lb 8 oz (84.6 kg)  Height: 5\' 8"  (1.727 m)    General:  WDWN in NAD; vital signs documented above Gait: Not observed HENT: WNL, normocephalic Pulmonary:  normal non-labored breathing , without Rales, rhonchi,  wheezing Cardiac: regular HR Abdomen: soft, NT, no masses Skin: without rashes Vascular Exam/Pulses: Palpable left DP pulse; palpable right PT pulse; pulsatile fullness in the right groin Extremities: Pitting edema of the right leg up to the proximal shin with ecchymosis involving toes, dorsal foot, right thigh, and groin Musculoskeletal: no muscle wasting or atrophy  Neurologic: A&O X 3 Psychiatric:  The pt has Normal affect.   Non-Invasive Vascular Imaging:   Ultrasound of the right groin was negative for pseudoaneurysm.  He does have a sizable hematoma    ASSESSMENT/PLAN:: 78 y.o. male status post aortogram with endovascular repair of the left popliteal artery aneurysm; subsequent right groin fullness with ecchymosis of the entire right leg into the foot  Mr. Shawna Kiener is a 78 year old male who underwent endovascular repair of left popliteal artery aneurysm on 06/07/2023 by Dr. Myra Gianotti.  Right groin was closed with Pro-glide.  He developed right groin fullness and subsequent bruising involving right thigh, lower leg, dorsal foot, and toes.  He denies any ischemic symptoms of the foot and has a palpable PT pulse.  Discoloration of the toes is not circumferential.  Right groin ultrasound performed in the office today was negative for pseudoaneurysm.  He does have a sizable hematoma.  He was assured bruising, swelling, and hematoma will resolve with time.  It should also be noted that the left foot is well-perfused  with a palpable DP pulse.  There is no discoloration or tissue loss of the left lower extremity on exam.  He is scheduled for left lower extremity arterial duplex and ABI at the end of March.   Emilie Rutter, PA-C [facility] [phone]  Clinic MD:   Myra Gianotti

## 2023-06-20 NOTE — Telephone Encounter (Signed)
 Triage: -pt called/left message stating leg was swollen in the ankle and foot and wants come to the office so we can look at it.  They are in [place] today. -reviewed chart.  Returned call to pt who states someone has to look at this leg, its not the leg they fixed its the right one, stating its where they went in at to fix the left one.  He states he was bleeding afterwards and they were pushing on my groin, that it hurt.  He states his leg is purple all the way down to his ankle, that the swelling has actually a little better but his leg "has a fever".

## 2023-06-23 ENCOUNTER — Other Ambulatory Visit: Payer: Self-pay | Admitting: *Deleted

## 2023-06-23 DIAGNOSIS — I739 Peripheral vascular disease, unspecified: Secondary | ICD-10-CM

## 2023-06-24 ENCOUNTER — Telehealth: Payer: Self-pay

## 2023-06-24 NOTE — Telephone Encounter (Signed)
 Advice:  -pt's wife called, LM stating her husband's leg is swelling more and its the worst its ever been.  She saw a PA on Monday and wants a "DOCTOR!!!" -returned call and spoke to both pt and wife in a disorganized conversation, who said well Kealii really did like that PA the other day, his leg was about to pop, I can't wear those compression hose-they hurt, he better not loose his leg, and can he do that exercise bike thing like she does while sitting in a wheel chair?   -pt and wife educated on elevating his leg, wearing compress hose & how to put them on, exercising.   -Pt states he will see how the weekend goes and call us first of the week if he has any problems

## 2023-07-05 DIAGNOSIS — J441 Chronic obstructive pulmonary disease with (acute) exacerbation: Secondary | ICD-10-CM | POA: Diagnosis not present

## 2023-07-05 DIAGNOSIS — J449 Chronic obstructive pulmonary disease, unspecified: Secondary | ICD-10-CM | POA: Diagnosis not present

## 2023-07-17 NOTE — Progress Notes (Unsigned)
 Subjective:   Patient ID: Joseph Shields, male    DOB: January 15, 1946   MRN: [id number]    Brief patient profile:  58 yowm  Quit Smoking  04/07/14 when admit with dx of pna and better since discharge to point where can now get up the hill to feed the dog but at one point couldn't make it up the hill and self referred to pulmonary 05/21/2014 for eval and proved to have GOLD III copd/ mildly reversible  Admit date: 04/13/2014 Discharge date: 04/15/2014  Discharge Diagnoses:     SIRS   Acute respiratory failure with hypoxia due to acute bronchitis/failed OP therapy   NSTEMI (non-ST elevated myocardial infarction)/refused IP cath   Psoriasis   Smoker    Cough due to acute bronchitis      History of present illness:   78 year old male patient with history of psoriasis on methotrexate. Presented with complaints of cough and fever. Had initially been evaluated by his primary care physician and was started on azithromycin and cough suppressant therapy but after 3 days of treatment symptoms were worsening with dyspnea on exertion and extreme fatigue.  Evaluation in the ER revealed a mildly hypertensive patient with tachycardia.  He was afebrile with room air sats dipping to 83-84%. He had a white count of 20,300. Chest x-ray was suggestive of bronchitis. He also had elevated troponin at 3.79, total CK of 428, and a CKMB of 29. Patient had a prolonged stay in the ER and a second troponin was elevated at 6.16. EKG was nonischemic and only demonstrated sinus tachycardia with nonspecific ST abnormality. Cardiology was consulted. A stat echo was completed in the ER which revealed normal LVEF without any obvious wall motion abnormalities. CT of the chest had also been accomplished and demonstrated coronary artery calcifications. No indications for emergent catheterization per Cardiology. Because of active wheezing at time of presentation beta blockers were not initiated.   Hospital Course:     NSTEMI Evaluated by cardiology this admission. Initially no beta blockers due to active wheezing at presentation. Heparin drip was initiated. Lipid panel revealed suboptimal HDL at 28. Statin therapy initiated. Cardiology offered cardiac catheterization to the patient but he refused. He is amenable to proceeding with outpatient cardiac catheterization. Patient was ambulated on room air to determine if oxygen therapy would be indicated at discharge. He did not have any chest pain or shortness of breath with ambulation even when O2 sat 83%. Cardiac enzymes trended downward last troponin was 4.21 on 12/20. Our recommendation is that the patient follow-up with cardiology as an outpatient to pursue cardiac catheterization once current respiratory symptoms have resolved. Since no wheezing on date of discharge we have ordered a selective beta blocker carvedilol to begin at 3.125 mg twice a day.  Acute hypoxemic respiratory failure secondary to bronchitis Primary reason for presentation. Was hypoxemic with sats in the mid 80s in the ER. Had previously been treated with 3 days of Zithromax without improvement in symptoms. Started on Levaquin this admission which we'll continue for total of 7 days after discharge. Sputum culture pending at discharge. Gram stain revealed abundant gram negative rods and moderate gram positive cocci in pairs/clusters. No further wheezing since steroids have been tapered and weaned. No indication at this time to continue steroid taper using prednisone. On room air patient had an ambulatory sats down to 83% so home oxygen has been ordered. No wheezing with ambulation. We'll continue guaifenesin and cough suppression therapy as prior to admission.  Sats  w/ ambulation will need to be reassessed once the pt has recovered to his baseline, as it is anticipated that he will NOT need long term O2 support.    Psoriasis on methotrexate Patient had previously held last dosage of methotrexate  because he felt himself getting sick and will not resume this medication until current symptoms have resolved.  Nicotine abuse Patient counseled on sequelae of continuing to smoke including death by Dr. Joseph Art, as well as Dr. Sharon Seller  Procedures: 2-D echocardiogram:   - Left ventricle: The cavity size was normal. Wall thickness was normal. Systolic function was normal. The estimated ejection fraction was in the range of 55% to 60%. GLPSS is mildly reduced at -18%, there is focal inferoseptal strain abnormality. Left ventricular diastolic function parameters were normal. - Aortic valve: Sclerosis without stenosis. There was no significant regurgitation. - Mitral valve: Calcified annulus. There was trivial regurgitation. - Left atrium: The atrium was normal in size. - Right atrium: The atrium was mildly dilated.  Subsequent LHC 05/06/14  occ RCA,  LVEDP 15 with nl EF    05/21/2014 1st [facility] visit/ Joseph Shields   Chief Complaint  Patient presents with   Pulmonary Consult    Referred by Dr. Jamelle Haring. Pt c/o SOB for the past 2 months "with anything strenuous".  He also c/o prod cough with white sputum. He states that he was dxed with PNA and bronchitis 04/13/14. He is using albuterol about 1 x per day on average.   breathing better since admit and wants to know does he have copd and does he need 02, maintaining off cigs Not limited by breathing from desired activities but not aerobic    06/19/2014 f/u ov/Joseph Shields re: GOLD III copd  Chief Complaint  Patient presents with   Follow-up    Pt states that his breathing has improved some, but not back to his normal baseline. He states that he has used rescue inhaler only 3 x in the past month.   goal is to be able to walk fast - does fine slow pace, does fine sleeping/ sitting. Not using saba much at all at this point/ no noct or early am symptoms or coughing  Rec Start symbicort 160 Take 2 puffs first thing in am and then another 2  puffs about 12 hours later.  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 *   Procedures: Echocardiogram CTA chest with contrast  Consults:  Cardiology, Physical Therapy Evaluation and Treatment, and Occupational Therapy Evaluation and Treatment  Home Health/DME: DME: Home O2, nebulizer and Case Management has organized home health.   Hospital Course:  Joseph Shields is a 78 y.o. 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  Briefly patient is a very pleasant 78 year old male who presented with signs and symptoms concerning for acute heart failure exacerbation with preserved EF. Patient has a known history of CAD, follows with cardiologist Dr. Mayford Knife and has scheduled follow-up appointment on 05/17/2022 at 1:30 PM. Cardiology services have evaluated patient during his hospital  stay, he is euvolemic and may slightly volume down based on mild increase in creatinine. Small IV fluid bolus given to patient on the morning of discharge. Discussed discharge plan with patient including home O2 which she did qualify for based on 6-minute walk test, home nebulizer machine with treatments, and follow-up to outpatient providers.  Issues addressed during current hospitalization:  1. HFpEF, acute exacerbation Patient established with Dr. Mayford Knife, has follow-up appointment on 05/17/2022 Euvolemic on today's exam, Lasix discontinued Creatinine slightly increased  at 1.5, patient voiding spontaneously, small IV bolus given with 250 mL prior to discharge home. Will hold off on as needed prescription for Lasix at discharge, patient has close follow-up with cardiology on 05/17/2022 and echocardiogram largely unremarkable this hospitalization. CTA chest on arrival did not show any sizable pleural effusions or pulmonary edema  2. COPD with acute exacerbation/bronchitis/emphysema Suspect that this is because of patient's persistent shortness of breath He was oxygen tested today and did qualify for home O2 both at rest and during exertion. Oxygen saturation on room air with patient at rest = 88% Oxygen saturation on room air with exertion / ambulation = 86% Oxygen saturation with exertion / ambulating on oxygen = 92% on 3 lpm Patient to have oxygen delivered to the hospital prior to discharge home Will have follow-up with cardiologist, and also has a scheduled appointment with pulmonology as well. New prescription for nebulizer machine and treatments provided No indication for antibiotic at this time.  3. Hypertension/CAD/remote MI BP currently within acceptable parameters Continue home medications Follow-up with outpatient cardiologist  4. Mixed hyperlipidemia Continue statin    05/18/2022  Re-establish  ov/Joseph Shields re: COPD GOLD 3    maint on prn saba neb  Chief Complaint  Patient presents with   Consult    COPD and SOB with low oxygen sats.  Admit 05/03/22 to 05/05/22.  Stopped Spiriva and Trelegy.  Did not see any improvement with sx.  Dyspnea:  baseline = walking at [employer] same pace as others / [place] 30 min  on setting 6 sob across the room post admit  Cough: rattling cough worse in am clear mucus  Sleeping: on side/ one pillow  SABA NGE:[id number]  02: 3lpm 24/7   Rec Make sure you check your oxygen saturation  AT  your highest level of activity (not after you stop)   to be sure it stays over 90%   Plan A = Automatic = Always=   Breztri Take 2 puffs  first thing in am and then another 2 puffs about 12 hours later.   Work on inhaler technique: Plan B = Backup (to supplement plan A, not to replace it) Only use your albuterol inhaler as a rescue medication Plan C = Crisis (instead of Plan B but only if Plan B stops working) - only use your albuterol nebulizer if you first try Plan B  Make sure you check your oxygen saturation  AT  your highest level of activity (not after you stop)   to be sure it stays over 90%    06/29/2022  f/u ov/Joseph Shields re: copd GOLD 3  maint on Breztri 2bid   Chief Complaint  Patient presents with   Follow-up    Breathing has improved. He has still not had to use his rescue inhaler. No new co's.    get up the hill to feed the dog easier  Dyspnea:  improved  Cough: better  Sleeping: flat bed/ on side one pillow SABA use: none  02:  3lpm hs and sitting still on 3lpm  Covid status:   vax x 3  Lung cancer screening :  05/04/23   Rec My Shields will be contacting you by phone for referral to [employer] for best fit for portable 02   Make sure you check your oxygen saturation  AT  your highest level of activity (not after you stop)   to be sure it stays over 90% Please schedule a follow up visit in 3 months but call sooner if needed    12/06/2022  f/u ov/Joseph Shields re: GOLD 3 COPD/ 02 dep   maint on Breztri  Dyspnea:  not really that active yet and not checking sats with activlty  as rec  Cough: none  Sleeping: bed is flat/ one pillow no resp cc SABA use: none  02: 3lpm  Rec Make sure you check your oxygen saturation  AT  your highest level of activity (not after you stop)   to be sure it stays over 90%   Continue 3lpm 02  at bedtime Ok to try breztri 2 puffs 1st thing am and the evening puffs are optional if you are doing great  Please schedule a follow up visit in 6  months but call sooner if needed  and bring portable concentrator and your inhalers     07/21/2023  f/u ov/Joseph Shields/Mason Dibiasio re: GOLD 3 COPD/02 dep  maint  on breztri  did  bring 02/inhalers  Chief Complaint  Patient presents with   Follow-up    3 month follow up   Dyspnea:  working out x 30 min / day s 02  and without checking sats  Cough: none  Sleeping: flat bed / one pillow s    resp cc  SABA use: avg < 1/ week  02: 3lpm hs and none sitting still , none when does work out   Lung cancer screening: referred today   No obvious day to day or daytime variability or assoc excess/ purulent sputum or mucus plugs or hemoptysis or cp or chest tightness, subjective wheeze or overt sinus or hb symptoms.    Also denies any obvious fluctuation of symptoms with weather or environmental changes or other aggravating or alleviating factors except as outlined above   No unusual exposure hx or h/o childhood pna/ asthma or knowledge of premature birth.  Current Allergies, Complete Past Medical History, Past Surgical History, Family History, and Social History were reviewed in [employer] record.  ROS  The following are not active complaints unless bolded Hoarseness, sore throat, dysphagia, dental problems, itching, sneezing,  nasal congestion or discharge of excess mucus or purulent secretions, ear ache,   fever, chills, sweats, unintended wt loss or wt gain, classically pleuritic or exertional cp,  orthopnea pnd or arm/hand swelling  or leg swelling, presyncope, palpitations, abdominal pain, anorexia, nausea, vomiting, diarrhea  or change in bowel habits or change in bladder habits, change in stools or change in urine, dysuria, hematuria,  rash, arthralgias, visual complaints, headache, numbness, weakness or ataxia or problems with walking or coordination,  change in mood or  memory.        Current Meds  Medication Sig   acetaminophen (TYLENOL) 500 MG tablet Take 500 mg by mouth every 6 (six) hours as needed for moderate pain (pain score 4-6).   albuterol (VENTOLIN HFA) 108 (90 Base) MCG/ACT inhaler INHALE 2 PUFFS BY MOUTH EVERY 6  HOURS AS NEEDED FOR WHEEZING OR SHORTNESS OF BREATH   aspirin EC 81  MG tablet Take 81 mg by mouth daily.   Budeson-Glycopyrrol-Formoterol (BREZTRI AEROSPHERE) 160-9-4.8 MCG/ACT AERO Inhale 2 puffs into the lungs 2 (two) times daily.   cetirizine (ZYRTEC) 10 MG tablet Take 10 mg by mouth daily.   clopidogrel (PLAVIX) 75 MG tablet Take 1 tablet (75 mg total) by mouth daily.   folic acid (FOLVITE) 1 MG tablet TAKE 1 TABLET EVERY DAY   metoprolol succinate (TOPROL-XL) 25 MG 24 hr tablet Take 1 tablet (25 mg total) by mouth daily.   rosuvastatin (CRESTOR) 20 MG tablet TAKE 1 TABLET EVERY DAY   triamcinolone ointment (KENALOG) 0.1 % Apply 1 Application topically 2 (two) times daily as needed (psoriasis).             Objective:   Physical Exam   Wts  07/21/2023        181   12/06/2022        183  06/29/2022          190  05/18/2022        190 .06/19/2014       201     05/21/14 194 lb 6.4 oz (88.179 kg)  05/10/14 192 lb 12.8 oz (87.454 kg)  05/09/14 192 lb (87.091 kg)     Vital signs reviewed  07/21/2023  - Note at rest 02 sats  91% on RA   General appearance:    amb wm nad   HEENT : Oropharynx  clear   Nasal turbinates nl    NECK :  without  apparent JVD/ palpable Nodes/TM    LUNGS: no acc muscle use,  Mild barrel  contour chest wall with bilateral  Distant bs s audible wheeze and  without cough on insp or exp maneuvers  and mild  Hyperresonant  to  percussion bilaterally     CV:  RRR  no s3 or murmur or increase in P2, and no edema   ABD: obese  soft and nontender    MS:  Nl gait/ ext warm without deformities Or obvious joint restrictions  calf tenderness, cyanosis or clubbing     SKIN: warm and dry without lesions    NEURO:  alert, approp, nl sensorium with  no motor or cerebellar deficits apparent.          Assessment & Plan:

## 2023-07-21 ENCOUNTER — Ambulatory Visit: Payer: Medicare Other | Admitting: Internal Medicine

## 2023-07-21 ENCOUNTER — Encounter: Payer: Self-pay | Admitting: Internal Medicine

## 2023-07-21 VITALS — BP 124/68 | HR 55 | Ht 68.0 in | Wt 181.2 lb

## 2023-07-21 DIAGNOSIS — Z87891 Personal history of nicotine dependence: Secondary | ICD-10-CM | POA: Diagnosis not present

## 2023-07-21 DIAGNOSIS — J9611 Chronic respiratory failure with hypoxia: Secondary | ICD-10-CM | POA: Diagnosis not present

## 2023-07-21 DIAGNOSIS — J449 Chronic obstructive pulmonary disease, unspecified: Secondary | ICD-10-CM | POA: Diagnosis not present

## 2023-07-21 NOTE — Patient Instructions (Addendum)
 Make sure you check your oxygen saturation  AT  your highest level of activity (not after you stop)   to be sure it stays over 90% and adjust  02 flow upward to maintain this level if needed but remember to turn it back to previous settings when you stop (to conserve your supply).    My office will be contacting you by phone for referral to lung cancer screening   [phone] - if you don't hear back from my office within one week,  please call us back or notify us thru MyChart and we'll address it right away.     Please schedule a follow up visit in  12  months but call sooner if needed

## 2023-07-21 NOTE — Assessment & Plan Note (Signed)
Low-dose CT lung cancer screening is recommended for patients who are 65-77 years of age with a 20+ pack-year history of smoking and who are currently smoking or quit <=15 years ago. No coughing up blood  No unintentional weight loss of > 15 pounds in the last 6 months - pt is eligible for scanning yearly until age 84 > referred

## 2023-07-21 NOTE — Assessment & Plan Note (Signed)
 02 dep since admit 04/2021 - 05/18/2022   Walked on 3lpm cont  x  2  lap(s) =  approx 500 ft  @ mod  pace, stopped due to sob  with lowest 02 sats 93%   - 06/29/2022   Walked on RA  x  2  lap(s) =  approx 500  ft  @ mod pace, stopped due to desats to 87% then placed on 4lpm POC  with lowest 02 sats 94% and finished another 250 ft   - 07/21/2023   Walked on RA  x  3  lap(s) =  approx 450  ft  @ brisk pace, stopped due to end of study  with lowest 02 sats 90%   With sats this good on RA should be ok to decrease noct 02 to 2lpm (very noisy at 3lpm ) and no need for 02 with adls but when works out:  Make sure you check your oxygen saturation  AT  your highest level of activity (not after you stop)   to be sure it stays over 90% and adjust  02 flow upward to maintain this level if needed but remember to turn it back to previous settings when you stop (to conserve your supply).   F/u can by yearly, sooner prn  Each maintenance medication was reviewed in detail including emphasizing most importantly the difference between maintenance and prns and under what circumstances the prns are to be triggered using an action plan format where appropriate.  Total time for H and P, chart review, counseling, reviewing hfa/02/pulse ox device(s) , directly observing portions of ambulatory 02 saturation study/ and generating customized AVS unique to this office visit / same day charting = 32 m yearly followup  eval

## 2023-07-21 NOTE — Assessment & Plan Note (Signed)
 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  - 02 dep s/p d/c 05/14/22  - 12/06/2022   > continue breztri  - 07/21/2023  After extensive coaching inhaler device,  effectiveness =    90% with hfa > continue breztri    Group D (now reclassified as E) in terms of symptom/risk and laba/lama/ICS  therefore appropriate rx at this point >>>  breztri 2bid/ f/u yearly

## 2023-07-25 ENCOUNTER — Ambulatory Visit (INDEPENDENT_AMBULATORY_CARE_PROVIDER_SITE_OTHER): Payer: Medicare Other | Admitting: Physician Assistant

## 2023-07-25 ENCOUNTER — Ambulatory Visit (INDEPENDENT_AMBULATORY_CARE_PROVIDER_SITE_OTHER)
Admission: RE | Admit: 2023-07-25 | Discharge: 2023-07-25 | Disposition: A | Discharge status: Home / Self Care | Source: Ambulatory Visit | Attending: Surgery | Admitting: Surgery

## 2023-07-25 ENCOUNTER — Other Ambulatory Visit: Payer: Self-pay | Admitting: Surgery

## 2023-07-25 ENCOUNTER — Encounter: Payer: Self-pay | Admitting: Physician Assistant

## 2023-07-25 ENCOUNTER — Ambulatory Visit (HOSPITAL_COMMUNITY)
Admission: RE | Admit: 2023-07-25 | Discharge: 2023-07-25 | Disposition: A | Discharge status: Home / Self Care | Payer: Medicare Other | Source: Ambulatory Visit | Attending: Surgery | Admitting: Surgery

## 2023-07-25 VITALS — BP 155/75 | HR 55 | Temp 98.0°F | Ht 68.0 in | Wt 180.6 lb

## 2023-07-25 DIAGNOSIS — I739 Peripheral vascular disease, unspecified: Secondary | ICD-10-CM | POA: Diagnosis not present

## 2023-07-25 DIAGNOSIS — I724 Aneurysm of artery of lower extremity: Secondary | ICD-10-CM | POA: Diagnosis not present

## 2023-07-25 NOTE — Progress Notes (Signed)
 HISTORY AND PHYSICAL     CC:  follow up. Requesting Provider:  Mechele Claude, MD  HPI: This is a 78 y.o. male who is here today for follow up for PAD.  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 Myra Gianotti.    He did have a CTA July 2024 that revealed maximum diameter of infrarenal aorta of 3.0cm.   Pt was last seen 06/20/2023 as an urgent add on for bruising of the right groin and entire right leg and RLE edema to level of the knee.  He had a psa duplex that was negative but there was a sizable hematoma present.  The left foot was well perfused with a palpable DP pulse.  He called a few days later with complaints of leg swelling.  The compression was uncomfortable and he could not wear these.  He was educated on leg elevation and try to continue wearing compression.   The pt returns today for follow up and here with his wife.  He states that his right leg is much better but still with some tenderness on the lower leg around the medial ankle.  He still has some swelling but this has also improved.  He is wearing compression sock and it does help with the swelling.  He states that the hematoma in his groin has also gotten better.  He states he is not taking the plavix that he was only supposed to take it for a month.  He states that he is taking his asa about 3 days a week.    The pt is on a statin for cholesterol management.    The pt is on an aspirin.    Other AC:  Plavix The pt is not on medication for hypertension.  The pt is not on medication for diabetes. Tobacco hx:  former   Past Medical History:  Diagnosis Date   CAD (coronary artery disease)    a. 03/2014 NSTEMI - initially refused cath;  b. 03/2014 Echo: EF 55-60%;  c. 04/2014 Cath: LM nl, LAD 20-30p, 79m, D1 30-40, D2 small, 60-70, LCX nl, RCA 100p (CTO), L->R collaterals, EF 55-60%-->Med Rx.   COPD  (chronic obstructive pulmonary disease) (HCC)    History of tobacco abuse    a. 40+ pack years, quit 03/2014.   Hyperlipidemia    Morbid obesity (HCC)    Peripheral arterial disease (HCC)    Peripheral vascular disease (HCC)    Psoriasis     Past Surgical History:  Procedure Laterality Date   ABDOMINAL AORTOGRAM W/LOWER EXTREMITY N/A 06/07/2023   Procedure: ABDOMINAL AORTOGRAM W/LOWER EXTREMITY;  Surgeon: Nada Libman, MD;  Location: [facility];  Service: Cardiovascular;  Laterality: N/A;   CATARACT EXTRACTION, BILATERAL     ENDOVASCULAR REPAIR OF POPLITEAL ARTERY ANEURYSM  06/07/2023   Procedure: ENDOVASCULAR REPAIR OF POPLITEAL ARTERY ANEURYSM;  Surgeon: Nada Libman, MD;  Location: [facility];  Service: Cardiovascular;;  stent and angioplasty to left popliteal   LEFT HEART CATHETERIZATION WITH CORONARY ANGIOGRAM N/A 05/06/2014   Procedure: LEFT HEART CATHETERIZATION WITH CORONARY ANGIOGRAM;  Surgeon: Marykay Lex, MD;  Location: [facility] CATH LAB;  Service: Cardiovascular;  Laterality: N/A;   PERIPHERAL VASCULAR ULTRASOUND/IVUS  06/07/2023   Procedure: Peripheral Vascular Ultrasound/IVUS;  Surgeon: Nada Libman, MD;  Location: [facility];  Service: Cardiovascular;;   SPINE  SURGERY      Allergies  Allergen Reactions   Flonase [Fluticasone Propionate] Other (See Comments)    Nose bleeds.   Penicillins Other (See Comments)    unknown    Current Outpatient Medications  Medication Sig Dispense Refill   acetaminophen (TYLENOL) 500 MG tablet Take 500 mg by mouth every 6 (six) hours as needed for moderate pain (pain score 4-6).     albuterol (VENTOLIN HFA) 108 (90 Base) MCG/ACT inhaler INHALE 2 PUFFS BY MOUTH EVERY 6 HOURS AS NEEDED FOR WHEEZING OR SHORTNESS OF BREATH 18 g 0   aspirin EC 81 MG tablet Take 81 mg by mouth daily.     Budeson-Glycopyrrol-Formoterol (BREZTRI AEROSPHERE) 160-9-4.8 MCG/ACT AERO Inhale 2 puffs into the lungs 2 (two) times daily.  32.1 g 3   cetirizine (ZYRTEC) 10 MG tablet Take 10 mg by mouth daily.     clopidogrel (PLAVIX) 75 MG tablet Take 1 tablet (75 mg total) by mouth daily. 30 tablet 11   folic acid (FOLVITE) 1 MG tablet TAKE 1 TABLET EVERY DAY 90 tablet 0   metoprolol succinate (TOPROL-XL) 25 MG 24 hr tablet Take 1 tablet (25 mg total) by mouth daily. 90 tablet 3   rosuvastatin (CRESTOR) 20 MG tablet TAKE 1 TABLET EVERY DAY 90 tablet 0   triamcinolone ointment (KENALOG) 0.1 % Apply 1 Application topically 2 (two) times daily as needed (psoriasis).     No current facility-administered medications for this visit.    Family History  Problem Relation Age of Onset   Stroke Mother 7   Allergies Mother    Asthma Mother    Cancer Mother        colon   Heart disease Father 65       quadruple bypass   Healthy Daughter    Healthy Daughter     Social History   Socioeconomic History   Marital status: Married    Spouse name: Steward Drone   Number of children: 2   Years of education: associates degree   Highest education level: Associate degree: occupational, Scientist, product/process development, or vocational program  Occupational History   Occupation: Retired    Comment: Wellsite geologist  Tobacco Use   Smoking status: Former    Current packs/day: 0.00    Average packs/day: 0.5 packs/day for 49.0 years (24.5 ttl pk-yrs)    Types: Cigarettes    Start date: 04/07/1965    Quit date: 04/07/2014    Years since quitting: 9.3   Smokeless tobacco: Never  Vaping Use   Vaping status: Never Used  Substance and Sexual Activity   Alcohol use: No    Alcohol/week: 0.0 standard drinks of alcohol   Drug use: No   Sexual activity: Yes  Other Topics Concern   Not on file  Social History Narrative   Lives home with wife. Children live nearby   Social Drivers of Health   Financial Resource Strain: Low Risk  (06/16/2022)   Overall Financial Resource Strain (CARDIA)    Difficulty of Paying Living Expenses: Not hard at all  Food  Insecurity: No Food Insecurity (06/16/2022)   Hunger Vital Sign    Worried About Running Out of Food in the Last Year: Never true    Ran Out of Food in the Last Year: Never true  Transportation Needs: No Transportation Needs (06/16/2022)   PRAPARE - Administrator, Civil Service (Medical): No    Lack of Transportation (Non-Medical): No  Physical Activity: Inactive (06/16/2022)   Exercise  Vital Sign    Days of Exercise per Week: 0 days    Minutes of Exercise per Session: 0 min  Stress: No Stress Concern Present (06/16/2022)   [employer] of Occupational Health - Occupational Stress Questionnaire    Feeling of Stress : Not at all  Social Connections: Moderately Integrated (06/16/2022)   Social Connection and Isolation Panel [NHANES]    Frequency of Communication with Friends and Family: More than three times a week    Frequency of Social Gatherings with Friends and Family: More than three times a week    Attends Religious Services: More than 4 times per year    Active Member of [employer] or Organizations: No    Attends Banker Meetings: Never    Marital Status: Married  Catering manager Violence: Not At Risk (06/16/2022)   Humiliation, Afraid, Rape, and Kick questionnaire    Fear of Current or Ex-Partner: No    Emotionally Abused: No    Physically Abused: No    Sexually Abused: No     REVIEW OF SYSTEMS:   [X]  denotes positive finding, [ ]  denotes negative finding Cardiac  Comments:  Chest pain or chest pressure:    Shortness of breath upon exertion:    Short of breath when lying flat:    Irregular heart rhythm:        Vascular    Pain in calf, thigh, or hip brought on by ambulation:    Pain in feet at night that wakes you up from your sleep:     Blood clot in your veins:    Leg swelling:  x       Pulmonary    Oxygen at home:    Productive cough:     Wheezing:         Neurologic    Sudden weakness in arms or legs:     Sudden numbness in arms or  legs:     Sudden onset of difficulty speaking or slurred speech:    Temporary loss of vision in one eye:     Problems with dizziness:         Gastrointestinal    Blood in stool:     Vomited blood:         Genitourinary    Burning when urinating:     Blood in urine:        Psychiatric    Major depression:         Hematologic    Bleeding problems:    Problems with blood clotting too easily:        Skin    Rashes or ulcers:        Constitutional    Fever or chills:      PHYSICAL EXAMINATION:  Today's Vitals   07/25/23 1354  BP: (!) 155/75  Pulse: (!) 55  Temp: 98 F (36.7 C)  TempSrc: Temporal  SpO2: 92%  Weight: 180 lb 9.6 oz (81.9 kg)  Height: 5\' 8"  (1.727 m)   Body mass index is 27.46 kg/m.   General:  WDWN in NAD; vital signs documented above Gait: Not observed HENT: WNL, normocephalic Pulmonary: normal non-labored breathing , without wheezing Cardiac: regular HR, without carotid bruits Abdomen: soft, NT; aortic pulse is not palpable Skin: without rashes Vascular Exam/Pulses:  Right Left  Radial 2+ (normal) 2+ (normal)  Femoral 1+ (weak) 1+ (weak)  Popliteal Unable to palpate Unable to palpate  DP 2+ (normal) 2+ (normal)   Extremities: without ischemic  changes, without Gangrene , without cellulitis; without open wounds; mild RLE edema Musculoskeletal: no muscle wasting or atrophy  Neurologic: A&O X 3 Psychiatric:  The pt has Normal affect.   Non-Invasive Vascular Imaging:   ABI's/TBI's on 07/25/2023: Right:  0.99/0.70 - Great toe pressure: 94 Left:  1.09/0.76 - Great toe pressure: 102  Arterial duplex on 07/25/2023: Patent left popliteal stent without stenosis 50-74% stenosis of PFA    ASSESSMENT/PLAN:: 78 y.o. 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 Myra Gianotti.     -pt with  palpable DP pulses bilaterally -pt seen with Dr. Myra Gianotti and he reviewed CTA.  No stenosis of right popliteal artery.   -pt will f/u in 6 months with LLE arterial duplex -pt will f/u in one year with AAA duplex.  AAA measured 3.0 cm last July.  Pt does not have biological children in regards to AAA. -continue asa and crestor daily    Doreatha Massed, [facility] [phone]  Clinic MD:   pt seen with Dr. Myra Gianotti.

## 2023-07-26 LAB — VAS US ABI WITH/WO TBI
Left ABI: 1.09
Right ABI: 0.99

## 2023-08-04 ENCOUNTER — Telehealth: Payer: Self-pay

## 2023-08-04 DIAGNOSIS — Z122 Encounter for screening for malignant neoplasm of respiratory organs: Secondary | ICD-10-CM

## 2023-08-04 DIAGNOSIS — Z87891 Personal history of nicotine dependence: Secondary | ICD-10-CM

## 2023-08-04 NOTE — Telephone Encounter (Signed)
.  Lung Cancer Screening Narrative/Criteria Questionnaire (Cigarette Smokers Only- No Cigars/Pipes/vapes)   Joseph Field Bruyere Jr.   SDMV:5/2/205 at 9:00 am with Dorene Grebe        1945-09-02   LDCT: 09/02/2023 at [facility] at 1:30 pm    77 y.o.   Phone: [phone]  Lung Screening Narrative (confirm age 31-77 yrs Medicare / 50-80 yrs Private pay insurance)   Insurance information:[employer] Medicare   Referring Provider:Wert    This screening involves an initial phone call with a team member from our program. It is called a shared decision making visit. The initial meeting is required by  insurance and Medicare to make sure you understand the program. This appointment takes about 15-20 minutes to complete. You will complete the screening scan at your scheduled date/time.  This scan takes about 5-10 minutes to complete. You can eat and drink normally before and after the scan.  Criteria questions for Lung Cancer Screening:   Are you a current or former smoker? Former Age began smoking: 17   If you are a former smoker, what year did you quit smoking?Quit 2015 (within 15 yrs)   To calculate your smoking history, I need an accurate estimate of how many packs of cigarettes you smoked per day and for how many years. (Not just the number of PPD you are now smoking)   Years smoking 45 x Packs per day 1 = Pack years 46   (at least 20 pack yrs)   (Make sure they understand that we need to know how much they have smoked in the past, not just the number of PPD they are smoking now)  Do you have a personal history of cancer?  No    Do you have a family history of cancer? Yes  (cancer type and and relative) Mother Colon Cancer   Are you coughing up blood?  No  Have you had unexplained weight loss of 15 lbs or more in the last 6 months? No  It looks like you meet all criteria.  When would be a good time for [place] to schedule you for this screening?   Additional information: N/A

## 2023-08-05 DIAGNOSIS — J449 Chronic obstructive pulmonary disease, unspecified: Secondary | ICD-10-CM | POA: Diagnosis not present

## 2023-08-05 DIAGNOSIS — J441 Chronic obstructive pulmonary disease with (acute) exacerbation: Secondary | ICD-10-CM | POA: Diagnosis not present

## 2023-08-25 ENCOUNTER — Telehealth: Payer: Self-pay | Admitting: Acute Care

## 2023-08-25 NOTE — Telephone Encounter (Signed)
 Spoke with patient.  Confirmed he wants to cancel sdmv and LDCT.  Patient did not give a reason stating if he wishes to reschedule he will call us .  He is not interested at this time in these appointments.

## 2023-08-25 NOTE — Telephone Encounter (Signed)
 Patient would like his shared decisions appointment cancelled. I advised E2C2 agent that his CT might get cancelled as well if he doesn't have his shared decisions before the 9th.

## 2023-08-26 ENCOUNTER — Encounter

## 2023-09-02 ENCOUNTER — Ambulatory Visit (HOSPITAL_COMMUNITY)

## 2023-09-04 DIAGNOSIS — J449 Chronic obstructive pulmonary disease, unspecified: Secondary | ICD-10-CM | POA: Diagnosis not present

## 2023-09-04 DIAGNOSIS — J441 Chronic obstructive pulmonary disease with (acute) exacerbation: Secondary | ICD-10-CM | POA: Diagnosis not present

## 2023-09-22 ENCOUNTER — Ambulatory Visit: Payer: Self-pay

## 2023-09-22 NOTE — Telephone Encounter (Signed)
 Chief Complaint: refill methotrexate  Symptoms: psoriasis "outbreak" Frequency: constant Pertinent Negatives: Patient denies fever, all other symptoms Disposition: [] ED /[] Urgent Care (no appt availability in office) / [] Appointment(In office/virtual)/ []  [place] [facility]/ [] Home Care/ [] Refused Recommended Disposition /[] [place] [facility]/ [x]  Follow-up with PCP Additional Notes:   Patient returning call.  Requesting Methotrexate  refill. He hasn't taken this since 2024 and now psoriasis has recurred. "Psoriasis outbreak all over". States he needs this filled asap as it will probably take 2-3 weeks to be effective. He would like prescription sent asap. Declines evaluation. Please advise.    Reason for Disposition  [1] Prescription refill request for NON-ESSENTIAL medicine (i.e., no harm to patient if med not taken) AND [2] triager unable to refill per department policy  Dry skin is chronic problem (recurrent or ongoing AND present > 4 weeks)  Protocols used: Medication Refill and Renewal Call-A-AH, Cracked or Dry Skin-A-AH

## 2023-09-22 NOTE — Telephone Encounter (Signed)
 2nd call attempt. Left voicemail to call back.

## 2023-09-22 NOTE — Telephone Encounter (Signed)
 3rd attempt, Patient called, left VM to return the call to the office to speak to NT. Unable to reach patient after 3 attempts by E2C2 NT, routing to the provider for resolution per protocol.   Copied from CRM [id number]. Topic: Clinical - Medication Question >> Sep 22, 2023 12:32 PM Lizabeth Riggs wrote: Reason for CRM:  Joseph Shields wants to see if Dr. Veleta Gerold would call in order for methotrexate  (RHEUMATREX) 2.5 MG tablet. This medication was discontinued in 06/2022. He would like the medication to be sent to [facility] listed in his chart. Please call him if Dr. Veleta Gerold cannot send order. His number is [phone]

## 2023-09-22 NOTE — Telephone Encounter (Signed)
  Patient called, left VM to return the call to the office to speak to NT.    Copied from CRM [id number]. Topic: Clinical - Medication Question >> Sep 22, 2023 12:32 PM Lizabeth Riggs wrote: Reason for CRM:  Mr. Chiou wants to see if Dr. Veleta Gerold would call in order for methotrexate  (RHEUMATREX) 2.5 MG tablet. This medication was discontinued in 06/2022. He would like the medication to be sent to [facility] listed in his chart. Please call him if Dr. Veleta Gerold cannot send order. His number is [phone]. Thanks

## 2023-09-23 NOTE — Telephone Encounter (Signed)
 Pt would like to start back on methotrexate  for his psoriasis. Informed pt that he needs labs and an appt ot discuss this. He has not been on the medication for at least 9m.  Appt made for Monday June 2nd at 3:10pm with [facility].

## 2023-09-26 ENCOUNTER — Ambulatory Visit: Admitting: Family Medicine

## 2023-09-26 ENCOUNTER — Encounter: Payer: Self-pay | Admitting: Family Medicine

## 2023-09-26 VITALS — BP 113/56 | HR 72 | Temp 97.7°F | Ht 68.0 in | Wt 180.0 lb

## 2023-09-26 DIAGNOSIS — I25118 Atherosclerotic heart disease of native coronary artery with other forms of angina pectoris: Secondary | ICD-10-CM | POA: Diagnosis not present

## 2023-09-26 DIAGNOSIS — J449 Chronic obstructive pulmonary disease, unspecified: Secondary | ICD-10-CM

## 2023-09-26 DIAGNOSIS — N400 Enlarged prostate without lower urinary tract symptoms: Secondary | ICD-10-CM

## 2023-09-26 DIAGNOSIS — E782 Mixed hyperlipidemia: Secondary | ICD-10-CM

## 2023-09-26 DIAGNOSIS — E559 Vitamin D deficiency, unspecified: Secondary | ICD-10-CM

## 2023-09-26 DIAGNOSIS — I10 Essential (primary) hypertension: Secondary | ICD-10-CM | POA: Diagnosis not present

## 2023-09-26 DIAGNOSIS — L409 Psoriasis, unspecified: Secondary | ICD-10-CM

## 2023-09-26 MED ORDER — BREZTRI AEROSPHERE 160-9-4.8 MCG/ACT IN AERO: 2.0000 | INHALATION_SPRAY | Freq: Two times a day (BID) | RESPIRATORY_TRACT | 3 refills | Status: DC

## 2023-09-26 MED ORDER — FOLIC ACID 1 MG PO TABS: 1.0000 mg | ORAL_TABLET | Freq: Every day | ORAL | 3 refills | Status: AC

## 2023-09-26 MED ORDER — ROSUVASTATIN CALCIUM 20 MG PO TABS: 20.0000 mg | ORAL_TABLET | Freq: Every day | ORAL | 3 refills | Status: AC

## 2023-09-26 MED ORDER — METHOTREXATE SODIUM 2.5 MG PO TABS: 10.0000 mg | ORAL_TABLET | ORAL | 1 refills | Status: DC

## 2023-09-26 MED ORDER — METOPROLOL SUCCINATE ER 25 MG PO TB24: 25.0000 mg | ORAL_TABLET | Freq: Every day | ORAL | 3 refills | Status: AC

## 2023-09-26 NOTE — Progress Notes (Signed)
 Subjective:  Patient ID: Joseph Shields., male    DOB: 1945/05/09  Age: 78 y.o. MRN: [id number]  CC: Arthritis (Would like to start back on methotrexate . )   HPI Joseph Shields. presents for  follow-up of hypertension. Patient has no history of headache chest pain or shortness of breath or recent cough. Patient also denies symptoms of TIA such as focal numbness or weakness. Patient denies side effects from medication. States taking it regularly. Psoriasis started flaring, very unsiely to him and uncomfortable. Affecting all 4 extremities and the trunk. Used methotrexate  in the past and wants to restart. It worked well for him. Understands the pros and cons. Will use folate.    in for follow-up of elevated cholesterol. Doing well without complaints on current medication. Denies side effects of statin including myalgia and arthralgia and nausea. Currently no chest pain, shortness of breath or other cardiovascular related symptoms noted.    History Mare has a past medical history of CAD (coronary artery disease), COPD (chronic obstructive pulmonary disease) (HCC), History of tobacco abuse, Hyperlipidemia, Morbid obesity (HCC), Peripheral arterial disease (HCC), Peripheral vascular disease (HCC), and Psoriasis.   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   His family history includes Allergies in his mother; Asthma in his mother; Cancer in his mother; Healthy in his daughter and daughter; Heart disease (age of onset: 59) in his father; Stroke (age of onset: 50) in his mother.He reports that he quit smoking about 9 years ago. His smoking use included cigarettes. He started smoking about 58 years ago. He  has a 24.5 pack-year smoking history. He has never used smokeless tobacco. He reports that he does not drink alcohol and does not use drugs.  Current Outpatient Medications on File Prior to Visit  Medication Sig Dispense Refill   acetaminophen  (TYLENOL ) 500 MG tablet Take 500 mg by mouth every 6 (six) hours as needed for moderate pain (pain score 4-6).     albuterol  (VENTOLIN  HFA) 108 (90 Base) MCG/ACT inhaler INHALE 2 PUFFS BY MOUTH EVERY 6 HOURS AS NEEDED FOR WHEEZING OR SHORTNESS OF BREATH 18 g 0   aspirin  EC 81 MG tablet Take 81 mg by mouth daily.     cetirizine (ZYRTEC) 10 MG tablet Take 10 mg by mouth daily.     triamcinolone  ointment (KENALOG ) 0.1 % Apply 1 Application topically 2 (two) times daily as needed (psoriasis).     No current facility-administered medications on file prior to visit.    ROS Review of Systems  Constitutional:  Negative for fever.  Respiratory:  Negative for shortness of breath.   Cardiovascular:  Negative for chest pain.  Musculoskeletal:  Negative for arthralgias.  Skin:  Positive for rash.    Objective:  BP (!) 113/56   Pulse 72   Temp 97.7 F (36.5 C)   Ht 5\' 8"  (1.727 m)   Wt 180 lb (81.6 kg)   SpO2 97%   BMI 27.37 kg/m   BP Readings from Last 3 Encounters:  09/26/23 (!) 113/56  07/25/23 (!) 155/75  07/21/23 124/68    Wt Readings from Last 3 Encounters:  09/26/23 180 lb (81.6 kg)  07/25/23 180 lb 9.6 oz (81.9 kg)  07/21/23 181 lb 3.2 oz (82.2 kg)     Physical Exam Vitals reviewed.  Constitutional:      Appearance: He is well-developed.  HENT:     Head: Normocephalic and atraumatic.     Right Ear: External ear normal.     Left Ear: External ear normal.     Mouth/Throat:     Pharynx: No oropharyngeal exudate or posterior oropharyngeal erythema.  Eyes:     Pupils: Pupils are equal, round, and reactive to light.  Cardiovascular:     Rate and Rhythm: Normal rate and regular rhythm.     Heart sounds: No murmur  heard. Pulmonary:     Effort: No respiratory distress.     Breath sounds: Normal breath sounds.  Musculoskeletal:     Cervical back: Normal range of motion and neck supple.  Neurological:     Mental Status: He is alert and oriented to person, place, and time.       Assessment & Plan:  COPD GOLD 3 -     CBC with Differential/Platelet -     CMP14+EGFR  Essential hypertension -     Metoprolol  Succinate ER; Take 1 tablet (25 mg total) by mouth daily.  Dispense: 90 tablet; Refill: 3 -     CBC with Differential/Platelet -     CMP14+EGFR  Coronary artery disease of native artery of native heart with stable angina pectoris (HCC) -     Metoprolol  Succinate ER; Take 1 tablet (25 mg total) by mouth daily.  Dispense: 90 tablet; Refill: 3 -     CBC with Differential/Platelet -     CMP14+EGFR  Psoriasis -     CBC with Differential/Platelet -     CMP14+EGFR  Mixed hyperlipidemia -     Rosuvastatin  Calcium ; Take 1 tablet (20 mg total) by mouth daily.  Dispense: 90 tablet; Refill: 3 -     Lipid panel  End stage COPD (HCC) -     Breztri  Aerosphere; Inhale 2 puffs into the lungs 2 (two) times daily.  Dispense: 3 each; Refill: 3  Vitamin D deficiency -     VITAMIN D 25 Hydroxy (Vit-D Deficiency, Fractures)  Benign prostatic hyperplasia without lower urinary tract symptoms -     PSA, total and free  Other orders -     Methotrexate  Sodium; Take 4 tablets (10 mg total) by mouth once a week. Caution:Chemotherapy. Protect from light.  Dispense: 16 tablet; Refill: 1 -     Folic Acid ; Take 1 tablet (1 mg total) by mouth daily.  Dispense: 90 tablet; Refill: 3    Allergies as of 09/26/2023       Reactions   Flonase  [fluticasone  Propionate] Other (See Comments)   Nose bleeds.   Penicillins Other (See Comments)   unknown        Medication List        Accurate as of September 26, 2023  3:55 PM. If you have any questions, ask your nurse or doctor.          STOP taking these medications     clopidogrel  75 MG tablet Commonly known as: Plavix  Stopped by: Kadar Chance       TAKE these medications    acetaminophen  500 MG tablet Commonly known as: TYLENOL  Take 500 mg by mouth every 6 (six) hours as needed for moderate pain (pain score 4-6).   albuterol  108 (90 Base) MCG/ACT inhaler Commonly known as: VENTOLIN  HFA INHALE 2 PUFFS BY MOUTH EVERY 6 HOURS AS NEEDED FOR WHEEZING OR SHORTNESS OF BREATH   aspirin  EC 81 MG tablet Take  81 mg by mouth daily.   Breztri  Aerosphere 160-9-4.8 MCG/ACT Aero inhaler Generic drug: budesonide -glycopyrrolate-formoterol  Inhale 2 puffs into the lungs 2 (two) times daily.   cetirizine 10 MG tablet Commonly known as: ZYRTEC Take 10 mg by mouth daily.   folic acid  1 MG tablet Commonly known as: FOLVITE  Take 1 tablet (1 mg total) by mouth daily.   methotrexate  2.5 MG tablet Commonly known as: RHEUMATREX Take 4 tablets (10 mg total) by mouth once a week. Caution:Chemotherapy. Protect from light. Started by: Vallorie Gayer Trinisha Paget   metoprolol  succinate 25 MG 24 hr tablet Commonly known as: TOPROL -XL Take 1 tablet (25 mg total) by mouth daily.   rosuvastatin  20 MG tablet Commonly known as: CRESTOR  Take 1 tablet (20 mg total) by mouth daily.   triamcinolone  ointment 0.1 % Commonly known as: KENALOG  Apply 1 Application topically 2 (two) times daily as needed (psoriasis).         Follow-up: Return in about 1 month (around 10/26/2023) for methotrexate  therapy for psoriasis.  Roise Cleaver, M.D.

## 2023-09-27 LAB — CBC WITH DIFFERENTIAL/PLATELET
Basophils Absolute: 0 10*3/uL (ref 0.0–0.2)
Basos: 0 %
EOS (ABSOLUTE): 0.3 10*3/uL (ref 0.0–0.4)
Eos: 5 %
Hematocrit: 39.7 % (ref 37.5–51.0)
Hemoglobin: 12.9 g/dL — ABNORMAL LOW (ref 13.0–17.7)
Immature Grans (Abs): 0 10*3/uL (ref 0.0–0.1)
Immature Granulocytes: 0 %
Lymphocytes Absolute: 1.9 10*3/uL (ref 0.7–3.1)
Lymphs: 27 %
MCH: 31.5 pg (ref 26.6–33.0)
MCHC: 32.5 g/dL (ref 31.5–35.7)
MCV: 97 fL (ref 79–97)
Monocytes Absolute: 0.9 10*3/uL (ref 0.1–0.9)
Monocytes: 13 %
Neutrophils Absolute: 3.8 10*3/uL (ref 1.4–7.0)
Neutrophils: 55 %
Platelets: 152 10*3/uL (ref 150–450)
RBC: 4.09 x10E6/uL — ABNORMAL LOW (ref 4.14–5.80)
RDW: 13 % (ref 11.6–15.4)
WBC: 7 10*3/uL (ref 3.4–10.8)

## 2023-09-27 LAB — CMP14+EGFR
ALT: 19 IU/L (ref 0–44)
AST: 28 IU/L (ref 0–40)
Albumin: 4.3 g/dL (ref 3.8–4.8)
Alkaline Phosphatase: 63 IU/L (ref 44–121)
BUN/Creatinine Ratio: 21 (ref 10–24)
BUN: 21 mg/dL (ref 8–27)
Bilirubin Total: 0.4 mg/dL (ref 0.0–1.2)
CO2: 22 mmol/L (ref 20–29)
Calcium: 9.5 mg/dL (ref 8.6–10.2)
Chloride: 103 mmol/L (ref 96–106)
Creatinine, Ser: 0.98 mg/dL (ref 0.76–1.27)
Globulin, Total: 2.5 g/dL (ref 1.5–4.5)
Glucose: 92 mg/dL (ref 70–99)
Potassium: 4.6 mmol/L (ref 3.5–5.2)
Sodium: 138 mmol/L (ref 134–144)
Total Protein: 6.8 g/dL (ref 6.0–8.5)
eGFR: 80 mL/min/{1.73_m2} (ref >59)

## 2023-09-27 LAB — PSA, TOTAL AND FREE
PSA, Free Pct: 52 %
PSA, Free: 0.26 ng/mL
Prostate Specific Ag, Serum: 0.5 ng/mL (ref 0.0–4.0)

## 2023-09-27 LAB — VITAMIN D 25 HYDROXY (VIT D DEFICIENCY, FRACTURES): Vit D, 25-Hydroxy: 42.2 ng/mL (ref 30.0–100.0)

## 2023-09-27 LAB — LIPID PANEL
Chol/HDL Ratio: 2.3 ratio (ref 0.0–5.0)
Cholesterol, Total: 131 mg/dL (ref 100–199)
HDL: 58 mg/dL (ref >39)
LDL Chol Calc (NIH): 59 mg/dL (ref 0–99)
Triglycerides: 65 mg/dL (ref 0–149)
VLDL Cholesterol Cal: 14 mg/dL (ref 5–40)

## 2023-09-28 ENCOUNTER — Ambulatory Visit: Payer: Self-pay | Admitting: Family Medicine

## 2023-09-28 NOTE — Progress Notes (Signed)
 Hello Fre[place],  Your lab result is normal and/or stable.Some minor variations that are not significant are commonly marked abnormal, but do not represent any medical problem for you.  Best regards, Mechele Claude, M.D.

## 2023-10-17 ENCOUNTER — Ambulatory Visit: Admitting: Family Medicine

## 2023-10-26 ENCOUNTER — Ambulatory Visit: Admitting: Family Medicine

## 2023-11-02 ENCOUNTER — Ambulatory Visit (INDEPENDENT_AMBULATORY_CARE_PROVIDER_SITE_OTHER): Admitting: Family Medicine

## 2023-11-02 ENCOUNTER — Encounter: Payer: Self-pay | Admitting: Family Medicine

## 2023-11-02 VITALS — BP 110/69 | HR 56 | Temp 98.2°F | Ht 68.0 in | Wt 181.0 lb

## 2023-11-02 DIAGNOSIS — Z79631 Long term (current) use of antimetabolite agent: Secondary | ICD-10-CM

## 2023-11-02 DIAGNOSIS — L409 Psoriasis, unspecified: Secondary | ICD-10-CM

## 2023-11-02 DIAGNOSIS — J449 Chronic obstructive pulmonary disease, unspecified: Secondary | ICD-10-CM

## 2023-11-02 DIAGNOSIS — I10 Essential (primary) hypertension: Secondary | ICD-10-CM

## 2023-11-02 MED ORDER — METHOTREXATE SODIUM 2.5 MG PO TABS: 10.0000 mg | ORAL_TABLET | ORAL | 5 refills | Status: DC

## 2023-11-02 NOTE — Progress Notes (Signed)
 Subjective:  Patient ID: Joseph LITTIE [name], male    DOB: 09-05-1945  Age: 78 y.o. MRN: [id number]  CC: Medical Management of Chronic Issues (No concerns at this time. )   HPI Joseph LITTIE [name] presents for COPD follow up. Gets dyspneic with bending over. Breztri  working well. Albuterol  seldom needed but helps. Dr. Darlean follows his COPD & recommended cutting back the Breztri  to once a day.    presents for  follow-up of hypertension. Patient has no history of headache chest pain or shortness of breath or recent cough. Patient also denies symptoms of TIA such as focal numbness or weakness. Patient denies side effects from medication. States taking it regularly  .Pt. responded well to methotrexate . Psoriasis plaques clearing nicely        04/18/2023    1:03 PM 10/13/2022    2:34 PM 06/16/2022   12:08 PM  Depression screen PHQ 2/9  Decreased Interest 0 0 0  Down, Depressed, Hopeless 0 0 0  PHQ - 2 Score 0 0 0    History Joseph Shields has a past medical history of CAD (coronary artery disease), COPD (chronic obstructive pulmonary disease) (HCC), History of tobacco abuse, Hyperlipidemia, Morbid obesity (HCC), Peripheral arterial disease (HCC), Peripheral vascular disease (HCC), and Psoriasis.   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   His family history includes Allergies in his mother; Asthma in his mother; Cancer in his mother; Healthy in his daughter and daughter; Heart disease (age of onset: 37) in his father; Stroke (age of onset: 65) in his mother.He reports that he quit smoking about 9 years ago. His smoking use included cigarettes. He started smoking about 58 years ago. He has a 24.5 pack-year  smoking history. He has never used smokeless tobacco. He reports that he does not drink alcohol and does not use drugs.    ROS Review of Systems  Constitutional:  Negative for fever.  Respiratory:  Negative for shortness of breath.   Cardiovascular:  Negative for chest pain.  Musculoskeletal:  Negative for arthralgias.  Skin:  Negative for rash.  Neurological:  Negative for headaches.    Objective:  BP 110/69   Pulse (!) 56   Temp 98.2 F (36.8 C)   Ht 5' 8 (1.727 m)   Wt 181 lb (82.1 kg)   SpO2 94%   BMI 27.52 kg/m   BP Readings from Last 3 Encounters:  11/02/23 110/69  09/26/23 (!) 113/56  07/25/23 (!) 155/75    Wt Readings from Last 3 Encounters:  11/02/23 181 lb (82.1 kg)  09/26/23 180 lb (81.6 kg)  07/25/23 180 lb 9.6 oz (81.9 kg)     Physical Exam Vitals reviewed.  Constitutional:      Appearance: He is well-developed.  HENT:     Head: Normocephalic and atraumatic.     Right Ear: External ear normal.     Left Ear: External ear normal.     Mouth/Throat:     Pharynx: No oropharyngeal exudate or posterior oropharyngeal erythema.  Eyes:     Pupils: Pupils are equal, round, and reactive to light.  Cardiovascular:     Rate and Rhythm: Normal rate and regular rhythm.     Heart sounds: No murmur heard. Pulmonary:     Effort: No respiratory distress.  Breath sounds: Normal breath sounds.  Musculoskeletal:     Cervical back: Normal range of motion and neck supple.  Neurological:     Mental Status: He is alert and oriented to person, place, and time.      Assessment & Plan:  COPD GOLD 3  Psoriasis -     Methotrexate  Sodium; Take 4 tablets (10 mg total) by mouth once a week. Caution:Chemotherapy. Protect from light.  Dispense: 16 tablet; Refill: 5 -     CMP14+EGFR  On methotrexate  therapy -     CMP14+EGFR  Essential hypertension     Follow-up: Return in about 3 months (around 02/02/2024) for psoriasis.  Butler Der, M.D.

## 2023-11-03 ENCOUNTER — Ambulatory Visit: Payer: Self-pay | Admitting: Family Medicine

## 2023-11-03 LAB — CMP14+EGFR
ALT: 23 IU/L (ref 0–44)
AST: 30 IU/L (ref 0–40)
Albumin: 4.1 g/dL (ref 3.8–4.8)
Alkaline Phosphatase: 59 IU/L (ref 44–121)
BUN/Creatinine Ratio: 16 (ref 10–24)
BUN: 17 mg/dL (ref 8–27)
Bilirubin Total: 0.4 mg/dL (ref 0.0–1.2)
CO2: 22 mmol/L (ref 20–29)
Calcium: 9.2 mg/dL (ref 8.6–10.2)
Chloride: 106 mmol/L (ref 96–106)
Creatinine, Ser: 1.05 mg/dL (ref 0.76–1.27)
Globulin, Total: 2.5 g/dL (ref 1.5–4.5)
Glucose: 95 mg/dL (ref 70–99)
Potassium: 4.7 mmol/L (ref 3.5–5.2)
Sodium: 143 mmol/L (ref 134–144)
Total Protein: 6.6 g/dL (ref 6.0–8.5)
eGFR: 74 mL/min/1.73 (ref >59)

## 2023-11-03 NOTE — Progress Notes (Signed)
 Hello Fre[place],  Your lab result is normal and/or stable.Some minor variations that are not significant are commonly marked abnormal, but do not represent any medical problem for you.  Best regards, Mechele Claude, M.D.

## 2023-11-22 ENCOUNTER — Ambulatory Visit

## 2023-11-30 ENCOUNTER — Ambulatory Visit (INDEPENDENT_AMBULATORY_CARE_PROVIDER_SITE_OTHER)

## 2023-11-30 VITALS — BP 110/69 | HR 56 | Ht 68.0 in | Wt 181.0 lb

## 2023-11-30 DIAGNOSIS — Z0000 Encounter for general adult medical examination without abnormal findings: Secondary | ICD-10-CM

## 2023-11-30 NOTE — Patient Instructions (Signed)
 Mr. Joseph Shields , Thank you for taking time out of your busy schedule to complete your Annual Wellness Visit with me. I enjoyed our conversation and look forward to speaking with you again next year. I, as well as your care team,  appreciate your ongoing commitment to your health goals. Please review the following plan we discussed and let me know if I can assist you in the future. Your Game plan/ To Do List    Referrals: If you haven't heard from the office you've been referred to, please reach out to them at the phone provided.   Follow up Visits: We will see or speak with you next year for your Next Medicare AWV with our clinical staff on 11/29/24 at 10:20a.m. Have you seen your provider in the last 6 months (3 months if uncontrolled diabetes)? Yes  Clinician Recommendations:  Aim for 30 minutes of exercise or brisk walking, 6-8 glasses of water, and 5 servings of fruits and vegetables each day.       This is a list of the screenings recommended for you:  Health Maintenance  Topic Date Due   Screening for Lung Cancer  04/14/2015   Medicare Annual Wellness Visit  06/17/2023   Flu Shot  11/25/2023   Zoster (Shingles) Vaccine (1 of 2) 12/27/2023*   DTaP/Tdap/Td vaccine (1 - Tdap) 04/17/2024*   COVID-19 Vaccine (4 - 2024-25 season) 12/15/2024*   Pneumococcal Vaccine for age over 25  Completed   Hepatitis C Screening  Completed   Hepatitis B Vaccine  Aged Out   HPV Vaccine  Aged Out   Meningitis B Vaccine  Aged Out  *Topic was postponed. The date shown is not the original due date.    Advanced directives: (Declined) Advance directive discussed with you today. Even though you declined this today, please call our office should you change your mind, and we can give you the proper paperwork for you to fill out. Advance Care Planning is important because it:  [x]  Makes sure you receive the medical care that is consistent with your values, goals, and preferences  [x]  It provides guidance to your  family and loved ones and reduces their decisional burden about whether or not they are making the right decisions based on your wishes.  Follow the link provided in your after visit summary or read over the paperwork we have mailed to you to help you started getting your Advance Directives in place. If you need assistance in completing these, please reach out to us  so that we can help you!  See attachments for Preventive Care and Fall Prevention Tips.

## 2023-11-30 NOTE — Progress Notes (Signed)
 Subjective:   Joseph CROME Azevedo Shields. is a 78 y.o. who presents for a Medicare Wellness preventive visit.  As a reminder, Annual Wellness Visits don't include a physical exam, and some assessments may be limited, especially if this visit is performed virtually. We may recommend an in-person follow-up visit with your provider if needed.  Visit Complete: Virtual I connected with  Joseph Shields. on 11/30/23 by a audio enabled telemedicine application and verified that I am speaking with the correct person using two identifiers.  Patient Location: Home  Provider Location: Home Office  I discussed the limitations of evaluation and management by telemedicine. The patient expressed understanding and agreed to proceed.  Vital Signs: Because this visit was a virtual/telehealth visit, some criteria may be missing or patient reported. Any vitals not documented were not able to be obtained and vitals that have been documented are patient reported.  VideoDeclined- This patient declined Librarian, academic. Therefore the visit was completed with audio only.  Persons Participating in Visit: Patient.  AWV Questionnaire: No: Patient Medicare AWV questionnaire was not completed prior to this visit.  Cardiac Risk Factors include: advanced age (>63men, >21 women);dyslipidemia;sedentary lifestyle;male gender     Objective:    Today's Vitals   11/30/23 1359  BP: 110/69  Pulse: (!) 56  Weight: 181 lb (82.1 kg)  Height: 5' 8 (1.727 m)   Body mass index is 27.52 kg/m.     11/30/2023    2:05 PM 06/07/2023    6:21 AM 06/16/2022   12:09 PM 09/18/2020    3:44 PM 08/24/2019   11:02 AM 07/25/2017   10:48 AM 05/06/2014    9:03 AM  Advanced Directives  Does Patient Have a Medical Advance Directive? No No No No No No  No   Would patient like information on creating a medical advance directive?  No - Patient declined No - Patient declined Yes (MAU/Ambulatory/Procedural Areas -  Information given) Yes (MAU/Ambulatory/Procedural Areas - Information given) Yes (MAU/Ambulatory/Procedural Areas - Information given)  No - patient declined information      Data saved with a previous flowsheet row definition    Current Medications (verified) Outpatient Encounter Medications as of 11/30/2023  Medication Sig   acetaminophen  (TYLENOL ) 500 MG tablet Take 500 mg by mouth every 6 (six) hours as needed for moderate pain (pain score 4-6).   albuterol  (VENTOLIN  HFA) 108 (90 Base) MCG/ACT inhaler INHALE 2 PUFFS BY MOUTH EVERY 6 HOURS AS NEEDED FOR WHEEZING OR SHORTNESS OF BREATH   aspirin  EC 81 MG tablet Take 81 mg by mouth daily.   budesonide -glycopyrrolate-formoterol  (BREZTRI  AEROSPHERE) 160-9-4.8 MCG/ACT AERO inhaler Inhale 2 puffs into the lungs 2 (two) times daily.   cetirizine (ZYRTEC) 10 MG tablet Take 10 mg by mouth daily.   folic acid  (FOLVITE ) 1 MG tablet Take 1 tablet (1 mg total) by mouth daily.   methotrexate  (RHEUMATREX) 2.5 MG tablet Take 4 tablets (10 mg total) by mouth once a week. Caution:Chemotherapy. Protect from light.   metoprolol  succinate (TOPROL -XL) 25 MG 24 hr tablet Take 1 tablet (25 mg total) by mouth daily.   rosuvastatin  (CRESTOR ) 20 MG tablet Take 1 tablet (20 mg total) by mouth daily.   triamcinolone  ointment (KENALOG ) 0.1 % Apply 1 Application topically 2 (two) times daily as needed (psoriasis).   No facility-administered encounter medications on file as of 11/30/2023.    Allergies (verified) Flonase  [fluticasone  propionate] and Penicillins   History: Past Medical History:  Diagnosis Date  CAD (coronary artery disease)    a. 03/2014 NSTEMI - initially refused cath;  b. 03/2014 Echo: EF 55-60%;  c. 04/2014 Cath: LM nl, LAD 20-30p, 73m, D1 30-40, D2 small, 60-70, LCX nl, RCA 100p (CTO), L->R collaterals, EF 55-60%-->Med Rx.   COPD (chronic obstructive pulmonary disease) (HCC)    History of tobacco abuse    a. 40+ pack years, quit 03/2014.    Hyperlipidemia    Morbid obesity (HCC)    Peripheral arterial disease (HCC)    Peripheral vascular disease (HCC)    Psoriasis    Past Surgical History:  Procedure Laterality Date   ABDOMINAL AORTOGRAM W/LOWER EXTREMITY N/A 06/07/2023   Procedure: ABDOMINAL AORTOGRAM W/LOWER EXTREMITY;  Surgeon: Serene Gaile ORN, MD;  Location: [facility];  Service: Cardiovascular;  Laterality: N/A;   CATARACT EXTRACTION, BILATERAL     ENDOVASCULAR REPAIR OF POPLITEAL ARTERY ANEURYSM  06/07/2023   Procedure: ENDOVASCULAR REPAIR OF POPLITEAL ARTERY ANEURYSM;  Surgeon: Serene Gaile ORN, MD;  Location: [facility];  Service: Cardiovascular;;  stent and angioplasty to left popliteal   LEFT HEART CATHETERIZATION WITH CORONARY ANGIOGRAM N/A 05/06/2014   Procedure: LEFT HEART CATHETERIZATION WITH CORONARY ANGIOGRAM;  Surgeon: Alm ORN Clay, MD;  Location: [facility] CATH LAB;  Service: Cardiovascular;  Laterality: N/A;   PERIPHERAL VASCULAR ULTRASOUND/IVUS  06/07/2023   Procedure: Peripheral Vascular Ultrasound/IVUS;  Surgeon: Serene Gaile ORN, MD;  Location: [facility];  Service: Cardiovascular;;   SPINE SURGERY     Family History  Problem Relation Age of Onset   Stroke Mother 11   Allergies Mother    Asthma Mother    Cancer Mother        colon   Heart disease Father 48       quadruple bypass   Healthy Daughter    Healthy Daughter    Social History   Socioeconomic History   Marital status: Married    Spouse name: Erminio   Number of children: 2   Years of education: associates degree   Highest education level: Associate degree: occupational, Scientist, product/process development, or vocational program  Occupational History   Occupation: Retired    Comment: Wellsite geologist  Tobacco Use   Smoking status: Former    Current packs/day: 0.00    Average packs/day: 0.5 packs/day for 49.0 years (24.5 ttl pk-yrs)    Types: Cigarettes    Start date: 04/07/1965    Quit date: 04/07/2014    Years since  quitting: 9.6   Smokeless tobacco: Never  Vaping Use   Vaping status: Never Used  Substance and Sexual Activity   Alcohol use: No    Alcohol/week: 0.0 standard drinks of alcohol   Drug use: No   Sexual activity: Yes  Other Topics Concern   Not on file  Social History Narrative   Lives home with wife. Children live nearby   Social Drivers of Health   Financial Resource Strain: Low Risk  (11/30/2023)   Overall Financial Resource Strain (CARDIA)    Difficulty of Paying Living Expenses: Not hard at all  Food Insecurity: No Food Insecurity (11/30/2023)   Hunger Vital Sign    Worried About Running Out of Food in the Last Year: Never true    Ran Out of Food in the Last Year: Never true  Transportation Needs: No Transportation Needs (11/30/2023)   PRAPARE - Administrator, Civil Service (Medical): No    Lack of Transportation (Non-Medical): No  Physical Activity: Sufficiently Active (  11/30/2023)   Exercise Vital Sign    Days of Exercise per Week: 7 days    Minutes of Exercise per Session: 30 min  Stress: No Stress Concern Present (11/30/2023)   [employer] of Occupational Health - Occupational Stress Questionnaire    Feeling of Stress: Not at all  Social Connections: Socially Integrated (11/30/2023)   Social Connection and Isolation Panel    Frequency of Communication with Friends and Family: More than three times a week    Frequency of Social Gatherings with Friends and Family: More than three times a week    Attends Religious Services: More than 4 times per year    Active Member of [employer] or Organizations: Yes    Attends Banker Meetings: Never    Marital Status: Married    Tobacco Counseling Counseling given: Yes    Clinical Intake:  Pre-visit preparation completed: Yes  Pain : No/denies pain     BMI - recorded: 27.52 Nutritional Status: BMI 25 -29 Overweight Nutritional Risks: None Diabetes: No  Lab Results  Component Value Date   HGBA1C  5.7 04/21/2018   HGBA1C 5.9 10/17/2017   HGBA1C 5.4 05/05/2017     How often do you need to have someone help you when you read instructions, pamphlets, or other written materials from your doctor or pharmacy?: 1 - Never  Interpreter Needed?: No  Information entered by :: alia t/cma   Activities of Daily Living     11/30/2023    2:04 PM  In your present state of health, do you have any difficulty performing the following activities:  Hearing? 0  Vision? 0  Difficulty concentrating or making decisions? 0  Walking or climbing stairs? 0  Dressing or bathing? 0  Doing errands, shopping? 0  Preparing Food and eating ? N  Using the Toilet? N  In the past six months, have you accidently leaked urine? N  Do you have problems with loss of bowel control? N  Managing your Medications? N  Managing your Finances? N  Housekeeping or managing your Housekeeping? N    Patient Care Team: Zollie Lowers, MD as PCP - General (Family Medicine) Shlomo Wilbert SAUNDERS, MD as PCP - Cardiology (Cardiology) Ladora Ross Lacy Phebe, MD as Referring Physician (Optometry) Darlean Ozell NOVAK, MD as Consulting Physician (Pulmonary Disease)  I have updated your Care Teams any recent Medical Services you may have received from other providers in the past year.     Assessment:   This is a routine wellness examination for Ha.  Hearing/Vision screen Hearing Screening - Comments:: Pt have hearing aids Vision Screening - Comments:: Pt denies vision dif/Pt have not been to have eyes check in a while.   Goals Addressed             This Visit's Progress    EXERCISE FOR GENERAL HEALTH   On track    08/24/2019 AWV Goal: Exercise for General Health  Patient will verbalize understanding of the benefits of increased physical activity: Exercising regularly is important. It will improve your overall fitness, flexibility, and endurance. Regular exercise also will improve your overall health. It can help you control  your weight, reduce stress, and improve your bone density. Over the next year, patient will increase physical activity as tolerated with a goal of at least 150 minutes of moderate physical activity per week.  You can tell that you are exercising at a moderate intensity if your heart starts beating faster and you start breathing faster  but can still hold a conversation. Moderate-intensity exercise ideas include: Walking 1 mile (1.6 km) in about 15 minutes Biking Hiking Golfing Dancing Water aerobics Patient will verbalize understanding of everyday activities that increase physical activity by providing examples like the following: Yard work, such as: Insurance underwriter Gardening Washing windows or floors Patient will be able to explain general safety guidelines for exercising:  Before you start a new exercise program, talk with your health care provider. Do not exercise so much that you hurt yourself, feel dizzy, or get very short of breath. Wear comfortable clothes and wear shoes with good support. Drink plenty of water while you exercise to prevent dehydration or heat stroke. Work out until your breathing and your heartbeat get faster.        Depression Screen     11/30/2023    2:07 PM 04/18/2023    1:03 PM 10/13/2022    2:34 PM 06/16/2022   12:08 PM 04/05/2022    2:38 PM 03/15/2022   10:47 AM 09/01/2021   10:11 AM  PHQ 2/9 Scores  PHQ - 2 Score 0 0 0 0 0 0 3  PHQ- 9 Score     0  3    Fall Risk     11/30/2023    2:01 PM 04/18/2023    1:03 PM 10/13/2022    2:42 PM 10/13/2022    2:34 PM 06/16/2022   12:07 PM  Fall Risk   Falls in the past year? 0 0 1 0 0  Number falls in past yr: 0  0  0  Injury with Fall? 0  1  0  Risk for fall due to : No Fall Risks  History of fall(s)  No Fall Risks  Follow up Falls evaluation completed  Falls evaluation completed  Falls prevention discussed    MEDICARE  RISK AT HOME:  Medicare Risk at Home Any stairs in or around the home?: No If so, are there any without handrails?: No Home free of loose throw rugs in walkways, pet beds, electrical cords, etc?: Yes Adequate lighting in your home to reduce risk of falls?: Yes Life alert?: No Use of a cane, walker or w/c?: No Grab bars in the bathroom?: Yes Shower chair or bench in shower?: No Elevated toilet seat or a handicapped toilet?: Yes  TIMED UP AND GO:  Was the test performed?  no  Cognitive Function: 6CIT completed    07/25/2017   11:13 AM  MMSE - Mini Mental State Exam  Orientation to time 5  Orientation to Place 5  Registration 3  Attention/ Calculation 5  Recall 2  Language- name 2 objects 2  Language- repeat 1  Language- follow 3 step command 3  Language- read & follow direction 1  Write a sentence 1  Copy design 1  Total score 29        11/30/2023    2:07 PM 06/16/2022   12:10 PM 08/24/2019   11:03 AM 08/23/2018    9:17 AM  6CIT Screen  What Year? 0 points 0 points 0 points 0 points  What month? 0 points 0 points 0 points 0 points  What time? 0 points 0 points 0 points 0 points  Count back from 20 0 points 0 points 0 points 0 points  Months in reverse 0 points 0 points 0 points 0 points  Repeat phrase 0 points 0 points 0 points 0  points  Total Score 0 points 0 points 0 points 0 points    Immunizations Immunization History  Administered Date(s) Administered   Influenza, High Dose Seasonal PF 04/04/2015   Moderna Sars-Covid-2 Vaccination 05/16/2019, 06/13/2019, 04/23/2020   Pneumococcal Conjugate-13 04/04/2015   Pneumococcal Polysaccharide-23 10/17/2017    Screening Tests Health Maintenance  Topic Date Due   Lung Cancer Screening  04/14/2015   INFLUENZA VACCINE  11/25/2023   Zoster Vaccines- Shingrix (1 of 2) 12/27/2023 (Originally 11/02/1996)   DTaP/Tdap/Td (1 - Tdap) 04/17/2024 (Originally 11/02/1965)   COVID-19 Vaccine (4 - 2024-25 season) 12/15/2024  (Originally 12/26/2022)   Medicare Annual Wellness (AWV)  11/29/2024   Pneumococcal Vaccine: 50+ Years  Completed   Hepatitis C Screening  Completed   Hepatitis B Vaccines  Aged Out   HPV VACCINES  Aged Out   Meningococcal B Vaccine  Aged Out    Health Maintenance  Health Maintenance Due  Topic Date Due   Lung Cancer Screening  04/14/2015   INFLUENZA VACCINE  11/25/2023   Health Maintenance Items Addressed: See Nurse Notes at the end of this note  Additional Screening:  Vision Screening: Recommended annual ophthalmology exams for early detection of glaucoma and other disorders of the eye. Would you like a referral to an eye doctor? No    Dental Screening: Recommended annual dental exams for proper oral hygiene  Community Resource Referral / Chronic Care Management: CRR required this visit?  No   CCM required this visit?  No   Plan:    I have personally reviewed and noted the following in the patient's chart:   Medical and social history Use of alcohol, tobacco or illicit drugs  Current medications and supplements including opioid prescriptions. Patient is not currently taking opioid prescriptions. Functional ability and status Nutritional status Physical activity Advanced directives List of other physicians Hospitalizations, surgeries, and ER visits in previous 12 months Vitals Screenings to include cognitive, depression, and falls Referrals and appointments  In addition, I have reviewed and discussed with patient certain preventive protocols, quality metrics, and best practice recommendations. A written personalized care plan for preventive services as well as general preventive health recommendations were provided to patient.   Ozie Ned, CMA   11/30/2023   After Visit Summary: (MyChart) Due to this being a telephonic visit, the after visit summary with patients personalized plan was offered to patient via MyChart   Notes: Nothing significant to report at  this time.

## 2023-12-09 ENCOUNTER — Other Ambulatory Visit: Payer: Self-pay

## 2023-12-09 DIAGNOSIS — I724 Aneurysm of artery of lower extremity: Secondary | ICD-10-CM

## 2023-12-15 ENCOUNTER — Encounter: Payer: Self-pay | Admitting: Family Medicine

## 2023-12-15 ENCOUNTER — Ambulatory Visit: Admitting: Family Medicine

## 2023-12-15 VITALS — BP 129/64 | HR 54 | Temp 97.5°F | Ht 68.0 in | Wt 179.0 lb

## 2023-12-15 DIAGNOSIS — S51812A Laceration without foreign body of left forearm, initial encounter: Secondary | ICD-10-CM | POA: Diagnosis not present

## 2023-12-15 DIAGNOSIS — Z23 Encounter for immunization: Secondary | ICD-10-CM

## 2023-12-15 NOTE — Progress Notes (Signed)
 Subjective:  Patient ID: Joseph Shields., male    DOB: 08-Oct-1945, 78 y.o.   MRN: [id number]  Patient Care Team: Zollie Lowers, MD as PCP - General (Family Medicine) Shlomo Wilbert SAUNDERS, MD as PCP - Cardiology (Cardiology) Ladora Ross Lacy Phebe, MD as Referring Physician (Optometry) Darlean Ozell NOVAK, MD as Consulting Physician (Pulmonary Disease)   Chief Complaint:  skin tare (Left arm skin tare due to tree branch hitting arm)   HPI: Joseph Shields. is a 78 y.o. male presenting on 12/15/2023 for skin tare (Left arm skin tare due to tree branch hitting arm)   Discussed the use of AI scribe software for clinical note transcription with the patient, who gave verbal consent to proceed.  History of Present Illness   Joseph Shields. Joseph Shields. is a 78 year old male who presents with a skin tear sustained while mowing.  He sustained a skin tear on his arm yesterday while mowing, caused by a 'little boy on a tree'. Initially, the skin flap was left intact but was later removed by someone else, resulting in significant bleeding.  The wound has been bleeding since the removal of the skin flap. He is concerned about the bleeding and the need for proper wound care, expressing uncertainty about how to manage the wound effectively.  His last tetanus shot was at the age of [age], indicating it has not been updated recently.          Relevant past medical, surgical, family, and social history reviewed and updated as indicated.  Allergies and medications reviewed and updated. Data reviewed: Chart in Epic.   Past Medical History:  Diagnosis Date   CAD (coronary artery disease)    a. 03/2014 NSTEMI - initially refused cath;  b. 03/2014 Echo: EF 55-60%;  c. 04/2014 Cath: LM nl, LAD 20-30p, 76m, D1 30-40, D2 small, 60-70, LCX nl, RCA 100p (CTO), L->R collaterals, EF 55-60%-->Med Rx.   COPD (chronic obstructive pulmonary disease) (HCC)    History of tobacco abuse    a. 40+ pack years, quit 03/2014.    Hyperlipidemia    Morbid obesity (HCC)    Peripheral arterial disease (HCC)    Peripheral vascular disease (HCC)    Psoriasis     Past Surgical History:  Procedure Laterality Date   ABDOMINAL AORTOGRAM W/LOWER EXTREMITY N/A 06/07/2023   Procedure: ABDOMINAL AORTOGRAM W/LOWER EXTREMITY;  Surgeon: Serene Gaile ORN, MD;  Location: [facility]AS[facility] CV [facility];  Service: Cardiovascular;  Laterality: N/A;   CATARACT EXTRACTION, BILATERAL     ENDOVASCULAR REPAIR OF POPLITEAL ARTERY ANEURYSM  06/07/2023   Procedure: ENDOVASCULAR REPAIR OF POPLITEAL ARTERY ANEURYSM;  Surgeon: Serene Gaile ORN, MD;  Location: [facility];  Service: Cardiovascular;;  stent and angioplasty to left popliteal   LEFT HEART CATHETERIZATION WITH CORONARY ANGIOGRAM N/A 05/06/2014   Procedure: LEFT HEART CATHETERIZATION WITH CORONARY ANGIOGRAM;  Surgeon: Alm ORN Clay, MD;  Location: [facility] CATH LAB;  Service: Cardiovascular;  Laterality: N/A;   PERIPHERAL VASCULAR ULTRASOUND/IVUS  06/07/2023   Procedure: Peripheral Vascular Ultrasound/IVUS;  Surgeon: Serene Gaile ORN, MD;  Location: [facility];  Service: Cardiovascular;;   SPINE SURGERY      Social History   Socioeconomic History   Marital status: Married    Spouse name: Erminio   Number of children: 2   Years of education: associates degree   Highest education level: Associate degree: occupational, Scientist, product/process development, or vocational program  Occupational History  Occupation: Retired    Comment: Wellsite geologist  Tobacco Use   Smoking status: Former    Current packs/day: 0.00    Average packs/day: 0.5 packs/day for 49.0 years (24.5 ttl pk-yrs)    Types: Cigarettes    Start date: 04/07/1965    Quit date: 04/07/2014    Years since quitting: 9.6   Smokeless tobacco: Never  Vaping Use   Vaping status: Never Used  Substance and Sexual Activity   Alcohol use: No    Alcohol/week: 0.0 standard drinks of alcohol   Drug use: No   Sexual activity: Yes  Other  Topics Concern   Not on file  Social History Narrative   Lives home with wife. Children live nearby   Social Drivers of Health   Financial Resource Strain: Low Risk  (11/30/2023)   Overall Financial Resource Strain (CARDIA)    Difficulty of Paying Living Expenses: Not hard at all  Food Insecurity: No Food Insecurity (11/30/2023)   Hunger Vital Sign    Worried About Running Out of Food in the Last Year: Never true    Ran Out of Food in the Last Year: Never true  Transportation Needs: No Transportation Needs (11/30/2023)   PRAPARE - Administrator, Civil Service (Medical): No    Lack of Transportation (Non-Medical): No  Physical Activity: Sufficiently Active (11/30/2023)   Exercise Vital Sign    Days of Exercise per Week: 7 days    Minutes of Exercise per Session: 30 min  Stress: No Stress Concern Present (11/30/2023)   Joseph Shields    Feeling of Stress: Not at all  Social Connections: Socially Integrated (11/30/2023)   Social Connection and Isolation Panel    Frequency of Communication with Friends and Family: More than three times a week    Frequency of Social Gatherings with Friends and Family: More than three times a week    Attends Religious Services: More than 4 times per year    Active Member of [employer] or Organizations: Yes    Attends Banker Meetings: Never    Marital Status: Married  Catering manager Violence: Not At Risk (11/30/2023)   Humiliation, Afraid, Rape, and Kick Shields    Fear of Current or Ex-Partner: No    Emotionally Abused: No    Physically Abused: No    Sexually Abused: No    Outpatient Encounter Medications as of 12/15/2023  Medication Sig   acetaminophen  (TYLENOL ) 500 MG tablet Take 500 mg by mouth every 6 (six) hours as needed for moderate pain (pain score 4-6).   albuterol  (VENTOLIN  HFA) 108 (90 Base) MCG/ACT inhaler INHALE 2 PUFFS BY MOUTH EVERY 6 HOURS AS NEEDED FOR  WHEEZING OR SHORTNESS OF BREATH   aspirin  EC 81 MG tablet Take 81 mg by mouth daily.   budesonide -glycopyrrolate-formoterol  (BREZTRI  AEROSPHERE) 160-9-4.8 MCG/ACT AERO inhaler Inhale 2 puffs into the lungs 2 (two) times daily.   cetirizine (ZYRTEC) 10 MG tablet Take 10 mg by mouth daily.   folic acid  (FOLVITE ) 1 MG tablet Take 1 tablet (1 mg total) by mouth daily.   methotrexate  (RHEUMATREX) 2.5 MG tablet Take 4 tablets (10 mg total) by mouth once a week. Caution:Chemotherapy. Protect from light.   metoprolol  succinate (TOPROL -XL) 25 MG 24 hr tablet Take 1 tablet (25 mg total) by mouth daily.   rosuvastatin  (CRESTOR ) 20 MG tablet Take 1 tablet (20 mg total) by mouth daily.   triamcinolone  ointment (KENALOG ) 0.1 %  Apply 1 Application topically 2 (two) times daily as needed (psoriasis).   No facility-administered encounter medications on file as of 12/15/2023.    Allergies  Allergen Reactions   Flonase  [Fluticasone  Propionate] Other (See Comments)    Nose bleeds.   Penicillins Other (See Comments)    unknown    Pertinent ROS per HPI, otherwise unremarkable      Objective:  BP 129/64   Pulse (!) 54   Temp (!) 97.5 F (36.4 C)   Ht 5' 8 (1.727 m)   Wt 179 lb (81.2 kg)   SpO2 93%   BMI 27.22 kg/m    Wt Readings from Last 3 Encounters:  12/15/23 179 lb (81.2 kg)  11/30/23 181 lb (82.1 kg)  11/02/23 181 lb (82.1 kg)    Physical Exam Vitals and nursing note reviewed.  Constitutional:      General: He is not in acute distress.    Appearance: Normal appearance. He is not ill-appearing, toxic-appearing or diaphoretic.  HENT:     Head: Normocephalic and atraumatic.     Mouth/Throat:     Mouth: Mucous membranes are moist.  Eyes:     Pupils: Pupils are equal, round, and reactive to light.  Cardiovascular:     Rate and Rhythm: Normal rate.     Pulses: Normal pulses.  Pulmonary:     Effort: Pulmonary effort is normal.  Skin:    General: Skin is warm and dry.     Capillary  Refill: Capillary refill takes less than 2 seconds.      Neurological:     General: No focal deficit present.     Mental Status: He is alert. He is disoriented.  Psychiatric:        Mood and Affect: Mood normal.        Behavior: Behavior normal.        Thought Content: Thought content normal.        Judgment: Judgment normal.     Results for orders placed or performed in visit on 11/02/23  CMP14+EGFR   Collection Time: 11/02/23  4:00 PM  Result Value Ref Range   Glucose 95 70 - 99 mg/dL   BUN 17 8 - 27 mg/dL   Creatinine, Ser 8.94 0.76 - 1.27 mg/dL   eGFR 74 >40 fO/fpw/8.26   BUN/Creatinine Ratio 16 10 - 24   Sodium 143 134 - 144 mmol/L   Potassium 4.7 3.5 - 5.2 mmol/L   Chloride 106 96 - 106 mmol/L   CO2 22 20 - 29 mmol/L   Calcium  9.2 8.6 - 10.2 mg/dL   Total Protein 6.6 6.0 - 8.5 g/dL   Albumin 4.1 3.8 - 4.8 g/dL   Globulin, Total 2.5 1.5 - 4.5 g/dL   Bilirubin Total 0.4 0.0 - 1.2 mg/dL   Alkaline Phosphatase 59 44 - 121 IU/L   AST 30 0 - 40 IU/L   ALT 23 0 - 44 IU/L       Pertinent labs & imaging results that were available during my care of the patient were reviewed by me and considered in my medical decision making.  Assessment & Plan:  Joseph Maertens. was seen today for skin tare.  Diagnoses and all orders for this visit:  Skin tear of forearm without complication, left, initial encounter -     Td : Tetanus/diphtheria >7yo Preservative  free      Skin tear of left forearm Acute skin tear sustained while mowing, exacerbated by removal of a dry  bandage, leading to capillary bleeding. Tetanus immunization status outdated, last received at age [age]. - Remove the flap of skin from the tear. - Apply a non-stick dressing (Adaptic) to the wound. - Provide additional non-stick dressings for home use. - Instruct to change the dressing once daily, gently wash with soap and water, and reapply dressing. - Administer tetanus booster.          Continue all other  maintenance medications.  Follow up plan: Return if symptoms worsen or fail to improve.   Continue healthy lifestyle choices, including diet (rich in fruits, vegetables, and lean proteins, and low in salt and simple carbohydrates) and exercise (at least 30 minutes of moderate physical activity daily).   The above assessment and management plan was discussed with the patient. The patient verbalized understanding of and has agreed to the management plan. Patient is aware to call the clinic if they develop any new symptoms or if symptoms persist or worsen. Patient is aware when to return to the clinic for a follow-up visit. Patient educated on when it is appropriate to go to the emergency department.   Rosaline Bruns, FNP-C [facility] [phone]

## 2024-01-23 ENCOUNTER — Ambulatory Visit (HOSPITAL_COMMUNITY)
Admission: RE | Admit: 2024-01-23 | Discharge: 2024-01-23 | Disposition: A | Discharge status: Home / Self Care | Source: Ambulatory Visit | Attending: Surgery | Admitting: Surgery

## 2024-01-23 ENCOUNTER — Ambulatory Visit: Admitting: Surgery

## 2024-01-23 ENCOUNTER — Encounter: Payer: Self-pay | Admitting: Surgery

## 2024-01-23 ENCOUNTER — Ambulatory Visit (HOSPITAL_COMMUNITY)
Admission: RE | Admit: 2024-01-23 | Discharge: 2024-01-23 | Disposition: A | Source: Ambulatory Visit | Attending: Surgery | Admitting: Surgery

## 2024-01-23 VITALS — BP 157/79 | HR 72 | Temp 98.4°F | Ht 68.0 in | Wt 182.0 lb

## 2024-01-23 DIAGNOSIS — I724 Aneurysm of artery of lower extremity: Secondary | ICD-10-CM | POA: Insufficient documentation

## 2024-01-23 LAB — VAS US ABI WITH/WO TBI
Left ABI: 1.01
Right ABI: 1.13

## 2024-01-23 NOTE — Progress Notes (Signed)
 [facility] [place]  Patient name: Joseph Shields. MRN: [id number] DOB: 03/14/1946 Sex: male   REASON FOR VISIT:    Follow up  HISOTRY OF PRESENT ILLNESS:    Joseph Shields. is a 78 y.o. 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  It measured 2.5 cm.  I sent him for CT scan to better define his anatomy.  On CT scan it measured 2.6 cm with circumferential thrombus.  He underwent repair on 06/07/2023 using an 8 x 150 Viabahn.  He has no complaints today   Patient has a history of coronary artery disease. He is status post NSTEMI in 2015. He has a history of COPD from former tobacco abuse. He is on a statin for hypercholesterolemia.   PAST MEDICAL HISTORY:   Past Medical History:  Diagnosis Date   CAD (coronary artery disease)    a. 03/2014 NSTEMI - initially refused cath;  b. 03/2014 Echo: EF 55-60%;  c. 04/2014 Cath: LM nl, LAD 20-30p, 66m, D1 30-40, D2 small, 60-70, LCX nl, RCA 100p (CTO), L->R collaterals, EF 55-60%-->Med Rx.   COPD (chronic obstructive pulmonary disease) (HCC)    History of tobacco abuse    a. 40+ pack years, quit 03/2014.   Hyperlipidemia    Morbid obesity (HCC)    Peripheral arterial disease    Peripheral vascular disease    Psoriasis      FAMILY HISTORY:   Family History  Problem Relation Age of Onset   Stroke Mother 41   Allergies Mother    Asthma Mother    Cancer Mother        colon   Heart disease Father 71       quadruple bypass   Healthy Daughter    Healthy Daughter     SOCIAL HISTORY:   Social History   Tobacco Use   Smoking status: Former    Current packs/day: 0.00    Average packs/day: 0.5 packs/day for 49.0 years (24.5 ttl pk-yrs)    Types: Cigarettes    Start date: 04/07/1965    Quit date: 04/07/2014    Years since quitting: 9.8   Smokeless tobacco: Never  Substance Use Topics   Alcohol  use: No    Alcohol/week: 0.0 standard drinks of alcohol     ALLERGIES:   Allergies  Allergen Reactions   Flonase  [Fluticasone  Propionate] Other (See Comments)    Nose bleeds.   Penicillins Other (See Comments)    unknown     CURRENT MEDICATIONS:   Current Outpatient Medications  Medication Sig Dispense Refill   acetaminophen  (TYLENOL ) 500 MG tablet Take 500 mg by mouth every 6 (six) hours as needed for moderate pain (pain score 4-6).     albuterol  (VENTOLIN  HFA) 108 (90 Base) MCG/ACT inhaler INHALE 2 PUFFS BY MOUTH EVERY 6 HOURS AS NEEDED FOR WHEEZING OR SHORTNESS OF BREATH 18 g 0   aspirin  EC 81 MG tablet Take 81 mg by mouth daily.     budesonide -glycopyrrolate-formoterol  (BREZTRI  AEROSPHERE) 160-9-4.8 MCG/ACT AERO inhaler Inhale 2 puffs into the lungs 2 (two) times daily. 3 each 3   cetirizine (ZYRTEC) 10 MG tablet Take 10 mg by mouth daily.     folic acid  (FOLVITE ) 1 MG tablet Take 1 tablet (1 mg total) by mouth daily. 90 tablet 3   methotrexate  (RHEUMATREX) 2.5 MG tablet Take 4 tablets (10 mg total) by mouth once  a week. Caution:Chemotherapy. Protect from light. 16 tablet 5   metoprolol  succinate (TOPROL -XL) 25 MG 24 hr tablet Take 1 tablet (25 mg total) by mouth daily. 90 tablet 3   rosuvastatin  (CRESTOR ) 20 MG tablet Take 1 tablet (20 mg total) by mouth daily. 90 tablet 3   triamcinolone  ointment (KENALOG ) 0.1 % Apply 1 Application topically 2 (two) times daily as needed (psoriasis).     No current facility-administered medications for this visit.    REVIEW OF SYSTEMS:   [X]  denotes positive finding, [ ]  denotes negative finding Cardiac  Comments:  Chest pain or chest pressure:    Shortness of breath upon exertion:    Short of breath when lying flat:    Irregular heart rhythm:        Vascular    Pain in calf, thigh, or hip brought on by ambulation:    Pain in feet at night that wakes you up from your sleep:     Blood clot in your veins:    Leg swelling:          Pulmonary    Oxygen  at home:    Productive cough:     Wheezing:         Neurologic    Sudden weakness in arms or legs:     Sudden numbness in arms or legs:     Sudden onset of difficulty speaking or slurred speech:    Temporary loss of vision in one eye:     Problems with dizziness:         Gastrointestinal    Blood in stool:     Vomited blood:         Genitourinary    Burning when urinating:     Blood in urine:        Psychiatric    Major depression:         Hematologic    Bleeding problems:    Problems with blood clotting too easily:        Skin    Rashes or ulcers:        Constitutional    Fever or chills:      PHYSICAL EXAM:   Vitals:   01/23/24 1356  BP: (!) 157/79  Pulse: 72  Temp: 98.4 F (36.9 C)  Weight: 182 lb (82.6 kg)  Height: 5' 8 (1.727 m)    GENERAL: The patient is a well-nourished male, in no acute distress. The vital signs are documented above. CARDIAC: There is a regular rate and rhythm.  PULMONARY: Non-labored respirations ABDOMEN: Soft and non-tender  MUSCULOSKELETAL: There are no major deformities or cyanosis. NEUROLOGIC: No focal weakness or paresthesias are detected. SKIN: There are no ulcers or rashes noted. PSYCHIATRIC: The patient has a normal affect.  STUDIES:   I have reviewed the following:  -------+-----------+-----------+------------+------------+  ABI/TBIToday's ABIToday's TBIPrevious ABIPrevious TBI  +-------+-----------+-----------+------------+------------+  Right 1.13       0.88       0.99        0.7           +-------+-----------+-----------+------------+------------+  Left  1.01       0.72       1.09        0.76          +-------+-----------+-----------+------------+------------+  Left: Patent stent with low flow noted within.  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  His ABIs are normal with triphasic waveforms.  The stent is widely  patent however there  is decreased velocities within the stent.  I suspect this is related to his cardiac disease as there was sluggish flow through the native artery on angiography.  I will continue to monitor this.  He does note to go to the emergency department should he develop acute symptoms.  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 I will plan for ultrasound in 6 months when he returns    Malvina New, IV, MD, FACS [facility] [phone] Pager [phone]

## 2024-01-25 ENCOUNTER — Ambulatory Visit: Admitting: Family Medicine

## 2024-01-25 VITALS — BP 113/61 | HR 51 | Temp 98.2°F | Ht 68.0 in | Wt 180.0 lb

## 2024-01-25 DIAGNOSIS — E782 Mixed hyperlipidemia: Secondary | ICD-10-CM | POA: Diagnosis not present

## 2024-01-25 DIAGNOSIS — J449 Chronic obstructive pulmonary disease, unspecified: Secondary | ICD-10-CM | POA: Diagnosis not present

## 2024-01-25 DIAGNOSIS — L409 Psoriasis, unspecified: Secondary | ICD-10-CM | POA: Diagnosis not present

## 2024-01-25 MED ORDER — BREZTRI AEROSPHERE 160-9-4.8 MCG/ACT IN AERO: 2.0000 | INHALATION_SPRAY | Freq: Two times a day (BID) | RESPIRATORY_TRACT | 3 refills | Status: AC

## 2024-01-25 NOTE — Progress Notes (Signed)
 Subjective:  Patient ID: Joseph Shields., male    DOB: [birth year]  Age: 78 y.o. MRN: [id number]  CC: Psoriasis (3 month follow up)   HPI  Discussed the use of AI scribe software for clinical note transcription with the patient, who gave verbal consent to proceed.  History of Present Illness Joseph Shields. Joseph Shields. is a 78 year old male who presents for follow-up and medication renewal.  He had a stent placed in his leg in February 2025 and was initially prescribed Plavix  for 30 days post-procedure. He continues to take aspirin  daily. He denies any swelling.  He is currently using Breztri  for his respiratory condition and requires a renewal of this medication. His previous insurance coverage was sufficient until now, and he is transitioning to new insurance. No shortness of breath with routine activities.  He has a history of a heart attack in 2015, which he attributes to severe bronchitis at the time. He denies experiencing any pain during the event and has never had high blood pressure or cholesterol issues. He is currently taking rosuvastatin  to help keep his stents open.  He is on methotrexate  for psoriasis, which is well-controlled with only a few small plaques remaining. If he stops the medication, the psoriasis tends to return within two weeks. No known arthritis and does not require a rheumatologist.  He recalls a past reaction to high-dose prednisone , which caused a significant increase in blood pressure. He has since tolerated lower doses without issue. He is due for blood work to monitor liver function due to methotrexate  use.          01/25/2024   12:51 PM 11/30/2023    2:07 PM 04/18/2023    1:03 PM  Depression screen PHQ 2/9  Decreased Interest 0 0 0  Down, Depressed, Hopeless 0 0 0  PHQ - 2 Score 0 0 0    History Joseph Shields has a past medical history of CAD (coronary artery disease), COPD (chronic obstructive pulmonary disease) (HCC), History of tobacco abuse,  Hyperlipidemia, Morbid obesity (HCC), Peripheral arterial disease, Peripheral vascular disease, and Psoriasis.   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   His family history includes Allergies in his mother; Asthma in his mother; Cancer in his mother; Healthy in his daughter and daughter; Heart disease (age of onset: 54) in his father; Stroke (age of onset: 30) in his mother.He reports that he quit smoking about 9 years ago. His smoking use included cigarettes. He started smoking about 58 years ago. He has a 24.5 pack-year smoking history. He has never used smokeless tobacco. He reports that he does not drink alcohol and does not use drugs.    ROS Review of Systems  Constitutional: Negative.   HENT: Negative.    Eyes:  Negative for visual disturbance.  Respiratory:  Negative for cough and shortness of breath.   Cardiovascular:  Negative for chest pain and leg swelling.  Gastrointestinal:  Negative for abdominal pain, diarrhea, nausea and vomiting.  Genitourinary:  Negative for difficulty urinating.  Musculoskeletal:  Negative for arthralgias and myalgias.  Skin:  Negative for rash.  Neurological:  Negative for headaches.  Psychiatric/Behavioral:  Negative for sleep disturbance.     Objective:  BP 113/61   Pulse (!) 51   Temp 98.2 F (36.8 C)  Ht 5' 8 (1.727 m)   Wt 180 lb (81.6 kg)   SpO2 91%   BMI 27.37 kg/m   BP Readings from Last 3 Encounters:  01/25/24 113/61  01/23/24 (!) 157/79  12/15/23 129/64    Wt Readings from Last 3 Encounters:  01/25/24 180 lb (81.6 kg)  01/23/24 182 lb (82.6 kg)  12/15/23 179 lb (81.2 kg)     Physical Exam Physical Exam VITALS: BP- 113/61 GENERAL:  Alert, cooperative, well developed, no acute distress HEENT: Normocephalic, normal oropharynx, moist mucous membranes CHEST: Clear to auscultation bilaterally, No wheezes, rhonchi, or crackles CARDIOVASCULAR: Normal heart rate and rhythm, S1 and S2 normal without murmurs ABDOMEN: Soft, non-tender, non-distended, without organomegaly, Normal bowel sounds EXTREMITIES: No cyanosis or edema NEUROLOGICAL: Cranial nerves grossly intact, Moves all extremities without gross motor or sensory deficit   Assessment & Plan:  Psoriasis  COPD GOLD 3 -     Breztri  Aerosphere; Inhale 2 puffs into the lungs 2 (two) times daily.  Dispense: 3 each; Refill: 3 -     CMP14+EGFR -     CBC with Differential/Platelet  Mixed hyperlipidemia    Assessment and Plan Assessment & Plan Adult Wellness Visit   He is doing well with blood pressure controlled at 113/61 mmHg and no new concerns. Perform blood work to check liver function due to methotrexate  use. Schedule a follow-up appointment in six months, potentially for a full physical.  Chronic obstructive pulmonary disease (COPD)   Managed with Breztri , he experiences no shortness of breath during routine activities. Insurance changes may affect medication cost, but advised against changing medication due to potential impact on breathing. Renew Breztri  prescription and send to [facility]. Monitor for any issues with insurance coverage affecting medication cost.  Psoriasis   Well-controlled with methotrexate , only a few minor plaques remain. No current need for a rheumatologist or dermatologist referral. Discussed potential future need for medication change. Methotrexate  is acknowledged as potentially dangerous but effective, emphasizing the importance of treatment adherence. Continue methotrexate  therapy. Monitor psoriasis symptoms and consider future dermatology referral if condition worsens.  Peripheral artery disease, status post stent placement   Stent placed in  February is functioning well. He is on lifelong aspirin  therapy, having previously used Plavix  for 30 days post-procedure. Continue aspirin  therapy indefinitely.  Hyperlipidemia   Managed with rosuvastatin , his last cholesterol check in June was satisfactory. No immediate need for repeat testing. Continue rosuvastatin  therapy and hold off on cholesterol testing until next visit unless otherwise indicated.       Follow-up: Return in about 6 months (around 07/25/2024) for [facility] physical.  Butler Der, M.D.

## 2024-01-26 ENCOUNTER — Other Ambulatory Visit: Payer: Self-pay | Admitting: *Deleted

## 2024-01-26 DIAGNOSIS — I7143 Infrarenal abdominal aortic aneurysm, without rupture: Secondary | ICD-10-CM

## 2024-01-26 DIAGNOSIS — I739 Peripheral vascular disease, unspecified: Secondary | ICD-10-CM

## 2024-01-26 DIAGNOSIS — I724 Aneurysm of artery of lower extremity: Secondary | ICD-10-CM

## 2024-01-26 LAB — CBC WITH DIFFERENTIAL/PLATELET
Basophils Absolute: 0 x10E3/uL (ref 0.0–0.2)
Basos: 0 %
EOS (ABSOLUTE): 0.3 x10E3/uL (ref 0.0–0.4)
Eos: 4 %
Hematocrit: 40.2 % (ref 37.5–51.0)
Hemoglobin: 12.9 g/dL — ABNORMAL LOW (ref 13.0–17.7)
Immature Grans (Abs): 0 x10E3/uL (ref 0.0–0.1)
Immature Granulocytes: 0 %
Lymphocytes Absolute: 1.7 x10E3/uL (ref 0.7–3.1)
Lymphs: 22 %
MCH: 32.2 pg (ref 26.6–33.0)
MCHC: 32.1 g/dL (ref 31.5–35.7)
MCV: 100 fL — ABNORMAL HIGH (ref 79–97)
Monocytes Absolute: 0.8 x10E3/uL (ref 0.1–0.9)
Monocytes: 11 %
Neutrophils Absolute: 4.8 x10E3/uL (ref 1.4–7.0)
Neutrophils: 63 %
Platelets: 155 x10E3/uL (ref 150–450)
RBC: 4.01 x10E6/uL — ABNORMAL LOW (ref 4.14–5.80)
RDW: 13.1 % (ref 11.6–15.4)
WBC: 7.7 x10E3/uL (ref 3.4–10.8)

## 2024-01-26 LAB — CMP14+EGFR
ALT: 20 IU/L (ref 0–44)
AST: 29 IU/L (ref 0–40)
Albumin: 4.1 g/dL (ref 3.8–4.8)
Alkaline Phosphatase: 55 IU/L (ref 47–123)
BUN/Creatinine Ratio: 19 (ref 10–24)
BUN: 20 mg/dL (ref 8–27)
Bilirubin Total: 0.7 mg/dL (ref 0.0–1.2)
CO2: 23 mmol/L (ref 20–29)
Calcium: 9.8 mg/dL (ref 8.6–10.2)
Chloride: 101 mmol/L (ref 96–106)
Creatinine, Ser: 1.03 mg/dL (ref 0.76–1.27)
Globulin, Total: 2.5 g/dL (ref 1.5–4.5)
Glucose: 91 mg/dL (ref 70–99)
Potassium: 5 mmol/L (ref 3.5–5.2)
Sodium: 139 mmol/L (ref 134–144)
Total Protein: 6.6 g/dL (ref 6.0–8.5)
eGFR: 75 mL/min/1.73 (ref >59)

## 2024-01-29 ENCOUNTER — Ambulatory Visit: Payer: Self-pay | Admitting: Family Medicine

## 2024-01-29 NOTE — Progress Notes (Signed)
 Hello Fre[place],  Your lab result is normal and/or stable.Some minor variations that are not significant are commonly marked abnormal, but do not represent any medical problem for you.  Best regards, Mechele Claude, M.D.

## 2024-05-01 ENCOUNTER — Other Ambulatory Visit: Payer: Self-pay | Admitting: Family Medicine

## 2024-05-01 ENCOUNTER — Other Ambulatory Visit: Payer: Self-pay | Admitting: Nurse Practitioner

## 2024-05-01 DIAGNOSIS — L409 Psoriasis, unspecified: Secondary | ICD-10-CM

## 2024-06-01 ENCOUNTER — Other Ambulatory Visit: Payer: Self-pay | Admitting: Nurse Practitioner

## 2024-06-01 DIAGNOSIS — L409 Psoriasis, unspecified: Secondary | ICD-10-CM

## 2024-07-16 ENCOUNTER — Other Ambulatory Visit (HOSPITAL_COMMUNITY)

## 2024-07-16 ENCOUNTER — Encounter (HOSPITAL_COMMUNITY)

## 2024-07-16 ENCOUNTER — Ambulatory Visit

## 2024-07-19 ENCOUNTER — Ambulatory Visit: Admitting: Internal Medicine

## 2024-07-25 ENCOUNTER — Encounter: Payer: Self-pay | Admitting: Family Medicine

## 2024-11-30 ENCOUNTER — Ambulatory Visit: Payer: Self-pay
